# Patient Record
Sex: Female | Born: 1945 | Race: White | Hispanic: No | Marital: Married | Smoking: Never smoker
Health system: Southern US, Community
[De-identification: ages and names within clinical notes are randomized; demographics above are authoritative.]

## PROBLEM LIST (undated history)

## (undated) DIAGNOSIS — K259 Gastric ulcer, unspecified as acute or chronic, without hemorrhage or perforation: Secondary | ICD-10-CM

## (undated) DIAGNOSIS — G43909 Migraine, unspecified, not intractable, without status migrainosus: Secondary | ICD-10-CM

## (undated) DIAGNOSIS — F329 Major depressive disorder, single episode, unspecified: Secondary | ICD-10-CM

## (undated) DIAGNOSIS — F32A Depression, unspecified: Secondary | ICD-10-CM

## (undated) DIAGNOSIS — M1990 Unspecified osteoarthritis, unspecified site: Secondary | ICD-10-CM

## (undated) DIAGNOSIS — E7800 Pure hypercholesterolemia, unspecified: Secondary | ICD-10-CM

## (undated) DIAGNOSIS — I10 Essential (primary) hypertension: Secondary | ICD-10-CM

## (undated) HISTORY — PX: ABDOMINAL HYSTERECTOMY: SHX81

## (undated) HISTORY — PX: ROTATOR CUFF REPAIR: SHX139

## (undated) HISTORY — PX: TRIGGER FINGER RELEASE: SHX641

## (undated) HISTORY — PX: CARPAL TUNNEL RELEASE: SHX101

---

## 2017-05-14 ENCOUNTER — Encounter (HOSPITAL_BASED_OUTPATIENT_CLINIC_OR_DEPARTMENT_OTHER): Payer: Self-pay | Admitting: Emergency Medicine

## 2017-05-14 ENCOUNTER — Emergency Department (HOSPITAL_BASED_OUTPATIENT_CLINIC_OR_DEPARTMENT_OTHER): Payer: Medicare Other

## 2017-05-14 ENCOUNTER — Other Ambulatory Visit: Payer: Self-pay

## 2017-05-14 ENCOUNTER — Emergency Department (HOSPITAL_BASED_OUTPATIENT_CLINIC_OR_DEPARTMENT_OTHER)
Admission: EM | Admit: 2017-05-14 | Discharge: 2017-05-14 | Disposition: A | Discharge status: Home / Self Care | Payer: Medicare Other | Attending: Emergency Medicine | Admitting: Emergency Medicine

## 2017-05-14 DIAGNOSIS — M546 Pain in thoracic spine: Secondary | ICD-10-CM | POA: Diagnosis not present

## 2017-05-14 DIAGNOSIS — I10 Essential (primary) hypertension: Secondary | ICD-10-CM | POA: Diagnosis not present

## 2017-05-14 DIAGNOSIS — Z79899 Other long term (current) drug therapy: Secondary | ICD-10-CM | POA: Diagnosis not present

## 2017-05-14 DIAGNOSIS — M549 Dorsalgia, unspecified: Secondary | ICD-10-CM | POA: Diagnosis present

## 2017-05-14 HISTORY — DX: Migraine, unspecified, not intractable, without status migrainosus: G43.909

## 2017-05-14 HISTORY — DX: Gastric ulcer, unspecified as acute or chronic, without hemorrhage or perforation: K25.9

## 2017-05-14 HISTORY — DX: Essential (primary) hypertension: I10

## 2017-05-14 HISTORY — DX: Depression, unspecified: F32.A

## 2017-05-14 HISTORY — DX: Major depressive disorder, single episode, unspecified: F32.9

## 2017-05-14 HISTORY — DX: Pure hypercholesterolemia, unspecified: E78.00

## 2017-05-14 LAB — BASIC METABOLIC PANEL
Anion gap: 9 (ref 5–15)
BUN: 16 mg/dL (ref 6–20)
CALCIUM: 9.3 mg/dL (ref 8.9–10.3)
CO2: 28 mmol/L (ref 22–32)
CREATININE: 0.69 mg/dL (ref 0.44–1.00)
Chloride: 104 mmol/L (ref 101–111)
GFR calc Af Amer: >60 mL/min (ref >60)
GFR calc non Af Amer: >60 mL/min (ref >60)
GLUCOSE: 112 mg/dL — AB (ref 65–99)
Potassium: 4.4 mmol/L (ref 3.5–5.1)
Sodium: 141 mmol/L (ref 135–145)

## 2017-05-14 LAB — CBC WITH DIFFERENTIAL/PLATELET
BASOS PCT: 1 %
Basophils Absolute: 0 10*3/uL (ref 0.0–0.1)
Eosinophils Absolute: 0.2 10*3/uL (ref 0.0–0.7)
Eosinophils Relative: 4 %
HEMATOCRIT: 41.7 % (ref 36.0–46.0)
Hemoglobin: 14.2 g/dL (ref 12.0–15.0)
Lymphocytes Relative: 22 %
Lymphs Abs: 1.4 10*3/uL (ref 0.7–4.0)
MCH: 31 pg (ref 26.0–34.0)
MCHC: 34.1 g/dL (ref 30.0–36.0)
MCV: 91 fL (ref 78.0–100.0)
MONOS PCT: 7 %
Monocytes Absolute: 0.5 10*3/uL (ref 0.1–1.0)
NEUTROS ABS: 4.2 10*3/uL (ref 1.7–7.7)
Neutrophils Relative %: 67 %
Platelets: 235 10*3/uL (ref 150–400)
RBC: 4.58 MIL/uL (ref 3.87–5.11)
RDW: 13.1 % (ref 11.5–15.5)
WBC: 6.3 10*3/uL (ref 4.0–10.5)

## 2017-05-14 LAB — TROPONIN I: Troponin I: <0.03 ng/mL (ref <0.03)

## 2017-05-14 MED ORDER — ONDANSETRON HCL 4 MG/2ML IJ SOLN
4.0000 mg | Freq: Once | INTRAMUSCULAR | Status: AC
  Administered 2017-05-14: 4 mg via INTRAVENOUS
  Filled 2017-05-14: qty 2

## 2017-05-14 MED ORDER — DIAZEPAM 2 MG PO TABS
2.0000 mg | ORAL_TABLET | Freq: Once | ORAL | Status: AC
  Administered 2017-05-14: 2 mg via ORAL
  Filled 2017-05-14: qty 1

## 2017-05-14 MED ORDER — DICLOFENAC SODIUM 1 % TD GEL: 4.0000 g | Freq: Four times a day (QID) | TRANSDERMAL | 0 refills | Status: DC

## 2017-05-14 MED ORDER — ACETAMINOPHEN 500 MG PO TABS
1000.0000 mg | ORAL_TABLET | Freq: Once | ORAL | Status: AC
  Administered 2017-05-14: 1000 mg via ORAL
  Filled 2017-05-14: qty 2

## 2017-05-14 MED ORDER — IOPAMIDOL (ISOVUE-300) INJECTION 61%: 100.0000 mL | Freq: Once | INTRAVENOUS | Status: DC | PRN

## 2017-05-14 MED ORDER — IOPAMIDOL (ISOVUE-370) INJECTION 76%
100.0000 mL | Freq: Once | INTRAVENOUS | Status: AC | PRN
  Administered 2017-05-14: 100 mL via INTRAVENOUS

## 2017-05-14 MED ORDER — SODIUM CHLORIDE 0.9 % IV BOLUS
1000.0000 mL | Freq: Once | INTRAVENOUS | Status: AC
  Administered 2017-05-14: 1000 mL via INTRAVENOUS

## 2017-05-14 MED ORDER — MORPHINE SULFATE (PF) 4 MG/ML IV SOLN
4.0000 mg | Freq: Once | INTRAVENOUS | Status: AC
  Administered 2017-05-14: 4 mg via INTRAVENOUS
  Filled 2017-05-14: qty 1

## 2017-05-14 MED ORDER — KETOROLAC TROMETHAMINE 15 MG/ML IJ SOLN
15.0000 mg | Freq: Once | INTRAMUSCULAR | Status: AC
  Administered 2017-05-14: 15 mg via INTRAVENOUS
  Filled 2017-05-14: qty 1

## 2017-05-14 MED ORDER — MORPHINE SULFATE (PF) 2 MG/ML IV SOLN
2.0000 mg | Freq: Once | INTRAVENOUS | Status: AC
  Administered 2017-05-14: 2 mg via INTRAVENOUS
  Filled 2017-05-14: qty 1

## 2017-05-14 NOTE — ED Provider Notes (Signed)
[facility] EMERGENCY DEPARTMENT Provider Note   CSN: [id number] Arrival date & time: 05/14/17  4098     History   Chief Complaint Chief Complaint  Patient presents with  . Back Pain    HPI Rachael Callahan is a 72 y.o. female.  72 yo F with a chief complaint of sudden onset right upper back pain.  Nothing seems to make this better or worse.  Describes the pain is sharp and severe.  Feels that it radiates down her right arm to the elbow.  Denies trauma denies fevers denies cough congestion.  Denies shortness of breath diaphoresis nausea or vomiting.  Denies lower extremity edema.  Denies history of the same.  The history is provided by the patient.  Back Pain   This is a new problem. The problem occurs constantly. The problem has not changed since onset.The pain is associated with no known injury. The pain is present in the thoracic spine. The quality of the pain is described as shooting. The pain does not radiate. The pain is at a severity of 10/10. The pain is severe. Pertinent negatives include no chest pain, no fever, no headaches and no dysuria. She has tried heat for the symptoms.    Past Medical History:  Diagnosis Date  . Depression   . High cholesterol   . Hypertension   . Migraine   . Stomach ulcer     There are no active problems to display for this patient.   Past Surgical History:  Procedure Laterality Date  . ABDOMINAL HYSTERECTOMY    . CARPAL TUNNEL RELEASE    . ROTATOR CUFF REPAIR       OB History   None      Home Medications    Prior to Admission medications   Medication Sig Start Date End Date Taking? Authorizing Provider  ARIPiprazole (ABILIFY) 5 MG tablet Take 5 mg by mouth daily.   Yes [provider]  metoprolol tartrate (LOPRESSOR) 25 MG tablet Take 25 mg by mouth 2 (two) times daily.   Yes [provider]  UNABLE TO FIND [employer]   Yes [provider]    Family History No family history on  file.  Social History Social History   Tobacco Use  . Smoking status: Never Smoker  . Smokeless tobacco: Never Used  Substance Use Topics  . Alcohol use: Yes    Comment: rarely  . Drug use: Never     Allergies   Patient has no known allergies.   Review of Systems Review of Systems  Constitutional: Negative for chills and fever.  HENT: Negative for congestion and rhinorrhea.   Eyes: Negative for redness and visual disturbance.  Respiratory: Negative for shortness of breath and wheezing.   Cardiovascular: Negative for chest pain and palpitations.  Gastrointestinal: Negative for nausea and vomiting.  Genitourinary: Negative for dysuria and urgency.  Musculoskeletal: Positive for back pain. Negative for arthralgias and myalgias.  Skin: Negative for pallor and wound.  Neurological: Negative for dizziness and headaches.     Physical Exam Updated Vital Signs BP (!) 158/83 (BP Location: Right Arm)   Pulse 63   Temp 98 F (36.7 C) (Oral)   Resp 18   Ht 5\' 6"  (1.676 m)   Wt 86.2 kg (190 lb)   SpO2 98%   BMI 30.67 kg/m   Physical Exam  Constitutional: She is oriented to person, place, and time. She appears well-developed and well-nourished. No distress.  HENT:  Head: Normocephalic  and atraumatic.  Eyes: Pupils are equal, round, and reactive to light. EOM are normal.  Neck: Normal range of motion. Neck supple.  Cardiovascular: Normal rate and regular rhythm. Exam reveals no gallop and no friction rub.  No murmur heard. Pulmonary/Chest: Effort normal. She has no wheezes. She has no rales.  Abdominal: Soft. She exhibits no distension. There is no tenderness.  Musculoskeletal: She exhibits no edema or tenderness.  The patient points to the area about T7.  There is no focal tenderness on palpation.  No pain with range of motion of the right shoulder.  Negative Spurling's, no midline cervical tenderness.  Pulse motor and sensation is intact to the right upper extremity   Neurological: She is alert and oriented to person, place, and time.  Skin: Skin is warm and dry. She is not diaphoretic.  Psychiatric: She has a normal mood and affect. Her behavior is normal.  Nursing note and vitals reviewed.    ED Treatments / Results  Labs (all labs ordered are listed, but only abnormal results are displayed) Labs Reviewed  BASIC METABOLIC PANEL - Abnormal; Notable for the following components:      Result Value   Glucose, Bld 112 (*)    All other components within normal limits  CBC WITH DIFFERENTIAL/PLATELET  TROPONIN I    EKG EKG Interpretation  Date/Time:  Thursday May 14 2017 08:53:33 EDT Ventricular Rate:  62 PR Interval:    QRS Duration: 91 QT Interval:  417 QTC Calculation: 424 R Axis:   6 Text Interpretation:  Sinus rhythm No old tracing to compare Confirmed by Melene PlanFloyd, Taila Basinski [phone]) on 05/14/2017 8:58:50 AM   Radiology No results found.  Procedures Procedures (including critical care time)  Medications Ordered in ED Medications  sodium chloride 0.9 % bolus 1,000 mL (1,000 mLs Intravenous New Bag/Given 05/14/17 0916)  morphine 2 MG/ML injection 2 mg (2 mg Intravenous Given 05/14/17 0923)  ondansetron (ZOFRAN) injection 4 mg (4 mg Intravenous Given 05/14/17 0923)  diazepam (VALIUM) tablet 2 mg (2 mg Oral Given 05/14/17 [id number])  acetaminophen (TYLENOL) tablet 1,000 mg (1,000 mg Oral Given 05/14/17 0921)  iopamidol (ISOVUE-370) 76 % injection 100 mL (100 mLs Intravenous Contrast Given 05/14/17 0949)     Initial Impression / Assessment and Plan / ED Course  I have reviewed the triage vital signs and the nursing notes.  Pertinent labs & imaging results that were available during my care of the patient were reviewed by me and considered in my medical decision making (see chart for details).     72 yo F with sudden onset severe back pain.  This is in the mid to upper T-spine.  The patient has no neurologic deficit.  I suspect that this is most  likely muscular skeletal based on its location however the patient is adamant that is not reproduced with palpation or twisting and based on her advanced age I will obtain a CT scan to evaluate for dissection or intrathoracic pathology.  CT scan is negative.  Patient feeling better after symptomatic therapy.  Will start on Voltaren gel Tylenol follow-up with PCP.  11:32 AM:  I have discussed the diagnosis/risks/treatment options with the patient and family and believe the pt to be eligible for discharge home to follow-up with PCP. We also discussed returning to the ED immediately if new or worsening sx occur. We discussed the sx which are most concerning (e.g., sudden worsening pain, fever, inability to tolerate by mouth) that necessitate immediate return. Medications administered  to the patient during their visit and any new prescriptions provided to the patient are listed below.  Medications given during this visit Medications  sodium chloride 0.9 % bolus 1,000 mL (0 mLs Intravenous Stopped 05/14/17 1111)  morphine 2 MG/ML injection 2 mg (2 mg Intravenous Given 05/14/17 0923)  ondansetron (ZOFRAN) injection 4 mg (4 mg Intravenous Given 05/14/17 0923)  diazepam (VALIUM) tablet 2 mg (2 mg Oral Given 05/14/17 1610)  acetaminophen (TYLENOL) tablet 1,000 mg (1,000 mg Oral Given 05/14/17 0921)  iopamidol (ISOVUE-370) 76 % injection 100 mL (100 mLs Intravenous Contrast Given 05/14/17 0949)  morphine 4 MG/ML injection 4 mg (4 mg Intravenous Given 05/14/17 1047)  ketorolac (TORADOL) 15 MG/ML injection 15 mg (15 mg Intravenous Given 05/14/17 1111)    Labs reviewed cbc, bmp, trop reassuring Images reviewed CT without acute finding as viewed by me DDX dissection, PE, epidural asbcess, disc herniation Old records reviewed hx of htn, hypothyroid  The patient appears reasonably screen and/or stabilized for discharge and I doubt any other medical condition or other [facility] requiring further screening, evaluation, or  treatment in the ED at this time prior to discharge.    Final Clinical Impressions(s) / ED Diagnoses   Final diagnoses:  Thoracic back pain    ED Discharge Orders    None       Melene Plan, DO 05/14/17 1134

## 2017-05-14 NOTE — ED Triage Notes (Signed)
Sudden onset 2 hours ago of back pain medial to right scapula radiating down right arm to elbow.  Some nausea.  Pt tearful.  No known injury.

## 2017-05-14 NOTE — Discharge Instructions (Signed)
Take tylenol 1000mg (2 extra strength) four times a day.   Ask your doctor and your hand surgeon if you can take NSAIDs

## 2017-07-15 ENCOUNTER — Encounter (HOSPITAL_COMMUNITY): Payer: Self-pay | Admitting: Psychiatry

## 2017-07-15 ENCOUNTER — Ambulatory Visit (INDEPENDENT_AMBULATORY_CARE_PROVIDER_SITE_OTHER): Payer: Medicare Other | Admitting: Psychiatry

## 2017-07-15 VITALS — BP 132/80 | HR 68 | Ht 66.0 in | Wt 182.0 lb

## 2017-07-15 DIAGNOSIS — G4700 Insomnia, unspecified: Secondary | ICD-10-CM | POA: Diagnosis not present

## 2017-07-15 DIAGNOSIS — F332 Major depressive disorder, recurrent severe without psychotic features: Secondary | ICD-10-CM | POA: Diagnosis not present

## 2017-07-15 DIAGNOSIS — R413 Other amnesia: Secondary | ICD-10-CM

## 2017-07-15 NOTE — Progress Notes (Signed)
Manuel Garcia MD/PA/NP OP [place] consult Note  07/15/2017 11:16 AM Rachael Callahan  MRN:  [id number]  Chief Complaint: [place] consult HPI: Rachael Callahan presents from referral from [facility] for [place] consult.  I spent time with her and her husband reviewing her psychiatric history and symptoms which are consistent with severe recurrent major depressive disorder.  She has not had any suicidality or psychiatric hospitalizations but has had passive thoughts about death and dying.  She feels like a burden to her husband at times even though she knows intellectually this is not the case.  She retired about 1 year ago and has had some struggles with the transition.  She has a good connection and support system with her friends and family but feels like she is not able to enjoy activities she typically likes.  Her depressive symptoms have persisted for the past 6-12 months without relief.  She has been tried on a myriad of medications for depression and is currently on Cymbalta and Latuda.  I spent time with her reviewing the risks and benefits of [place], and reviewed medical contraindications.  She has no metallic implants or other objects in her brain, neck, mouth that would preclude TMS to be safely administered.  I reviewed the risk of 1 out of 1000 patients may experience a seizure.  I reviewed the common complaints including pain at the treatment site, and neurological symptoms.  She wishes to proceed with [place] mapping and treatment, and we agreed to schedule for July 5 at 11 AM.  She will continue on the current regimen as prescribed and be [place] the next [place] the beach with [place].  She typically enjoys these types of trips but finds herself dreading the socialization, but knows that it is important for her family that she makes a trip.  Visit Diagnosis:    ICD-10-CM   1. Severe episode of recurrent major depressive disorder, without psychotic features ([place]) F33.2     Past Psychiatric History: Multiple  medical trials of medications for depression, no history of ECT or TMS, no psychiatric hospitalizations  Past Medical History:  Past Medical History:  Diagnosis Date  . Depression   . High cholesterol   . Hypertension   . Migraine   . Stomach ulcer     Past Surgical History:  Procedure Laterality Date  . ABDOMINAL HYSTERECTOMY    . CARPAL TUNNEL RELEASE    . ROTATOR CUFF REPAIR    . TRIGGER FINGER RELEASE      Family Psychiatric History: Her father has a history of severe depression and was treated with ECT  Family History: History reviewed. No pertinent family history.  Social History:  Social History   Socioeconomic History  . Marital status: Married    Spouse name: Not on file  . Number of children: Not on file  . Years of education: Not on file  . Highest education level: Not on file  Occupational History  . Not on file  Social Needs  . Financial resource strain: Not on file  . Food insecurity:    Worry: Not on file    Inability: Not on file  . Transportation needs:    Medical: Not on file    Non-medical: Not on file  Tobacco Use  . Smoking status: Never Smoker  . Smokeless tobacco: Never Used  Substance and Sexual Activity  . Alcohol use: Yes    Comment: rarely  . Drug use: Never  . Sexual activity: Not on file  Lifestyle  . Physical activity:    Days per week: Not on file    Minutes per session: Not on file  . Stress: Not on file  Relationships  . Social connections:    Talks on phone: Not on file    Gets together: Not on file    Attends religious service: Not on file    Active member of club or organization: Not on file    Attends meetings of clubs or organizations: Not on file    Relationship status: Not on file  Other Topics Concern  . Not on file  Social History Narrative  . Not on file    Allergies: No Known Allergies  Metabolic Disorder Labs: No results found for: HGBA1C, MPG No results found for: PROLACTIN No results found for:  CHOL, TRIG, HDL, CHOLHDL, VLDL, LDLCALC No results found for: TSH  Therapeutic Level Labs: No results found for: LITHIUM No results found for: VALPROATE No components found for:  CBMZ  Current Medications: Current Outpatient Medications  Medication Sig Dispense Refill  . ALPRAZolam (XANAX) 0.25 MG tablet Take 0.25 mg by mouth as needed.    . Cholecalciferol (D 1000) 1000 units capsule Take by mouth.    . DULoxetine (CYMBALTA) 20 MG capsule Take 60 mg by mouth daily.  0  . LATUDA 40 MG TABS tablet TAKE 1 TABLET EVERY EVENING WITH FOOD  0  . metoprolol tartrate (LOPRESSOR) 25 MG tablet Take 25 mg by mouth 2 (two) times daily.    . Multiple Vitamins-Minerals (MULTIVITAMIN ADULT PO) Take by mouth.    . Probiotic Product (PROBIOTIC-10 PO) Take by mouth.    . ARIPiprazole (ABILIFY) 5 MG tablet Take 5 mg by mouth daily.    . diclofenac sodium (VOLTAREN) 1 % GEL Apply 4 g topically 4 (four) times daily. (Patient not taking: Reported on 07/15/2017) 100 g 0   No current facility-administered medications for this visit.      Musculoskeletal: Strength & Muscle Tone: within normal limits Gait & Station: normal Patient leans: N/A  Psychiatric Specialty Exam: Review of Systems  Constitutional: Negative.   HENT: Negative.   Respiratory: Negative.   Cardiovascular: Negative.   Gastrointestinal: Negative.   Musculoskeletal: Negative.   Neurological: Negative.   Psychiatric/Behavioral: Positive for depression and memory loss. Negative for hallucinations, substance abuse and suicidal ideas. The patient has insomnia. The patient is not nervous/anxious.     Blood pressure 132/80, pulse 68, height '5\' 6"'  (1.676 m), weight 182 lb (82.6 kg).Body mass index is 29.38 kg/m.  General Appearance: Casual and Well Groomed  Eye Contact:  Good  Speech:  Clear and Coherent and Normal Rate  Volume:  Normal  Mood:  Depressed and Dysphoric  Affect:  Constricted, Depressed and Tearful  Thought Process:  Goal  Directed and Descriptions of Associations: Intact  Orientation:  Full (Time, Place, and Person)  Thought Content: Logical   Suicidal Thoughts:  No  Homicidal Thoughts:  No  Memory:  Immediate;   Good  Judgement:  Good  Insight:  Good  Psychomotor Activity:  Normal  Concentration:  Concentration: Good  Recall:  Good  Fund of Knowledge: Good  Language: Good  Akathisia:  Negative  Handed:  Right  AIMS (if indicated): not done  Assets:  Communication Skills Desire for Improvement Financial Resources/Insurance Housing  ADL's:  Intact  Cognition: WNL  Sleep:  Poor   Screenings:   Assessment and Plan: Rachael Callahan presents with symptoms consistent with severe recurrent major depressive  disorder, presenting with predominantly neurovegetative symptoms.  She presents with features of melancholic depression.  She has been tried and failed multiple antidepressants over the years.  She is managed by [facility] for her medication needs.  She has excellent support from her family and friends, and presents as a fairly resilient individual, but has been felt discouraged and feels like a burden to her husband given how depressed she has been over the past year.  We agreed to proceed with [place] and reviewed the common complaints and risk of seizure.  Spent time with her and her husband educating them on the modality of action of [place], and the 36 treatments recommended.  She has no acute suicidality or safety concerns.  She does not engage in any substance abuse.  We agreed to proceed as below and we will follow-up in August for a [place] posttreatment follow-up.  1. Severe episode of recurrent major depressive disorder, without psychotic features ([place])     Status of current problems: new to [employer] Ordered: No orders of the defined types were placed in this encounter.   Labs Reviewed: n/a  Collateral Obtained/Records Reviewed: husband is able to corroborate that the patient has had  severe depressive symptoms over the past 6-12 months, and failed multiple trials, no concerns about acute safety issues or suicidality.  Plan:  Proceed with [place] mapping and treatment Schedule for July 5 at [place], MD 07/15/2017, 11:16 AM

## 2017-07-31 ENCOUNTER — Other Ambulatory Visit (HOSPITAL_COMMUNITY): Payer: Medicare Other | Attending: Psychiatry | Admitting: Psychiatry

## 2017-07-31 DIAGNOSIS — F332 Major depressive disorder, recurrent severe without psychotic features: Secondary | ICD-10-CM | POA: Diagnosis present

## 2017-07-31 NOTE — Progress Notes (Signed)
[facility] for cortical mapping and motor threshold determination for Repetitive Transcranial Magnetic Stimulation treatment for Major Depressive Disorder. Pt completed a PHQ-9 with a score of 24(severe depression). Pt also completed a Beck's Depression Inventory with a score of 33 (severe depression). Prior to procedure, pt signed an informed consent agreement for TMS treatment. Pt's treatment area was found by applying single pulses to her left motor cortex, hunting along the anterior/posterior plane and along the superior oblique angle until the best motor response was elicited from the pt's right thumb. The best response was observed at 5.5cm A/P and 30 degrees SOA, with a coil angle of 10 degrees. Pt's motor threshold was calculated using the [employer]'s proprietary MT Assist algorithm, which produced a calculated motor threshold of 1.30 SMT. Per these findings, pt's treatment parameters are as follows: A/P -- 11 cm, SOA -- 30 degrees, Coil Angle -- 10 degrees, Motor Threshold -- 1.18 SMT. With these parameters, the pt will receive 36 sessions of TMS according to the following protocol: 3000 pulses per session, with stimulation in bursts of pulses lasting 4 seconds at a frequency of 10 Hz, separated by 26 seconds of rest. After determining pt's tx parameters, coil was moved to the treatment location, and the first burst of pulses was applied at a reduced power of 80%MT. Pt reported no complaints, and stated that the stimulation was tolerable. Upon completion of mapping, pt completed a few treatment intervals for observation of side effects. Pt tolerated tx well. Pt departed from clinic without issue.

## 2017-08-03 ENCOUNTER — Other Ambulatory Visit (INDEPENDENT_AMBULATORY_CARE_PROVIDER_SITE_OTHER): Payer: Medicare Other | Admitting: Emergency Medicine

## 2017-08-03 DIAGNOSIS — F332 Major depressive disorder, recurrent severe without psychotic features: Secondary | ICD-10-CM

## 2017-08-03 NOTE — Progress Notes (Signed)
[facility] for Repetitive Transcranial Magnetic Stimulation treatment for severe episode of recurrent major depressive disorder, without psychotic features. Patient presented with appropriate affect, level mood and denied any suicidal or homicidal ideations. Patient denies any other current symptoms and remains optimistic with continued TMS treatment. Patient reported no change in alcohol/substane use, caffeine consumption, sleep pattern or metal implant status since previous treatment. Ptpresented withpleasantaffect to txsession with her best friend. She shared that she had a relaxful weekend and has been busy planning a baby shower. She spoke with best friend and Clinical research associatewriter during tx session.Power levelremained at 120% for the duration of tx sesion.Patient reported nocomplaints anddiscomfort during tx.Patientand best friend departed post-treatment with no majorreported concerns.

## 2017-08-04 ENCOUNTER — Other Ambulatory Visit (HOSPITAL_COMMUNITY): Payer: Medicare Other | Admitting: Emergency Medicine

## 2017-08-04 DIAGNOSIS — F332 Major depressive disorder, recurrent severe without psychotic features: Secondary | ICD-10-CM

## 2017-08-04 NOTE — Progress Notes (Signed)
[facility] for Repetitive Transcranial Magnetic Stimulation treatment for severe episode of recurrent major depressive disorder, without psychotic features. Patient presented with appropriate affect, level mood and denied any suicidal or homicidal ideations. Patient denies any other current symptoms and remains optimistic with continued TMS treatment. Patient reported no change in alcohol/substane use, caffeine consumption, sleep pattern or metal implant status since previous treatment. Ptpresented withpleasantaffect to txsession with her husband. She took the time to mediate during tx session.Power levelremained at 120% for the duration of tx sesion.Patient reported nocomplaints anddiscomfort during tx.Patientand husband departed post-treatment with no majorreported concerns.

## 2017-08-05 ENCOUNTER — Other Ambulatory Visit (HOSPITAL_COMMUNITY): Payer: Medicare Other | Admitting: Emergency Medicine

## 2017-08-05 DIAGNOSIS — F332 Major depressive disorder, recurrent severe without psychotic features: Secondary | ICD-10-CM

## 2017-08-05 NOTE — Progress Notes (Signed)
[facility] for Repetitive Transcranial Magnetic Stimulation treatment for severe episode of recurrent major depressive disorder, without psychotic features. Patient presented with appropriate affect, level mood and denied any suicidal or homicidal ideations. Patient denies any other current symptoms and remains optimistic with continued TMS treatment. Patient reported no change in alcohol/substane use, caffeine consumption, sleep pattern or metal implant status since previous treatment. Ptpresented withpleasantaffect to txsession. She watched TV and spoke with Clinical research associatewriter periodically. Power levelremained at 120% for the duration of tx sesion.Patient reported nocomplaints anddiscomfort during tx.Patientdeparted post-treatment with no majorreported concerns.

## 2017-08-06 ENCOUNTER — Other Ambulatory Visit (HOSPITAL_COMMUNITY): Payer: Medicare Other | Admitting: Emergency Medicine

## 2017-08-06 DIAGNOSIS — F332 Major depressive disorder, recurrent severe without psychotic features: Secondary | ICD-10-CM

## 2017-08-06 NOTE — Progress Notes (Signed)
[facility] for Repetitive Transcranial Magnetic Stimulation treatment for severe episode of recurrent major depressive disorder, without psychotic features. Patient presented with appropriate affect, level mood and denied any suicidal or homicidal ideations. Patient denies any other current symptoms and remains optimistic with continued TMS treatment. Patient reported no change in alcohol/substane use, caffeine consumption, sleep pattern or metal implant status since previous treatment. Ptpresented withpleasantaffect to txsession. She shared information about her grand daughter. Her son is in the process of adopting a baby girl - everything is slowly getting finalized. She has been busy planning the baby shower the past couple of days. Power levelremained at 120% for the duration of tx sesion.Patient reported nocomplaints anddiscomfort during tx.Patientdeparted post-treatment with no majorreported concerns.

## 2017-08-07 ENCOUNTER — Other Ambulatory Visit (HOSPITAL_COMMUNITY): Payer: Medicare Other | Admitting: Emergency Medicine

## 2017-08-07 DIAGNOSIS — F332 Major depressive disorder, recurrent severe without psychotic features: Secondary | ICD-10-CM

## 2017-08-07 NOTE — Progress Notes (Signed)
Patient reported to [facility] for Repetitive Transcranial Magnetic Stimulation treatment for severe episode of recurrent major depressive disorder, without psychotic features. Patient presented with appropriate affect, level mood and denied any suicidal or homicidal ideations. Patient denies any other current symptoms and remains optimistic with continued [place] treatment. Patient reported no change in alcohol/substane use, caffeine consumption, sleep pattern or metal implant status since previous treatment. Ptpresented withpleasantaffect to txsession. She shared that she met her grand daughter and spent time with the family. She has been anxious about the adopting process her son has been dealing with the last few days. Power levelremained at 120% for the duration of tx sesion.Patient reported nocomplaints anddiscomfort during tx.Patientand husbanddeparted post-treatment with no majorreported concerns.

## 2017-08-10 ENCOUNTER — Other Ambulatory Visit (HOSPITAL_COMMUNITY): Payer: Medicare Other | Admitting: Emergency Medicine

## 2017-08-10 DIAGNOSIS — F332 Major depressive disorder, recurrent severe without psychotic features: Secondary | ICD-10-CM

## 2017-08-10 NOTE — Progress Notes (Signed)
[facility] for Repetitive Transcranial Magnetic Stimulation treatment for severe episode of recurrent major depressive disorder, without psychotic features. Patient presented with appropriate affect, level mood and denied any suicidal or homicidal ideations. Patient denies any other current symptoms and remains optimistic with continued TMS treatment. Patient reported no change in alcohol/substane use, caffeine consumption, sleep pattern or metal implant status since previous treatment. Ptpresented withpleasantaffect to txsession. She hosted the [employer] and attended the baptism for her grandaughter this weekend. She had a great time spending time with family. She spoke with Clinical research associatewriter and periodically watched TV during tx session. Power levelremained at 120% for the duration of tx sesion.Patient reported nocomplaints anddiscomfort during tx.Patientdeparted post-treatment with no majorreported concerns.

## 2017-08-11 ENCOUNTER — Other Ambulatory Visit (INDEPENDENT_AMBULATORY_CARE_PROVIDER_SITE_OTHER): Payer: Medicare Other

## 2017-08-11 DIAGNOSIS — F332 Major depressive disorder, recurrent severe without psychotic features: Secondary | ICD-10-CM | POA: Diagnosis not present

## 2017-08-11 NOTE — Progress Notes (Signed)
[facility] for Repetitive Transcranial Magnetic Stimulation treatment for severe episode of recurrent major depressive disorder, without psychotic features. Patient presented with appropriate affect, level mood and denied any suicidal or homicidal ideations. Patient denies any other current symptoms and remains optimistic with continued TMS treatment. Patient reported no change in alcohol/substane use, caffeine consumption, sleep pattern or metal implant status since previous treatment. Ptpresented withpleasantaffect to txsession. Pt watched television and engaged in conversation during tx.Power levelremained at 120% for the duration of tx sesion.Patient reported nocomplaints anddiscomfort during tx.Patientdeparted post-treatment with no majorreported concerns.

## 2017-08-12 ENCOUNTER — Other Ambulatory Visit (INDEPENDENT_AMBULATORY_CARE_PROVIDER_SITE_OTHER): Payer: Medicare Other | Admitting: Emergency Medicine

## 2017-08-12 DIAGNOSIS — F332 Major depressive disorder, recurrent severe without psychotic features: Secondary | ICD-10-CM

## 2017-08-12 NOTE — Progress Notes (Signed)
[facility] for Repetitive Transcranial Magnetic Stimulation treatment for severe episode of recurrent major depressive disorder, without psychotic features. Patient presented with appropriate affect, level mood and denied any suicidal or homicidal ideations. Patient denies any other current symptoms and remains optimistic with continued TMS treatment. Patient reported no change in alcohol/substane use, caffeine consumption, sleep pattern or metal implant status since previous treatment. Ptpresented withpleasantaffect to txsession. She spoke with Clinical research associatewriter and periodically watched TV during tx session.Power levelremained at 120% for the duration of tx sesion.Patient reported nocomplaints anddiscomfort during tx.Patientdeparted post-treatment with no majorreported concerns.

## 2017-08-13 ENCOUNTER — Other Ambulatory Visit (INDEPENDENT_AMBULATORY_CARE_PROVIDER_SITE_OTHER): Payer: Medicare Other | Admitting: Emergency Medicine

## 2017-08-13 DIAGNOSIS — F332 Major depressive disorder, recurrent severe without psychotic features: Secondary | ICD-10-CM | POA: Diagnosis not present

## 2017-08-13 NOTE — Progress Notes (Signed)
Patient reported to [facility] for Repetitive Transcranial Magnetic Stimulation treatment for severe episode of recurrent major depressive disorder, without psychotic features. Patient presented with appropriate affect, level mood and denied any suicidal or homicidal ideations. Patient denies any other current symptoms and remains optimistic with continued TMS treatment. Patient reported no change in alcohol/substane use, caffeine consumption, sleep pattern or metal implant status since previous treatment. Ptpresented withpleasantaffect to txsession. She spoke with writer and periodically watched TV during tx session.Power levelremained at 120% for the duration of tx sesion.Patient reported nocomplaints anddiscomfort during tx.Patientdeparted post-treatment with no majorreported concerns.  

## 2017-08-14 ENCOUNTER — Other Ambulatory Visit (INDEPENDENT_AMBULATORY_CARE_PROVIDER_SITE_OTHER): Payer: Medicare Other | Admitting: Emergency Medicine

## 2017-08-14 ENCOUNTER — Ambulatory Visit (HOSPITAL_COMMUNITY): Payer: Self-pay

## 2017-08-14 DIAGNOSIS — F332 Major depressive disorder, recurrent severe without psychotic features: Secondary | ICD-10-CM

## 2017-08-14 NOTE — Progress Notes (Signed)
[facility] for Repetitive Transcranial Magnetic Stimulation treatment for severe episode of recurrent major depressive disorder, without psychotic features. Patient presented with appropriate affect, level mood and denied any suicidal or homicidal ideations. Patient denies any other current symptoms and remains optimistic with continued TMS treatment. Patient reported no change in alcohol/substane use, caffeine consumption, sleep pattern or metal implant status since previous treatment. Ptpresented withpleasantaffect to txsession. She shared that she will be spending time with her grandchildren this weekend. She spoke with Clinical research associatewriter and periodically watched TV during tx session.Power levelremained at 120% for the duration of tx sesion.Patient reported nocomplaints anddiscomfort during tx.Patientdeparted post-treatment with no majorreported concerns.

## 2017-08-17 ENCOUNTER — Other Ambulatory Visit (INDEPENDENT_AMBULATORY_CARE_PROVIDER_SITE_OTHER): Payer: Medicare Other | Admitting: Emergency Medicine

## 2017-08-17 DIAGNOSIS — F332 Major depressive disorder, recurrent severe without psychotic features: Secondary | ICD-10-CM | POA: Diagnosis not present

## 2017-08-17 NOTE — Progress Notes (Signed)
[facility] Repetitive Transcranial Magnetic Stimulation treatment for severe episode of recurrent major depressive disorder, without psychotic features. Patient presented with appropriate affect, level mood and denied any suicidal or homicidal ideations. Patient denies any other current symptoms and remains optimistic with continued TMS treatment. Patient reported no change in alcohol/substane use, caffeine consumption, sleep pattern or metal implant status since previous treatment. Ptpresented withpleasantaffect to txsession. She spoke with Clinical research associatewriter and periodically watched TV during tx session. She had a great weekend with her grand kids, which is her coping mechanism for her depression.Power levelremained at 120% for the duration of tx sesion.Patient reported nocomplaints anddiscomfort during tx.Patientdeparted post-treatment with no majorreported concerns.

## 2017-08-18 ENCOUNTER — Other Ambulatory Visit (INDEPENDENT_AMBULATORY_CARE_PROVIDER_SITE_OTHER): Payer: Medicare Other | Admitting: Emergency Medicine

## 2017-08-18 DIAGNOSIS — F332 Major depressive disorder, recurrent severe without psychotic features: Secondary | ICD-10-CM

## 2017-08-18 NOTE — Progress Notes (Signed)
[facility] Repetitive Transcranial Magnetic Stimulation treatment for severe episode of recurrent major depressive disorder, without psychotic features. Patient presented with appropriate affect, level mood and denied any suicidal or homicidal ideations. Patient denies any other current symptoms and remains optimistic with continued TMS treatment. Patient reported no change in alcohol/substane use, caffeine consumption, sleep pattern or metal implant status since previous treatment. Ptpresented withpleasantaffect to txsession. She shared stories about her grand kids. She plans on taking them to the bookstore this afternoon. She spoke to Clinical research associatewriter during tx session.Power levelremained at 120% for the duration of tx sesion.Patient reported nocomplaints anddiscomfort during tx.Patientdeparted post-treatment with no majorreported concerns.

## 2017-08-19 ENCOUNTER — Other Ambulatory Visit (INDEPENDENT_AMBULATORY_CARE_PROVIDER_SITE_OTHER): Payer: Medicare Other | Admitting: Emergency Medicine

## 2017-08-19 DIAGNOSIS — F332 Major depressive disorder, recurrent severe without psychotic features: Secondary | ICD-10-CM

## 2017-08-19 NOTE — Progress Notes (Signed)
Patient reported to [facility] for Repetitive Transcranial Magnetic Stimulation treatment for severe episode of recurrent major depressive disorder, without psychotic features. Patient presented with appropriate affect, level mood and denied any suicidal or homicidal ideations. Patient denies any other current symptoms and remains optimistic with continued TMS treatment. Patient reported no change in alcohol/substane use, caffeine consumption, sleep pattern or metal implant status since previous treatment. Ptpresented withpleasantaffect to txsession. She spoke with Clinical research associatewriter and periodically watched TV during tx session. She has been having a great time with her grand kids the past couple of days.Power levelremained at 120% for the duration of tx sesion.Patient reported nocomplaints anddiscomfort during tx.Patientdeparted post-treatment with no majorreported concerns.

## 2017-08-20 ENCOUNTER — Other Ambulatory Visit (INDEPENDENT_AMBULATORY_CARE_PROVIDER_SITE_OTHER): Payer: Medicare Other | Admitting: Emergency Medicine

## 2017-08-20 DIAGNOSIS — F332 Major depressive disorder, recurrent severe without psychotic features: Secondary | ICD-10-CM

## 2017-08-20 NOTE — Progress Notes (Signed)
[facility] for Repetitive Transcranial Magnetic Stimulation treatment for severe episode of recurrent major depressive disorder, without psychotic features. Patient presented with appropriate affect, level mood and denied any suicidal or homicidal ideations. Patient denies any other current symptoms and remains optimistic with continued TMS treatment. Patient reported no change in alcohol/substane use, caffeine consumption, sleep pattern or metal implant status since previous treatment. Ptpresented withpleasantaffect to txsession. She made a list of all the places she will like to take her grand kids.She has been having a great time with her grand kids the past couple of days.Power levelremained at 120% for the duration of tx sesion.Patient reported nocomplaints anddiscomfort during tx.Patientdeparted post-treatment with no majorreported concerns.

## 2017-08-21 ENCOUNTER — Other Ambulatory Visit (INDEPENDENT_AMBULATORY_CARE_PROVIDER_SITE_OTHER): Payer: Medicare Other | Admitting: Emergency Medicine

## 2017-08-21 ENCOUNTER — Encounter (HOSPITAL_COMMUNITY): Payer: Self-pay

## 2017-08-21 DIAGNOSIS — F332 Major depressive disorder, recurrent severe without psychotic features: Secondary | ICD-10-CM

## 2017-08-21 NOTE — Progress Notes (Signed)
[facility] for Repetitive Transcranial Magnetic Stimulation treatment for severe episode of recurrent major depressive disorder, without psychotic features. Patient presented with appropriate affect, level mood and denied any suicidal or homicidal ideations. Patient denies any other current symptoms and remains optimistic with continued TMS treatment. Patient reported no change in alcohol/substane use, caffeine consumption, sleep pattern or metal implant status since previous treatment. Ptpresented withpleasantaffect to txsession. She shared that she had to take her grand daughter to the urgent care. She injured herself at the pool. She spent the last 12 hours getting the grand kids. She watched TV and periodically talked to Clinical research associatewriter.Power levelremained at 120% for the duration of tx sesion.Patient reported nocomplaints anddiscomfort during tx.Patientdeparted post-treatment with no majorreported concerns.

## 2017-08-24 ENCOUNTER — Other Ambulatory Visit (INDEPENDENT_AMBULATORY_CARE_PROVIDER_SITE_OTHER): Payer: Medicare Other | Admitting: Emergency Medicine

## 2017-08-24 DIAGNOSIS — F332 Major depressive disorder, recurrent severe without psychotic features: Secondary | ICD-10-CM

## 2017-08-24 NOTE — Progress Notes (Signed)
[facility] for Repetitive Transcranial Magnetic Stimulation treatment for severe episode of recurrent major depressive disorder, without psychotic features. Patient presented with appropriate affect, level mood and denied any suicidal or homicidal ideations. Patient denies any other current symptoms and remains optimistic with continued TMS treatment. Patient reported no change in alcohol/substane use, caffeine consumption, sleep pattern or metal implant status since previous treatment. Ptpresented withpleasantaffect to txsession. She shared she spent the entire weekend with her grand kids. She watched TV and periodically talked to Clinical research associatewriter.Power levelremained at 120% for the duration of tx sesion.Patient reported nocomplaints anddiscomfort during tx.Patientdeparted post-treatment with no majorreported concerns.

## 2017-08-25 ENCOUNTER — Other Ambulatory Visit (INDEPENDENT_AMBULATORY_CARE_PROVIDER_SITE_OTHER): Payer: Medicare Other | Admitting: Emergency Medicine

## 2017-08-25 DIAGNOSIS — F332 Major depressive disorder, recurrent severe without psychotic features: Secondary | ICD-10-CM

## 2017-08-25 NOTE — Progress Notes (Signed)
[facility] Repetitive Transcranial Magnetic Stimulation treatment for severe episode of recurrent major depressive disorder, without psychotic features. Patient presented with appropriate affect, level mood and denied any suicidal or homicidal ideations. Patient denies any other current symptoms and remains optimistic with continued TMS treatment. Patient reported no change in alcohol/substane use, caffeine consumption, sleep pattern or metal implant status since previous treatment. Ptpresented withpleasantaffect to txsession. She had to send off her grand kids this morning. She expressed that she feels down from their absence. She watched TV and periodically talked to Clinical research associatewriter.Power levelremained at 120% for the duration of tx sesion.Patient reported nocomplaints anddiscomfort during tx.Patientdeparted post-treatment with no majorreported concerns.

## 2017-08-26 ENCOUNTER — Other Ambulatory Visit (INDEPENDENT_AMBULATORY_CARE_PROVIDER_SITE_OTHER): Payer: Medicare Other | Admitting: Emergency Medicine

## 2017-08-26 DIAGNOSIS — F332 Major depressive disorder, recurrent severe without psychotic features: Secondary | ICD-10-CM | POA: Diagnosis not present

## 2017-08-26 NOTE — Progress Notes (Signed)
[facility] Repetitive Transcranial Magnetic Stimulation treatment for severe episode of recurrent major depressive disorder, without psychotic features. Patient presented with appropriate affect, level mood and denied any suicidal or homicidal ideations. Patient denies any other current symptoms and remains optimistic with continued TMS treatment. Patient reported no change in alcohol/substane use, caffeine consumption, sleep pattern or metal implant status since previous treatment. Ptpresented withpleasantaffect to txsession.She has been running errands all morning and is ready to go home to relax.She watched TV and periodically talked to Clinical research associatewriter.Power levelremained at 120% for the duration of tx sesion.Patient reported nocomplaints anddiscomfort during tx.Patientdeparted post-treatment with no majorreported concerns.

## 2017-08-27 ENCOUNTER — Other Ambulatory Visit (HOSPITAL_COMMUNITY): Payer: Medicare Other | Attending: Psychiatry | Admitting: Emergency Medicine

## 2017-08-27 DIAGNOSIS — F332 Major depressive disorder, recurrent severe without psychotic features: Secondary | ICD-10-CM | POA: Diagnosis present

## 2017-08-27 NOTE — Progress Notes (Signed)
[facility] for Repetitive Transcranial Magnetic Stimulation treatment for severe episode of recurrent major depressive disorder, without psychotic features. Patient presented with appropriate affect, level mood and denied any suicidal or homicidal ideations. Patient denies any other current symptoms and remains optimistic with continued TMS treatment. Patient reported no change in alcohol/substane use, caffeine consumption, sleep pattern or metal implant status since previous treatment. Ptpresented withpleasantaffect to txsession.She shared pictures of her family - they all went out to dinner yesterday evening. She watched TV and periodically talked to Clinical research associatewriter.Power levelremained at 120% for the duration of tx sesion.Patient reported nocomplaints anddiscomfort during tx.Patientdeparted post-treatment with no majorreported concerns.

## 2017-08-28 ENCOUNTER — Encounter (HOSPITAL_COMMUNITY): Payer: Self-pay

## 2017-08-31 ENCOUNTER — Other Ambulatory Visit (INDEPENDENT_AMBULATORY_CARE_PROVIDER_SITE_OTHER): Payer: Medicare Other | Admitting: Emergency Medicine

## 2017-08-31 DIAGNOSIS — F332 Major depressive disorder, recurrent severe without psychotic features: Secondary | ICD-10-CM

## 2017-08-31 NOTE — Progress Notes (Signed)
Patient reported to [facility] for Repetitive Transcranial Magnetic Stimulation treatment for severe episode of recurrent major depressive disorder, without psychotic features. Patient presented with appropriate affect, level mood and denied any suicidal or homicidal ideations. Patient denies any other current symptoms and remains optimistic with continued TMS treatment. Patient reported no change in alcohol/substane use, caffeine consumption, sleep pattern or metal implant status since previous treatment. Ptpresented withpleasantaffect to txsession.She watched TV and periodically talked to writer.Power levelremained at 120% for the duration of tx sesion.Patient reported nocomplaints anddiscomfort during tx.Patientdeparted post-treatment with no majorreported concerns. 

## 2017-09-01 ENCOUNTER — Other Ambulatory Visit (INDEPENDENT_AMBULATORY_CARE_PROVIDER_SITE_OTHER): Payer: Medicare Other | Admitting: Emergency Medicine

## 2017-09-01 DIAGNOSIS — F332 Major depressive disorder, recurrent severe without psychotic features: Secondary | ICD-10-CM

## 2017-09-01 NOTE — Progress Notes (Signed)
Patient reported to [facility] for Repetitive Transcranial Magnetic Stimulation treatment for severe episode of recurrent major depressive disorder, without psychotic features. Patient presented with appropriate affect, level mood and denied any suicidal or homicidal ideations. Patient denies any other current symptoms and remains optimistic with continued TMS treatment. Patient reported no change in alcohol/substane use, caffeine consumption, sleep pattern or metal implant status since previous treatment. Ptpresented withpleasantaffect to txsession.She watched TV and periodically talked to writer.Power levelremained at 120% for the duration of tx sesion.Patient reported nocomplaints anddiscomfort during tx.Patientdeparted post-treatment with no majorreported concerns. 

## 2017-09-02 ENCOUNTER — Other Ambulatory Visit (HOSPITAL_COMMUNITY): Payer: Medicare Other | Admitting: Emergency Medicine

## 2017-09-02 DIAGNOSIS — F332 Major depressive disorder, recurrent severe without psychotic features: Secondary | ICD-10-CM

## 2017-09-02 NOTE — Progress Notes (Signed)
[facility] Repetitive Transcranial Magnetic Stimulation treatment for severe episode of recurrent major depressive disorder, without psychotic features. Patient presented with appropriate affect, level mood and denied any suicidal or homicidal ideations. Patient denies any other current symptoms and remains optimistic with continued TMS treatment. Patient reported no change in alcohol/substane use, caffeine consumption, sleep pattern or metal implant status since previous treatment. Ptpresented withpleasantaffect to txsession.She watched TV and periodically talked to Clinical research associatewriter. She reported that she does not see any changes in her mood thus far - she started to get tearful. Writer provided comfort and tissues.Power levelremained at 120% for the duration of tx sesion.Patient reported nocomplaints anddiscomfort during tx.Patientdeparted post-treatment with no majorreported concerns.

## 2017-09-03 ENCOUNTER — Other Ambulatory Visit (INDEPENDENT_AMBULATORY_CARE_PROVIDER_SITE_OTHER): Payer: Medicare Other | Admitting: Emergency Medicine

## 2017-09-03 DIAGNOSIS — F332 Major depressive disorder, recurrent severe without psychotic features: Secondary | ICD-10-CM

## 2017-09-03 NOTE — Progress Notes (Signed)
Patient reported to [facility] for Repetitive Transcranial Magnetic Stimulation treatment for severe episode of recurrent major depressive disorder, without psychotic features. Patient presented with appropriate affect, level mood and denied any suicidal or homicidal ideations. Patient denies any other current symptoms and remains optimistic with continued TMS treatment. Patient reported no change in alcohol/substane use, caffeine consumption, sleep pattern or metal implant status since previous treatment. Ptpresented withpleasantaffect to txsession.She watched TV and periodically talked to writer.Power levelremained at 120% for the duration of tx sesion.Patient reported nocomplaints anddiscomfort during tx.Patientdeparted post-treatment with no majorreported concerns. 

## 2017-09-04 ENCOUNTER — Other Ambulatory Visit (INDEPENDENT_AMBULATORY_CARE_PROVIDER_SITE_OTHER): Payer: Medicare Other | Admitting: Emergency Medicine

## 2017-09-04 ENCOUNTER — Encounter (HOSPITAL_COMMUNITY): Payer: Self-pay

## 2017-09-04 DIAGNOSIS — F332 Major depressive disorder, recurrent severe without psychotic features: Secondary | ICD-10-CM

## 2017-09-04 NOTE — Progress Notes (Signed)
Patient reported to [facility] for Repetitive Transcranial Magnetic Stimulation treatment for severe episode of recurrent major depressive disorder, without psychotic features. Patient presented with appropriate affect, level mood and denied any suicidal or homicidal ideations. Patient denies any other current symptoms and remains optimistic with continued TMS treatment. Patient reported no change in alcohol/substane use, caffeine consumption, sleep pattern or metal implant status since previous treatment. Ptpresented withpleasantaffect to txsession.She watched TV and periodically talked to writer.Power levelremained at 120% for the duration of tx sesion.Patient reported nocomplaints anddiscomfort during tx.Patientdeparted post-treatment with no majorreported concerns. 

## 2017-09-07 ENCOUNTER — Other Ambulatory Visit (INDEPENDENT_AMBULATORY_CARE_PROVIDER_SITE_OTHER): Payer: Medicare Other | Admitting: Emergency Medicine

## 2017-09-07 DIAGNOSIS — F332 Major depressive disorder, recurrent severe without psychotic features: Secondary | ICD-10-CM

## 2017-09-07 NOTE — Progress Notes (Signed)
[facility] for Repetitive Transcranial Magnetic Stimulation treatment for severe episode of recurrent major depressive disorder, without psychotic features. Patient presented with appropriate affect, level mood and denied any suicidal or homicidal ideations. Patient denies any other current symptoms and remains optimistic with continued TMS treatment. Patient reported no change in alcohol/substane use, caffeine consumption, sleep pattern or metal implant status since previous treatment. Ptpresented withpleasantaffect to txsession.She watched TV and periodically talked to Clinical research associatewriter.She shared that she has been having abnormal dreams the past couple of days. Power levelremained at 120% for the duration of tx sesion.Patient reported nocomplaints anddiscomfort during tx.Patientdeparted post-treatment with no majorreported concerns.

## 2017-09-08 ENCOUNTER — Encounter (HOSPITAL_COMMUNITY): Payer: Self-pay

## 2017-09-09 ENCOUNTER — Other Ambulatory Visit (INDEPENDENT_AMBULATORY_CARE_PROVIDER_SITE_OTHER): Payer: Medicare Other | Admitting: Emergency Medicine

## 2017-09-09 DIAGNOSIS — F332 Major depressive disorder, recurrent severe without psychotic features: Secondary | ICD-10-CM | POA: Diagnosis not present

## 2017-09-09 NOTE — Progress Notes (Signed)
[facility] for Repetitive Transcranial Magnetic Stimulation treatment for severe episode of recurrent major depressive disorder, without psychotic features. Patient presented with appropriate affect, level mood and denied any suicidal or homicidal ideations. Patient denies any other current symptoms and remains optimistic with continued TMS treatment. Patient reported no change in alcohol/substane use, caffeine consumption, sleep pattern or metal implant status since previous treatment. Ptpresented withpleasantaffect to txsession. She shared that she went tubing with her best friend yesterday evening. She has a bruise on her back from falling.She watched TV and periodically talked to Clinical research associatewriter.Power levelremained at 120% for the duration of tx sesion.Patient reported nocomplaints anddiscomfort during tx.Patientdeparted post-treatment with no majorreported concerns.

## 2017-09-10 ENCOUNTER — Other Ambulatory Visit (INDEPENDENT_AMBULATORY_CARE_PROVIDER_SITE_OTHER): Payer: Medicare Other | Admitting: Emergency Medicine

## 2017-09-10 DIAGNOSIS — F332 Major depressive disorder, recurrent severe without psychotic features: Secondary | ICD-10-CM

## 2017-09-10 NOTE — Progress Notes (Signed)
Patient reported to [facility] for Repetitive Transcranial Magnetic Stimulation treatment for severe episode of recurrent major depressive disorder, without psychotic features. Patient presented with appropriate affect, level mood and denied any suicidal or homicidal ideations. Patient denies any other current symptoms and remains optimistic with continued TMS treatment. Patient reported no change in alcohol/substane use, caffeine consumption, sleep pattern or metal implant status since previous treatment. Ptpresented withpleasantaffect to txsession.She watched TV and periodically talked to writer.Power levelremained at 120% for the duration of tx sesion.Patient reported nocomplaints anddiscomfort during tx.Patientdeparted post-treatment with no majorreported concerns. 

## 2017-09-11 ENCOUNTER — Other Ambulatory Visit (INDEPENDENT_AMBULATORY_CARE_PROVIDER_SITE_OTHER): Payer: Medicare Other | Admitting: Emergency Medicine

## 2017-09-11 DIAGNOSIS — F332 Major depressive disorder, recurrent severe without psychotic features: Secondary | ICD-10-CM

## 2017-09-11 NOTE — Progress Notes (Signed)
Patient reported to [facility] for Repetitive Transcranial Magnetic Stimulation treatment for severe episode of recurrent major depressive disorder, without psychotic features. Patient presented with appropriate affect, level mood and denied any suicidal or homicidal ideations. Patient denies any other current symptoms and remains optimistic with continued TMS treatment. Patient reported no change in alcohol/substane use, caffeine consumption, sleep pattern or metal implant status since previous treatment. Ptpresented withpleasantaffect to txsession.She read a magazine and periodically talked to writer.Power levelremained at 120% for the duration of tx sesion.Patient reported nocomplaints anddiscomfort during tx.Patientdeparted post-treatment with no majorreported concerns. 

## 2017-09-14 ENCOUNTER — Other Ambulatory Visit (INDEPENDENT_AMBULATORY_CARE_PROVIDER_SITE_OTHER): Payer: Medicare Other | Admitting: Emergency Medicine

## 2017-09-14 DIAGNOSIS — F332 Major depressive disorder, recurrent severe without psychotic features: Secondary | ICD-10-CM | POA: Diagnosis not present

## 2017-09-14 NOTE — Progress Notes (Signed)
[facility] for Repetitive Transcranial Magnetic Stimulation treatment for severe episode of recurrent major depressive disorder, without psychotic features. Patient presented with appropriate affect, level mood and denied any suicidal or homicidal ideations. Patient denies any other current symptoms and remains optimistic with continued TMS treatment. Patient reported no change in alcohol/substane use, caffeine consumption, sleep pattern or metal implant status since previous treatment. Ptpresented withpleasantaffect to txsession.She watched TV and periodically talked to Clinical research associatewriter. She shared that she wants the motivation to read her favorite books/magazines. Power levelremained at 120% for the duration of tx sesion.Patient reported nocomplaints anddiscomfort during tx.Patientdeparted post-treatment with no majorreported concerns.

## 2017-09-15 ENCOUNTER — Other Ambulatory Visit (INDEPENDENT_AMBULATORY_CARE_PROVIDER_SITE_OTHER): Payer: Medicare Other | Admitting: Emergency Medicine

## 2017-09-15 DIAGNOSIS — F332 Major depressive disorder, recurrent severe without psychotic features: Secondary | ICD-10-CM

## 2017-09-15 NOTE — Progress Notes (Signed)
Patient [facility] to [facility] for Repetitive Transcranial Magnetic Stimulation treatment for severe episode of recurrent major depressive disorder, without psychotic features. Patient presented with appropriate affect, level mood and denied any suicidal or homicidal ideations. Patient denies any other current symptoms and remains optimistic with continued TMS treatment. Patient reported no change in alcohol/substane use, caffeine consumption, sleep pattern or metal implant status since previous treatment. Ptpresented withpleasantaffect to txsession.She read a magazine and periodically talked to Clinical research associatewriter.Power levelremained at 120% for the duration of tx sesion.Patient reported nocomplaints anddiscomfort during tx.Patientdeparted post-treatment with no majorreported concerns.

## 2017-09-16 ENCOUNTER — Other Ambulatory Visit (INDEPENDENT_AMBULATORY_CARE_PROVIDER_SITE_OTHER): Payer: Medicare Other | Admitting: Emergency Medicine

## 2017-09-16 DIAGNOSIS — F332 Major depressive disorder, recurrent severe without psychotic features: Secondary | ICD-10-CM

## 2017-09-16 NOTE — Progress Notes (Signed)
Patient reported to [facility] for Repetitive Transcranial Magnetic Stimulation treatment for severe episode of recurrent major depressive disorder, without psychotic features. Patient presented with appropriate affect, level mood and denied any suicidal or homicidal ideations. Patient denies any other current symptoms and remains optimistic with continued TMS treatment. Patient reported no change in alcohol/substane use, caffeine consumption, sleep pattern or metal implant status since previous treatment. Ptpresented withpleasantaffect to txsession.She watched TV and periodically talked to Clinical research associatewriter. She reported that she plans on taking walks with her husband and attending water aerobics. Power levelremained at 120% for the duration of tx sesion.Patient reported nocomplaints anddiscomfort during tx.Patientdeparted post-treatment with no majorreported concerns.

## 2017-09-17 ENCOUNTER — Ambulatory Visit (HOSPITAL_COMMUNITY): Payer: Self-pay | Admitting: Psychiatry

## 2017-09-17 ENCOUNTER — Other Ambulatory Visit (INDEPENDENT_AMBULATORY_CARE_PROVIDER_SITE_OTHER): Payer: Medicare Other | Admitting: Emergency Medicine

## 2017-09-17 DIAGNOSIS — F332 Major depressive disorder, recurrent severe without psychotic features: Secondary | ICD-10-CM

## 2017-09-17 NOTE — Progress Notes (Signed)
Patient reported to  [facility] for Repetitive Transcranial Magnetic Stimulation treatment for severe episode of recurrent major depressive disorder, without psychotic features. Patient presented with appropriate affect, level mood and denied any suicidal or homicidal ideations. Patient denies any other current symptoms and remains optimistic with continued TMS treatment. Patient reported no change in alcohol/substane use, caffeine consumption, sleep pattern or metal implant status since previous treatment. Ptpresented withpleasantaffect to txsession.She watched TV and periodically talked to writer.Power levelremained at 120% for the duration of tx sesion.Patient reported nocomplaints anddiscomfort during tx.Patientdeparted post-treatment with no majorreported concerns. 

## 2017-09-21 ENCOUNTER — Other Ambulatory Visit (INDEPENDENT_AMBULATORY_CARE_PROVIDER_SITE_OTHER): Payer: Medicare Other | Admitting: Emergency Medicine

## 2017-09-21 DIAGNOSIS — F332 Major depressive disorder, recurrent severe without psychotic features: Secondary | ICD-10-CM

## 2017-09-21 NOTE — Progress Notes (Signed)
Patient [facility] to [facility] for Repetitive Transcranial Magnetic Stimulation treatment for severe episode of recurrent major depressive disorder, without psychotic features. Patient presented with appropriate affect, level mood and denied any suicidal or homicidal ideations. Patient denies any other current symptoms and remains optimistic with continued TMS treatment. Patient reported no change in alcohol/substane use, caffeine consumption, sleep pattern or metal implant status since previous treatment. Ptpresented withpleasantaffect to txsession.She watched TV and periodically talked to Clinical research associatewriter.She shared that she enjoyed her weekend with her grandson.Power levelremained at 120% for the duration of tx sesion.Patient reported nocomplaints anddiscomfort during tx.Patientdeparted post-treatment with no majorreported concerns.

## 2017-09-22 ENCOUNTER — Other Ambulatory Visit (INDEPENDENT_AMBULATORY_CARE_PROVIDER_SITE_OTHER): Payer: Medicare Other | Admitting: Emergency Medicine

## 2017-09-22 DIAGNOSIS — F332 Major depressive disorder, recurrent severe without psychotic features: Secondary | ICD-10-CM

## 2017-09-22 NOTE — Progress Notes (Signed)
[facility] for Repetitive Transcranial Magnetic Stimulation treatment for severe episode of recurrent major depressive disorder, without psychotic features. Patient presented with appropriate affect, level mood and denied any suicidal or homicidal ideations. Patient denies any other current symptoms and remains optimistic with continued TMS treatment. Patient reported no change in alcohol/substane use, caffeine consumption, sleep pattern or metal implant status since previous treatment. Ptpresented withpleasantaffect to txsession.She watched TV and periodically talked to Clinical research associatewriter. She shared that she and her husband have been noticing slight positive changes in her mood. She has been expressing more interest in completing daily tasks. She is happy with the progress and feels motivated.Power levelremained at 120% for the duration of tx sesion.Patient reported nocomplaints anddiscomfort during tx.Patientdeparted post-treatment with no majorreported concerns.

## 2017-09-28 ENCOUNTER — Encounter (HOSPITAL_COMMUNITY): Payer: Self-pay

## 2017-09-29 ENCOUNTER — Other Ambulatory Visit (HOSPITAL_COMMUNITY): Payer: Medicare Other | Attending: Psychiatry

## 2017-09-29 DIAGNOSIS — F332 Major depressive disorder, recurrent severe without psychotic features: Secondary | ICD-10-CM | POA: Insufficient documentation

## 2017-09-29 NOTE — Progress Notes (Signed)
Patient reported to [facility] for Repetitive Transcranial Magnetic Stimulation treatment for severe episode of recurrent major depressive disorder, without psychotic features. Patient presented with appropriate affect, level mood and denied any suicidal or homicidal ideations. Patient denies any other current symptoms and remains optimistic with continued TMS tx. Patient reported no changed in alcohol/substance use, caffeine consumption, sleep pattern or metal implant status since previous tx. Patient arrived for tx by herself. Patient reported no complaints or discomfort during the duration of tx. Patient watched television. Patient departed post-treatment with no reported concerns. 

## 2017-11-05 ENCOUNTER — Encounter: Payer: Self-pay | Admitting: Emergency Medicine

## 2017-11-17 ENCOUNTER — Ambulatory Visit (INDEPENDENT_AMBULATORY_CARE_PROVIDER_SITE_OTHER): Payer: Medicare Other | Admitting: Psychiatry

## 2017-11-17 DIAGNOSIS — F332 Major depressive disorder, recurrent severe without psychotic features: Secondary | ICD-10-CM

## 2017-11-17 MED ORDER — DULOXETINE HCL 60 MG PO CPEP: 120.0000 mg | ORAL_CAPSULE | Freq: Every day | ORAL | 0 refills | Status: DC

## 2017-11-17 NOTE — Progress Notes (Signed)
[facility]  Patient ID: Rachael Callahan,  MRN: [id number]  PCP: [facility] (Inactive)  Date of Evaluation: 11/17/2017 Time spent:30 minutes   HISTORY/CURRENT STATUS: HPI  CC depression  Finished TMS 9/3.  No further improvement since then and may be regressing some. Pt reports that mood is Depressed and blah and describes anxiety as Minimal. Anxiety symptoms include: Excessive Worry,. Pt reports sleeps excessively total 12 hours never changed with TMS. Pt reports that appetite is good. Pt reports that energy is poor and anhedonia. Concentration is good. Suicidal thoughts:  denied by patient.  Little interest and enjoyment.  Reads very little. Called Dr. Rene Kocher re booster TMS and he said not for 6 months.  Psychiatric medications tried include fluoxetine bupropion, lithium, Trintellix, venlafaxine, pramipexole, Latuda 40 mg, Rexulti 2 mg with no benefit.  Individual Medical History/ Review of Systems: Changes? :No  Allergies: Patient has no known allergies.  Current Medications:  Current Outpatient Medications:  .  ALPRAZolam (XANAX) 0.25 MG tablet, Take 0.25 mg by mouth as needed for anxiety (take 1-2 tabs as needed for anxiety). , Disp: , Rfl:  .  Cholecalciferol (D 1000) 1000 units capsule, Take by mouth., Disp: , Rfl:  .  diclofenac sodium (VOLTAREN) 1 % GEL, Apply 4 g topically 4 (four) times daily., Disp: 100 g, Rfl: 0 .  DULoxetine (CYMBALTA) 30 MG capsule, Take 90 mg by mouth daily., Disp: , Rfl:  .  LATUDA 40 MG TABS tablet, TAKE 1 TABLET EVERY EVENING WITH FOOD, Disp: , Rfl: 0 .  metoprolol tartrate (LOPRESSOR) 25 MG tablet, Take 25 mg by mouth 2 (two) times daily., Disp: , Rfl:  .  Multiple Vitamins-Minerals (MULTIVITAMIN ADULT PO), Take by mouth., Disp: , Rfl:  .  pramipexole (MIRAPEX) 0.5 MG tablet, Take 0.5 mg by mouth every evening., Disp: , Rfl:  .  Probiotic Product (PROBIOTIC-10 PO), Take by mouth., Disp: , Rfl:  Medication  Side Effects: None  Family Medical/ Social History: Changes? No  MENTAL HEALTH EXAM:  There were no vitals taken for this visit.There is no height or weight on file to calculate BMI.  General Appearance: Casual  Eye Contact:  Good  Speech:  Normal Rate  Volume:  Normal  Mood:  Depressed  Affect:  Restricted  Thought Process:  Goal Directed  Orientation:  Full (Time, Place, and Person)  Thought Content: Logical   Suicidal Thoughts:  No  Homicidal Thoughts:  No  Memory:  Recent  Judgement:  Fair  Insight:  Fair  Psychomotor Activity:  Decreased  Concentration:  Concentration: Fair  Recall:  Good  Fund of Knowledge: Good  Language: Good  Akathisia:  No  AIMS (if indicated): not done  Assets:  Communication Skills Desire for Improvement Financial Resources/Insurance Leisure Time Transportation  ADL's:  Intact  Cognition: WNL  Prognosis:  Fair    DIAGNOSES:    ICD-10-CM   1. Severe episode of recurrent major depressive disorder, without psychotic features (HCC) F33.2    TRD S/P TMS  RECOMMENDATIONS:  Greater than 50% of face to face time with patient was spent on counseling and coordination of care. We discussed anhedonic TRD. Will use high dose cymbalta 120 mg daily.  Discussed side effect risks including serotonin syndrome. If that fails will augment with stimualant vs changing to a stimulating antidepressant. Will Rx Concerta to augment if needed.  Discussed discussed side effects of stimulants especially when added to antidepressants.  This is appropriate given her treatment  resistant status.  Call in 4 weeks if there is not significant improvement. And we'll start stimulant. NR to Latuda therefore Cut Latuda in half to 20 mg for 2 weeks then stop it..  Follow-up 6 weeks.  Call if side effects or symptoms worsen. Lauraine Rinne, MD

## 2017-11-17 NOTE — Patient Instructions (Addendum)
Cut Latuda in half to 20 mg for 2 weeks then stop it. Increase Duloxetine to 120mg  daily.  Call in 4 weeks if there is not significant improvement.

## 2017-12-02 ENCOUNTER — Other Ambulatory Visit: Payer: Self-pay

## 2017-12-02 MED ORDER — ALPRAZOLAM 0.25 MG PO TABS: ORAL_TABLET | ORAL | 1 refills | Status: DC

## 2017-12-03 ENCOUNTER — Other Ambulatory Visit: Payer: Self-pay | Admitting: Psychiatry

## 2017-12-03 DIAGNOSIS — F339 Major depressive disorder, recurrent, unspecified: Secondary | ICD-10-CM

## 2017-12-03 MED ORDER — METHYLPHENIDATE HCL ER (OSM) 36 MG PO TBCR: 36.0000 mg | EXTENDED_RELEASE_TABLET | Freq: Every day | ORAL | 0 refills | Status: DC

## 2017-12-03 NOTE — Progress Notes (Signed)
Pt called wanting to go ahead and add stimulant to help with mood.  Will add Concerta fior TRD at 36 mg daily.  We've discussed SE.

## 2017-12-29 ENCOUNTER — Other Ambulatory Visit: Payer: Self-pay | Admitting: Psychiatry

## 2017-12-29 ENCOUNTER — Telehealth: Payer: Self-pay | Admitting: Psychiatry

## 2017-12-29 DIAGNOSIS — F339 Major depressive disorder, recurrent, unspecified: Secondary | ICD-10-CM

## 2017-12-29 MED ORDER — METHYLPHENIDATE HCL ER (OSM) 36 MG PO TBCR: 36.0000 mg | EXTENDED_RELEASE_TABLET | Freq: Every day | ORAL | 0 refills | Status: DC

## 2017-12-29 NOTE — Telephone Encounter (Signed)
Concerta Refill needed- Asks for a 90 day refill.  [facility] [place] [place]

## 2017-12-29 NOTE — Progress Notes (Signed)
[facility] refill

## 2018-01-05 ENCOUNTER — Encounter: Payer: Self-pay | Admitting: Psychiatry

## 2018-01-05 ENCOUNTER — Other Ambulatory Visit: Payer: Self-pay

## 2018-01-05 ENCOUNTER — Ambulatory Visit (INDEPENDENT_AMBULATORY_CARE_PROVIDER_SITE_OTHER): Payer: Medicare Other | Admitting: Psychiatry

## 2018-01-05 VITALS — BP 165/97 | HR 74

## 2018-01-05 DIAGNOSIS — F339 Major depressive disorder, recurrent, unspecified: Secondary | ICD-10-CM | POA: Diagnosis not present

## 2018-01-05 MED ORDER — METHYLPHENIDATE HCL ER 54 MG PO TB24: 54.0000 mg | ORAL_TABLET | Freq: Every day | ORAL | 0 refills | Status: DC

## 2018-01-05 NOTE — Progress Notes (Signed)
Rachael Callahan [id number] Mar 23, 1945 72 y.o.  Subjective:   Patient ID:  Rachael Callahan is a 72 y.o. (DOB 1945-02-18) female.  Chief Complaint:  Chief Complaint  Patient presents with  . Follow-up    Medication Follow up    HPI Cj Beecher presents to the office today for follow-up of multiple med changes.  We increased the duloxetine to 120 6 weeks ago, weaned off Latuda without problems, and added Concerta for TRD.   Saw some benefit from Concerta.  Has seen some benefit in mood with these changes.  Thinks concerta made the most difference in motivation, energy.  Interest still lagging.  Is more active more willingly.  Went with friends out of town lately and enjoyed it.  Less anxiety .  Sleep good and less napping.  Wants to increase the Concerta.  Still forgetful.  Psychiatric medications tried include fluoxetine bupropion, lithium, Trintellix, venlafaxine, pramipexole, Latuda 40 mg, Rexulti 2 mg with no benefit, TMS, duloxetine.  Review of Systems:  Review of Systems  Neurological: Negative for tremors and weakness.  Psychiatric/Behavioral: Positive for decreased concentration and dysphoric mood. Negative for agitation, behavioral problems, confusion, hallucinations, self-injury, sleep disturbance and suicidal ideas. The patient is not nervous/anxious and is not hyperactive.     Medications: I have reviewed the patient's current medications.  Current Outpatient Medications  Medication Sig Dispense Refill  . ALPRAZolam (XANAX) 0.25 MG tablet Take 1-2 tablets everyday as needed anxiety 30 tablet 1  . Cholecalciferol (D 1000) 1000 units capsule Take by mouth.    . DULoxetine (CYMBALTA) 60 MG capsule Take 2 capsules (120 mg total) by mouth daily. 180 capsule 0  . losartan (COZAAR) 100 MG tablet Take 100 mg by mouth daily.  1  . metoprolol succinate (TOPROL-XL) 50 MG 24 hr tablet Take 50 mg by mouth daily.    . Multiple Vitamins-Minerals (MULTIVITAMIN ADULT PO) Take by mouth.    [name]  omeprazole (PRILOSEC) 40 MG capsule Take 40 mg by mouth daily.  1  . pramipexole (MIRAPEX) 0.5 MG tablet Take 0.5 mg by mouth every evening.    . pravastatin (PRAVACHOL) 10 MG tablet Take 10 mg by mouth daily.  3  . diclofenac sodium (VOLTAREN) 1 % GEL Apply 4 g topically 4 (four) times daily. (Patient not taking: Reported on 01/05/2018) 100 g 0  . LATUDA 40 MG TABS tablet TAKE 1 TABLET EVERY EVENING WITH FOOD  0  . methylphenidate 54 MG PO TB24 Take 54 mg by mouth daily. 30 tablet 0  . metoprolol tartrate (LOPRESSOR) 25 MG tablet Take 25 mg by mouth 2 (two) times daily.    . Probiotic Product (PROBIOTIC-10 PO) Take by mouth.     No current facility-administered medications for this visit.     Medication Side Effects: None  Allergies: No Known Allergies  Past Medical History:  Diagnosis Date  . Depression   . High cholesterol   . Hypertension   . Migraine   . Stomach ulcer     History reviewed. No pertinent family history.  Social History   Socioeconomic History  . Marital status: Married    Spouse name: Not on file  . Number of children: Not on file  . Years of education: Not on file  . Highest education level: Not on file  Occupational History  . Not on file  Social Needs  . Financial resource strain: Not on file  . Food insecurity:    Worry: Not on file    Inability:  Not on file  . Transportation needs:    Medical: Not on file    Non-medical: Not on file  Tobacco Use  . Smoking status: Never Smoker  . Smokeless tobacco: Never Used  Substance and Sexual Activity  . Alcohol use: Yes    Comment: rarely  . Drug use: Never  . Sexual activity: Not on file  Lifestyle  . Physical activity:    Days per week: Not on file    Minutes per session: Not on file  . Stress: Not on file  Relationships  . Social connections:    Talks on phone: Not on file    Gets together: Not on file    Attends religious service: Not on file    Active member of club or organization:  Not on file    Attends meetings of clubs or organizations: Not on file    Relationship status: Not on file  . Intimate partner violence:    Fear of current or ex partner: Not on file    Emotionally abused: Not on file    Physically abused: Not on file    Forced sexual activity: Not on file  Other Topics Concern  . Not on file  Social History Narrative  . Not on file    Past Medical History, Surgical history, Social history, and Family history were reviewed and updated as appropriate.   Please see review of systems for further details on the patient's review from today.   Objective:   Physical Exam:  BP (!) 165/97   Pulse 74   Physical Exam  Constitutional: She is oriented to person, place, and time. She appears well-developed. No distress.  Musculoskeletal: She exhibits no deformity.  Neurological: She is alert and oriented to person, place, and time. She displays no tremor. Coordination and gait normal.  Psychiatric: Her speech is normal and behavior is normal. Judgment and thought content normal. Her mood appears not anxious. Her affect is not angry, not blunt, not labile and not inappropriate. Cognition and memory are normal. She exhibits a depressed mood. She expresses no homicidal and no suicidal ideation. She expresses no suicidal plans and no homicidal plans.  Insight intact. No auditory or visual hallucinations. No delusions.  She is attentive.    Lab Review:     Component Value Date/Time   NA 141 05/14/2017 0917   K 4.4 05/14/2017 0917   CL 104 05/14/2017 0917   CO2 28 05/14/2017 0917   GLUCOSE 112 (H) 05/14/2017 0917   BUN 16 05/14/2017 0917   CREATININE 0.69 05/14/2017 0917   CALCIUM 9.3 05/14/2017 0917   GFRNONAA >60 05/14/2017 0917   GFRAA >60 05/14/2017 0917       Component Value Date/Time   WBC 6.3 05/14/2017 0917   RBC 4.58 05/14/2017 0917   HGB 14.2 05/14/2017 0917   HCT 41.7 05/14/2017 0917   PLT 235 05/14/2017 0917   MCV 91.0 05/14/2017 0917    MCH 31.0 05/14/2017 0917   MCHC 34.1 05/14/2017 0917   RDW 13.1 05/14/2017 0917   LYMPHSABS 1.4 05/14/2017 0917   MONOABS 0.5 05/14/2017 0917   EOSABS 0.2 05/14/2017 0917   BASOSABS 0.0 05/14/2017 0917    No results found for: POCLITH, LITHIUM   No results found for: PHENYTOIN, PHENOBARB, VALPROATE, CBMZ   .res Assessment: Plan:    Recurrent major depression resistant to treatment (HCC)   Agree with increase in Concerta to 54mg  daily for TRD and focus and attention.  Especially re  risks with the combination with duloxetine. Discussed potential benefits, risks, and side effects of stimulants with patient to include increased heart rate, palpitations, insomnia, increased anxiety, increased irritability, or decreased appetite.  Instructed patient to contact office if experiencing any significant tolerability issues. Watch BP.  Recommended that she check it several times a week and give us that information.  Check at different times of day in order to determine if the Concerta is having an effect on it or perhaps the duloxetine she agreed  Supportive therapy dealing with daughter with depression too.  This appt was 30 mins.  FU 8 weeks.  Meredith Staggersarey Cottle, MD, DFAPA  Please see After Visit Summary for patient specific instructions.  No future appointments.  No orders of the defined types were placed in this encounter.     -------------------------------

## 2018-01-28 ENCOUNTER — Other Ambulatory Visit: Payer: Self-pay | Admitting: Psychiatry

## 2018-01-28 ENCOUNTER — Telehealth: Payer: Self-pay | Admitting: Psychiatry

## 2018-01-28 DIAGNOSIS — F332 Major depressive disorder, recurrent severe without psychotic features: Secondary | ICD-10-CM

## 2018-01-28 MED ORDER — METHYLPHENIDATE HCL ER (OSM) 54 MG PO TBCR: 54.0000 mg | EXTENDED_RELEASE_TABLET | Freq: Every day | ORAL | 0 refills | Status: DC

## 2018-01-28 MED ORDER — METHYLPHENIDATE HCL ER 54 MG PO TB24: 54.0000 mg | ORAL_TABLET | Freq: Every day | ORAL | 0 refills | Status: DC

## 2018-01-28 NOTE — Telephone Encounter (Signed)
PATIENT NEEDS SCRIPT FOR CON[facility]ERTA [facility] MG [facility] [address]

## 2018-01-28 NOTE — Progress Notes (Signed)
Pt asks for concerta 54 refill 2 given.

## 2018-02-09 ENCOUNTER — Other Ambulatory Visit: Payer: Self-pay | Admitting: Psychiatry

## 2018-02-09 DIAGNOSIS — F332 Major depressive disorder, recurrent severe without psychotic features: Secondary | ICD-10-CM

## 2018-02-14 ENCOUNTER — Other Ambulatory Visit: Payer: Self-pay | Admitting: Psychiatry

## 2018-03-08 ENCOUNTER — Encounter: Payer: Self-pay | Admitting: Psychiatry

## 2018-03-08 ENCOUNTER — Ambulatory Visit (INDEPENDENT_AMBULATORY_CARE_PROVIDER_SITE_OTHER): Payer: Medicare Other | Admitting: Psychiatry

## 2018-03-08 VITALS — BP 170/101 | HR 73

## 2018-03-08 DIAGNOSIS — F332 Major depressive disorder, recurrent severe without psychotic features: Secondary | ICD-10-CM | POA: Diagnosis not present

## 2018-03-08 DIAGNOSIS — I10 Essential (primary) hypertension: Secondary | ICD-10-CM

## 2018-03-08 DIAGNOSIS — F339 Major depressive disorder, recurrent, unspecified: Secondary | ICD-10-CM

## 2018-03-08 MED ORDER — METHYLPHENIDATE HCL ER 54 MG PO TB24: 54.0000 mg | ORAL_TABLET | Freq: Every day | ORAL | 0 refills | Status: DC

## 2018-03-08 NOTE — Progress Notes (Signed)
Rachael Callahan [id number] 1945/09/24 73 y.o.  Subjective:   Patient ID:  Rachael Callahan is a 73 y.o. (DOB 1945-10-21) female.  Chief Complaint:  Chief Complaint  Patient presents with  . Follow-up    Medication Management   Last seen 01/05/18 HPI Rachael Callahan presents to the office today for follow-up of multiple med changes.   Increased Concerta to 54 mg daily.  Has been doing well.  Overall the combination of meds is effective.  More productive and more interested and no longer depressed.  No longer napping.   We increased the duloxetine to 120 6 weeks ago, weaned off Latuda without problems, and added Concerta for TRD.     Went with friends out of town lately and enjoyed it.  Less anxiety .  Sleep good and less napping.     BP is high and been that way at home also.  Appt with PCP next week.  Concerned about GD who's not doing well with psych treatment.  Had seen [name] before but transferred.  Bipolar disorder dx.  Seen at [facility].  Also had hosp.  Psychiatric medications tried include fluoxetine bupropion, lithium, Trintellix, venlafaxine, pramipexole, Latuda 40 mg, Rexulti 2 mg with no benefit, TMS, duloxetine.  Review of Systems:  Review of Systems  Neurological: Negative for tremors and weakness.  Psychiatric/Behavioral: Negative for agitation, behavioral problems, confusion, decreased concentration, dysphoric mood, hallucinations, self-injury, sleep disturbance and suicidal ideas. The patient is not nervous/anxious and is not hyperactive.     Medications: I have reviewed the patient's current medications.  Current Outpatient Medications  Medication Sig Dispense Refill  . ALPRAZolam (XANAX) 0.25 MG tablet Take 1-2 tablets everyday as needed anxiety 30 tablet 1  . Cholecalciferol (D 1000) 1000 units capsule Take by mouth.    . DULoxetine (CYMBALTA) 60 MG capsule TAKE 2 CAPSULES BY MOUTH DAILY 180 capsule 0  . losartan (COZAAR) 100 MG tablet Take 100 mg by  mouth daily.  1  . methylphenidate 54 MG PO CR tablet Take 1 tablet (54 mg total) by mouth daily. 30 tablet 0  . Methylphenidate HCl ER 54 MG TB24 Take 54 mg by mouth daily. 30 tablet 0  . metoprolol succinate (TOPROL-XL) 50 MG 24 hr tablet Take 50 mg by mouth daily.    . Multiple Vitamins-Minerals (MULTIVITAMIN ADULT PO) Take by mouth.    . pramipexole (MIRAPEX) 1 MG tablet TAKE 1 TABLET BY MOUTH NIGHTLY 90 tablet 1  . pravastatin (PRAVACHOL) 10 MG tablet Take 10 mg by mouth daily.  3  . Probiotic Product (PROBIOTIC-10 PO) Take by mouth.    . diclofenac sodium (VOLTAREN) 1 % GEL Apply 4 g topically 4 (four) times daily. (Patient not taking: Reported on 01/05/2018) 100 g 0  . metoprolol tartrate (LOPRESSOR) 25 MG tablet Take 25 mg by mouth 2 (two) times daily.    [name] omeprazole (PRILOSEC) 40 MG capsule Take 40 mg by mouth daily.  1   No current facility-administered medications for this visit.     Medication Side Effects: None  Allergies: No Known Allergies  Past Medical History:  Diagnosis Date  . Depression   . High cholesterol   . Hypertension   . Migraine   . Stomach ulcer     History reviewed. No pertinent family history.  Social History   Socioeconomic History  . Marital status: Married    Spouse name: Not on file  . Number of children: Not on file  . Years of education:  Not on file  . Highest education level: Not on file  Occupational History  . Not on file  Social Needs  . Financial resource strain: Not on file  . Food insecurity:    Worry: Not on file    Inability: Not on file  . Transportation needs:    Medical: Not on file    Non-medical: Not on file  Tobacco Use  . Smoking status: Never Smoker  . Smokeless tobacco: Never Used  Substance and Sexual Activity  . Alcohol use: Yes    Comment: rarely  . Drug use: Never  . Sexual activity: Not on file  Lifestyle  . Physical activity:    Days per week: Not on file    Minutes per session: Not on file  .  Stress: Not on file  Relationships  . Social connections:    Talks on phone: Not on file    Gets together: Not on file    Attends religious service: Not on file    Active member of club or organization: Not on file    Attends meetings of clubs or organizations: Not on file    Relationship status: Not on file  . Intimate partner violence:    Fear of current or ex partner: Not on file    Emotionally abused: Not on file    Physically abused: Not on file    Forced sexual activity: Not on file  Other Topics Concern  . Not on file  Social History Narrative  . Not on file    Past Medical History, Surgical history, Social history, and Family history were reviewed and updated as appropriate.   Please see review of systems for further details on the patient's review from today.   Objective:   Physical Exam:  BP (!) 170/101   Pulse 73   Physical Exam Constitutional:      General: She is not in acute distress.    Appearance: She is well-developed.  Musculoskeletal:        General: No deformity.  Neurological:     Mental Status: She is alert and oriented to person, place, and time.     Motor: No tremor.     Coordination: Coordination normal.     Gait: Gait normal.  Psychiatric:        Attention and Perception: Perception normal. She is attentive.        Mood and Affect: Mood is not anxious or depressed. Affect is not labile, blunt, angry or inappropriate.        Speech: Speech normal.        Behavior: Behavior normal.        Thought Content: Thought content normal. Thought content does not include homicidal or suicidal ideation. Thought content does not include homicidal or suicidal plan.        Cognition and Memory: Cognition normal.        Judgment: Judgment normal.     Comments: Insight intact. No auditory or visual hallucinations. No delusions.      Lab Review:     Component Value Date/Time   NA 141 05/14/2017 0917   K 4.4 05/14/2017 0917   CL 104 05/14/2017 0917    CO2 28 05/14/2017 0917   GLUCOSE 112 (H) 05/14/2017 0917   BUN 16 05/14/2017 0917   CREATININE 0.69 05/14/2017 0917   CALCIUM 9.3 05/14/2017 0917   GFRNONAA >60 05/14/2017 0917   GFRAA >60 05/14/2017 0917       Component Value Date/Time  WBC 6.3 05/14/2017 0917   RBC 4.58 05/14/2017 0917   HGB 14.2 05/14/2017 0917   HCT 41.7 05/14/2017 0917   PLT 235 05/14/2017 0917   MCV 91.0 05/14/2017 0917   MCH 31.0 05/14/2017 0917   MCHC 34.1 05/14/2017 0917   RDW 13.1 05/14/2017 0917   LYMPHSABS 1.4 05/14/2017 0917   MONOABS 0.5 05/14/2017 0917   EOSABS 0.2 05/14/2017 0917   BASOSABS 0.0 05/14/2017 0917    No results found for: POCLITH, LITHIUM   No results found for: PHENYTOIN, PHENOBARB, VALPROATE, CBMZ   .res Assessment: Plan:    Recurrent major depression resistant to treatment ([facility])  Accelerated hypertension   Agree with increase in Concerta to 54mg  daily for TRD and focus and attention. It helped. Especially re risks with the combination with duloxetine.  Talk with PCP re BP next week.  Discussed potential benefits, risks, and side effects of stimulants with patient to include increased heart rate, palpitations, insomnia, increased anxiety, increased irritability, or decreased appetite.  Instructed patient to contact office if experiencing any significant tolerability issues. Watch BP.  Recommended that she check it several times a week and give [place] that information.  Check at different times of day in order to determine if the Concerta is having an effect on it or perhaps the duloxetine she agreed  No med changes and she agrees.  Supportive therapy dealing with daughter with depression too.  This appt was 30 mins.  FU 12 weeks.  Meredith Staggers, MD, DFAPA  Please see After Visit Summary for patient specific instructions.  No future appointments.  No orders of the defined types were placed in this encounter.     -------------------------------

## 2018-04-05 ENCOUNTER — Other Ambulatory Visit: Payer: Self-pay | Admitting: Psychiatry

## 2018-04-05 ENCOUNTER — Telehealth: Payer: Self-pay | Admitting: Psychiatry

## 2018-04-05 DIAGNOSIS — F332 Major depressive disorder, recurrent severe without psychotic features: Secondary | ICD-10-CM

## 2018-04-05 MED ORDER — METHYLPHENIDATE HCL ER 54 MG PO TB24: 54.0000 mg | ORAL_TABLET | Freq: Every day | ORAL | 0 refills | Status: DC

## 2018-04-05 NOTE — Progress Notes (Signed)
There were 2 prescriptions for Concerta in the chart one for 30 days one for 90 days.  I sent in the 90-day prescription though I am not certain her insurance will cover it that way.

## 2018-04-05 NOTE — Telephone Encounter (Signed)
[facility] RF [facility]  [facility] [place].

## 2018-05-03 ENCOUNTER — Telehealth: Payer: Self-pay | Admitting: Psychiatry

## 2018-05-03 ENCOUNTER — Other Ambulatory Visit: Payer: Self-pay | Admitting: Psychiatry

## 2018-05-03 DIAGNOSIS — F332 Major depressive disorder, recurrent severe without psychotic features: Secondary | ICD-10-CM

## 2018-05-03 NOTE — Telephone Encounter (Signed)
Patient need refill on duloxatine [facility] [address] [facility] [place]

## 2018-05-03 NOTE — Telephone Encounter (Signed)
Refill submitted to pharmacy this morning

## 2018-05-31 ENCOUNTER — Other Ambulatory Visit: Payer: Self-pay

## 2018-05-31 ENCOUNTER — Telehealth: Payer: Self-pay | Admitting: Psychiatry

## 2018-05-31 MED ORDER — ALPRAZOLAM 0.25 MG PO TABS: ORAL_TABLET | ORAL | 0 refills | Status: DC

## 2018-05-31 NOTE — Telephone Encounter (Signed)
Pt requested refill on Xanax .25mg  @ [facility] [phone].  appt 06/07/18

## 2018-05-31 NOTE — Telephone Encounter (Signed)
Refill called into pharmacy per request.

## 2018-06-07 ENCOUNTER — Telehealth: Payer: Self-pay | Admitting: Psychiatry

## 2018-06-07 ENCOUNTER — Other Ambulatory Visit: Payer: Self-pay | Admitting: Psychiatry

## 2018-06-07 ENCOUNTER — Ambulatory Visit: Payer: Medicare Other | Admitting: Psychiatry

## 2018-06-07 DIAGNOSIS — F332 Major depressive disorder, recurrent severe without psychotic features: Secondary | ICD-10-CM

## 2018-06-07 MED ORDER — METHYLPHENIDATE HCL ER (OSM) 54 MG PO TBCR: 54.0000 mg | EXTENDED_RELEASE_TABLET | Freq: Every day | ORAL | 0 refills | Status: DC

## 2018-06-07 NOTE — Telephone Encounter (Signed)
No she cannot split Concerta because it is a extended release tablet and the release will be disrupted if she tries to split the tablets.  I will send in a new prescription for the 54 mg tablet.  Please let her know

## 2018-06-07 NOTE — Telephone Encounter (Signed)
Patient need refill on Concerta ER 54 mg., [facility] [place], but patient has a bottle of the generic of the Concerta ER 36 mg., tab wants to know if she can take 1 1/2 of those instead of getting a refill of the 54 mg., now.  Please advise

## 2018-06-07 NOTE — Telephone Encounter (Signed)
Left vm

## 2018-06-08 NOTE — Telephone Encounter (Signed)
Spoke with pt. And she verbalized understanding. She will pick up the new Rx today.

## 2018-06-08 NOTE — Telephone Encounter (Signed)
Left another VM today to return my call.

## 2018-07-13 ENCOUNTER — Telehealth: Payer: Self-pay | Admitting: Psychiatry

## 2018-07-13 ENCOUNTER — Other Ambulatory Visit: Payer: Self-pay

## 2018-07-13 DIAGNOSIS — F332 Major depressive disorder, recurrent severe without psychotic features: Secondary | ICD-10-CM

## 2018-07-13 MED ORDER — METHYLPHENIDATE HCL ER (OSM) 54 MG PO TBCR: 54.0000 mg | EXTENDED_RELEASE_TABLET | Freq: Every day | ORAL | 0 refills | Status: DC

## 2018-07-13 NOTE — Telephone Encounter (Signed)
Pended for 1 month approval

## 2018-07-13 NOTE — Telephone Encounter (Signed)
Patient called and said that she needs a refill on her concerta 54 mg er [facility] [address]

## 2018-07-13 NOTE — Telephone Encounter (Signed)
Patient made an appointment for 8/18

## 2018-07-26 ENCOUNTER — Other Ambulatory Visit: Payer: Self-pay | Admitting: Psychiatry

## 2018-07-26 DIAGNOSIS — F332 Major depressive disorder, recurrent severe without psychotic features: Secondary | ICD-10-CM

## 2018-08-04 ENCOUNTER — Other Ambulatory Visit: Payer: Self-pay

## 2018-08-04 ENCOUNTER — Telehealth: Payer: Self-pay | Admitting: Psychiatry

## 2018-08-04 MED ORDER — ALPRAZOLAM 0.25 MG PO TABS: ORAL_TABLET | ORAL | 0 refills | Status: DC

## 2018-08-04 NOTE — Telephone Encounter (Signed)
Patient need refill on Alprazolam 25 mg.,[facility] [address] in [place]

## 2018-08-04 NOTE — Telephone Encounter (Signed)
Refill sent.

## 2018-08-08 ENCOUNTER — Other Ambulatory Visit: Payer: Self-pay | Admitting: Psychiatry

## 2018-08-17 ENCOUNTER — Telehealth: Payer: Self-pay | Admitting: Psychiatry

## 2018-08-17 ENCOUNTER — Other Ambulatory Visit: Payer: Self-pay

## 2018-08-17 DIAGNOSIS — F332 Major depressive disorder, recurrent severe without psychotic features: Secondary | ICD-10-CM

## 2018-08-17 MED ORDER — METHYLPHENIDATE HCL ER (OSM) 54 MG PO TBCR: 54.0000 mg | EXTENDED_RELEASE_TABLET | Freq: Every day | ORAL | 0 refills | Status: DC

## 2018-08-17 NOTE — Telephone Encounter (Signed)
Pt requesting a refill on her Concerta. Stated she left a msg on Sunday stated she is on her last one.

## 2018-08-17 NOTE — Telephone Encounter (Signed)
Make sure checking on pharmacies, will send to pharmacy on file.

## 2018-09-14 ENCOUNTER — Ambulatory Visit (INDEPENDENT_AMBULATORY_CARE_PROVIDER_SITE_OTHER): Payer: Medicare Other | Admitting: Psychiatry

## 2018-09-14 ENCOUNTER — Encounter: Payer: Self-pay | Admitting: Psychiatry

## 2018-09-14 ENCOUNTER — Other Ambulatory Visit: Payer: Self-pay

## 2018-09-14 ENCOUNTER — Encounter (INDEPENDENT_AMBULATORY_CARE_PROVIDER_SITE_OTHER): Payer: Self-pay

## 2018-09-14 VITALS — BP 151/94 | HR 84

## 2018-09-14 DIAGNOSIS — F339 Major depressive disorder, recurrent, unspecified: Secondary | ICD-10-CM | POA: Diagnosis not present

## 2018-09-14 DIAGNOSIS — I10 Essential (primary) hypertension: Secondary | ICD-10-CM

## 2018-09-14 DIAGNOSIS — F332 Major depressive disorder, recurrent severe without psychotic features: Secondary | ICD-10-CM | POA: Diagnosis not present

## 2018-09-14 MED ORDER — METHYLPHENIDATE HCL ER (OSM) 54 MG PO TBCR: 54.0000 mg | EXTENDED_RELEASE_TABLET | Freq: Every day | ORAL | 0 refills | Status: DC

## 2018-09-14 MED ORDER — DULOXETINE HCL 60 MG PO CPEP: 120.0000 mg | ORAL_CAPSULE | Freq: Every day | ORAL | 1 refills | Status: DC

## 2018-09-14 NOTE — Progress Notes (Signed)
Rachael CorningBeverly Callahan [id number] Jul 29, 1945 73 y.o.  Subjective:   Patient ID:  Rachael CorningBeverly Callahan is a 73 y.o. (DOB Jul 29, 1945) female.  Chief Complaint:  Chief Complaint  Patient presents with  . Follow-up    Medication Management  . Depression    Medication Management  . Medication Refill    Concerta   Last seen 01/05/18 HPI Rachael CorningBeverly Renzulli presents to the office today for follow-up of TRD.  Prior addition of Concerta had helped with depression and productivity and interest and alertness. Last visit February 2020.  Still good On Concerta to 54 mg daily and duloxetine 120 mg since Jan.  Has been doing well.  Overall the combination of meds is effective.  More productive and more interested and no longer depressed.  No longer napping.   Busy summer.  D in law brought 2 gkids from [place] and that was great.  GS 8 years older than adopted GD and they do well together.  Thinks TMS helped some also.  Less anxiety .  Sleep good and less napping.  Patient reports stable mood and denies depressed or irritable moods.  Patient denies any recent difficulty with anxiety.  Patient denies difficulty with sleep initiation or maintenance. No napping.   Denies appetite disturbance.  Patient reports that energy and motivation have been good.  Patient denies any difficulty with concentration.  Patient denies any suicidal ideation.  BP is high but better and was OK at PCP last month.  Concerned about GD who's not doing well with psych treatment.  Had seen Rosey Batheresa before but transferred.  Bipolar disorder dx.  [facility].  Also had hosp.  Psychiatric medications tried include fluoxetine bupropion, lithium, Trintellix, venlafaxine, pramipexole, Latuda 40 mg, Rexulti 2 mg with no benefit, TMS, duloxetine 120 + Concerta 54..  Review of Systems:  Review of Systems  Neurological: Negative for tremors and weakness.  Psychiatric/Behavioral: Negative for agitation, behavioral problems, confusion,  decreased concentration, dysphoric mood, hallucinations, self-injury, sleep disturbance and suicidal ideas. The patient is not nervous/anxious and is not hyperactive.     Medications: I have reviewed the patient's current medications.  Current Outpatient Medications  Medication Sig Dispense Refill  . ALPRAZolam (XANAX) 0.25 MG tablet Take 1-2 tablets everyday as needed anxiety 30 tablet 0  . Cholecalciferol (D 1000) 1000 units capsule Take by mouth.    . DULoxetine (CYMBALTA) 60 MG capsule TAKE 2 CAPSULES BY MOUTH EVERY DAY 180 capsule 0  . losartan (COZAAR) 100 MG tablet Take 100 mg by mouth daily.  1  . methylphenidate 54 MG PO CR tablet Take 1 tablet (54 mg total) by mouth daily. 30 tablet 0  . metoprolol succinate (TOPROL-XL) 50 MG 24 hr tablet Take 50 mg by mouth daily.    . Multiple Vitamins-Minerals (MULTIVITAMIN ADULT PO) Take by mouth.    [name]. omeprazole (PRILOSEC) 40 MG capsule Take 40 mg by mouth daily.  1  . pramipexole (MIRAPEX) 1 MG tablet TAKE 1 TABLET BY MOUTH EVERY DAY AT NIGHT 90 tablet 1  . pravastatin (PRAVACHOL) 10 MG tablet Take 10 mg by mouth daily.  3  . Probiotic Product (PROBIOTIC-10 PO) Take by mouth.     No current facility-administered medications for this visit.     Medication Side Effects: None  Allergies: No Known Allergies  Past Medical History:  Diagnosis Date  . Depression   . High cholesterol   . Hypertension   . Migraine   . Stomach ulcer  History reviewed. No pertinent family history.  Social History   Socioeconomic History  . Marital status: Married    Spouse name: Not on file  . Number of children: Not on file  . Years of education: Not on file  . Highest education level: Not on file  Occupational History  . Not on file  Social Needs  . Financial resource strain: Not on file  . Food insecurity    Worry: Not on file    Inability: Not on file  . Transportation needs    Medical: Not on file    Non-medical: Not on file   Tobacco Use  . Smoking status: Never Smoker  . Smokeless tobacco: Never Used  Substance and Sexual Activity  . Alcohol use: Yes    Comment: rarely  . Drug use: Never  . Sexual activity: Not on file  Lifestyle  . Physical activity    Days per week: Not on file    Minutes per session: Not on file  . Stress: Not on file  Relationships  . Social Musicianconnections    Talks on phone: Not on file    Gets together: Not on file    Attends religious service: Not on file    Active member of club or organization: Not on file    Attends meetings of clubs or organizations: Not on file    Relationship status: Not on file  . Intimate partner violence    Fear of current or ex partner: Not on file    Emotionally abused: Not on file    Physically abused: Not on file    Forced sexual activity: Not on file  Other Topics Concern  . Not on file  Social History Narrative  . Not on file    Past Medical History, Surgical history, Social history, and Family history were reviewed and updated as appropriate.   Please see review of systems for further details on the patient's review from today.   Objective:   Physical Exam:  BP (!) 151/94   Pulse 84   Physical Exam Constitutional:      General: She is not in acute distress.    Appearance: She is well-developed.  Musculoskeletal:        General: No deformity.  Neurological:     Mental Status: She is alert and oriented to person, place, and time.     Motor: No tremor.     Coordination: Coordination normal.     Gait: Gait normal.  Psychiatric:        Attention and Perception: Perception normal. She is attentive.        Mood and Affect: Mood is not anxious or depressed. Affect is not labile, blunt, angry or inappropriate.        Speech: Speech normal.        Behavior: Behavior normal.        Thought Content: Thought content normal. Thought content does not include homicidal or suicidal ideation. Thought content does not include homicidal or  suicidal plan.        Cognition and Memory: Cognition normal.        Judgment: Judgment normal.     Comments: Insight intact. No auditory or visual hallucinations. No delusions.      Lab Review:     Component Value Date/Time   NA 141 05/14/2017 0917   K 4.4 05/14/2017 0917   CL 104 05/14/2017 0917   CO2 28 05/14/2017 0917   GLUCOSE 112 (H) 05/14/2017 [id number]  BUN 16 05/14/2017 0917   CREATININE 0.69 05/14/2017 0917   CALCIUM 9.3 05/14/2017 0917   GFRNONAA >60 05/14/2017 0917   GFRAA >60 05/14/2017 0917       Component Value Date/Time   WBC 6.3 05/14/2017 0917   RBC 4.58 05/14/2017 0917   HGB 14.2 05/14/2017 0917   HCT 41.7 05/14/2017 0917   PLT 235 05/14/2017 0917   MCV 91.0 05/14/2017 0917   MCH 31.0 05/14/2017 0917   MCHC 34.1 05/14/2017 0917   RDW 13.1 05/14/2017 0917   LYMPHSABS 1.4 05/14/2017 0917   MONOABS 0.5 05/14/2017 0917   EOSABS 0.2 05/14/2017 0917   BASOSABS 0.0 05/14/2017 0917    No results found for: POCLITH, LITHIUM   No results found for: PHENYTOIN, PHENOBARB, VALPROATE, CBMZ   .res Assessment: Plan:    Jennalynn was seen today for follow-up, depression and medication refill.  Diagnoses and all orders for this visit:  Recurrent major depression resistant to treatment ([place])  Accelerated hypertension   Agree with increase in Concerta to 54mg  daily for TRD and focus and attention. It helped. Especially re risks with the combination with duloxetine.  Talk with PCP re BP next week.  Discussed potential benefits, risks, and side effects of stimulants with patient to include increased heart rate, palpitations, insomnia, increased anxiety, increased irritability, or decreased appetite.  Instructed patient to contact office if experiencing any significant tolerability issues. Watch BP. It is mildly elevated.  Still Recommended that she check it several times a week and give [place] that information.  Check at different times of day in order to determine if  the Concerta is having an effect on it or perhaps the duloxetine she agreed  No med changes and she agrees.  Supportive therapy dealing with daughter with depression too.  This appt was 30 mins.  FU 6 mos bc stable last 6 mos.  No evidence for SUD  Lynder Parents, MD, DFAPA  Please see After Visit Summary for patient specific instructions.  No future appointments.  No orders of the defined types were placed in this encounter.     -------------------------------

## 2018-09-16 ENCOUNTER — Telehealth: Payer: Self-pay

## 2018-09-16 NOTE — Telephone Encounter (Signed)
Prior authorization submitted for methylphenidate 54 mg #90 through [facility]. All clinical questions answered and faxed for review. May take 1-5 days for a response.

## 2018-09-20 ENCOUNTER — Other Ambulatory Visit: Payer: Self-pay

## 2018-09-20 DIAGNOSIS — F332 Major depressive disorder, recurrent severe without psychotic features: Secondary | ICD-10-CM

## 2018-10-05 ENCOUNTER — Telehealth: Payer: Self-pay | Admitting: Psychiatry

## 2018-10-05 ENCOUNTER — Other Ambulatory Visit: Payer: Self-pay

## 2018-10-05 MED ORDER — ALPRAZOLAM 0.25 MG PO TABS: ORAL_TABLET | ORAL | 2 refills | Status: DC

## 2018-10-05 NOTE — Telephone Encounter (Signed)
Patient called and said that she needs a refill on her generic xanax to be [facility] to the [facility] [address].

## 2018-10-05 NOTE — Telephone Encounter (Signed)
Refill sent per request.

## 2018-10-14 ENCOUNTER — Ambulatory Visit (HOSPITAL_COMMUNITY): Payer: Medicare Other | Admitting: Psychiatry

## 2018-10-15 ENCOUNTER — Telehealth: Payer: Self-pay | Admitting: Psychiatry

## 2018-10-15 NOTE — Telephone Encounter (Signed)
Pt called again to ask Dr.Cottle what he knows and how he feels about the [place] treatments

## 2018-10-15 NOTE — Telephone Encounter (Signed)
Patient left a message that she needs a refill on her concerta 54 mg [facility] the [facility] [address]

## 2018-10-15 NOTE — Telephone Encounter (Signed)
Last refill 09/18/2018 #30  Will pend for approval

## 2018-10-17 MED ORDER — METHYLPHENIDATE HCL ER (OSM) 54 MG PO TBCR: 54.0000 mg | EXTENDED_RELEASE_TABLET | Freq: Every day | ORAL | 0 refills | Status: DC

## 2018-10-17 MED ORDER — METHYLPHENIDATE HCL ER (OSM) 54 MG PO TBCR: 54.0000 mg | EXTENDED_RELEASE_TABLET | ORAL | 0 refills | Status: DC

## 2018-10-18 ENCOUNTER — Other Ambulatory Visit: Payer: Self-pay | Admitting: Psychiatry

## 2018-10-18 DIAGNOSIS — F332 Major depressive disorder, recurrent severe without psychotic features: Secondary | ICD-10-CM

## 2018-10-18 MED ORDER — METHYLPHENIDATE HCL ER (OSM) 54 MG PO TBCR: 54.0000 mg | EXTENDED_RELEASE_TABLET | ORAL | 0 refills | Status: DC

## 2018-10-18 MED ORDER — METHYLPHENIDATE HCL ER (OSM) 54 MG PO TBCR: 54.0000 mg | EXTENDED_RELEASE_TABLET | Freq: Every day | ORAL | 0 refills | Status: DC

## 2018-10-18 NOTE — Telephone Encounter (Signed)
TMS is a 6-week course of magnetic treatment for treatment resistant depression.  There are no medications involved.  It is about 70% effective at markedly improving or resolving depression that has not responded adequately to medications.  The treatments are Monday through 5 Friday lasting about 30 minutes.  The individual is able to drive himself to the appointment and back home.  The 2 local places that do it are [facility] in [place].  I recommended to patients regularly.  There are no significant side effects other than sometimes headache.  If she is interested in pursuing this make an earlier appointment with me and we will get the documentation together to help with the approval process.  She can contact Zacarias Pontes or Adrienne Mocha on her own if she wishes for a consultation.  I sent in the 2 refills for Concerta that looks like they were pended.

## 2018-11-02 ENCOUNTER — Telehealth: Payer: Self-pay | Admitting: Psychiatry

## 2018-11-02 NOTE — Telephone Encounter (Signed)
Patient would like to know if dr. Clovis Pu is going to see her firend ann curriant. Please let her know either way [phone]

## 2018-11-02 NOTE — Telephone Encounter (Signed)
ok 

## 2018-11-03 NOTE — Telephone Encounter (Signed)
Gisselle has been called and told that CC will see her friend.  We have all the info so will call to get her scheduled

## 2018-11-03 NOTE — Telephone Encounter (Signed)
The [employer] [place] device is not harmful.  There is very limited good status to show that it is substantially helpful.  It is clearly not the same as [place].  But if she wants to try it it will not hurt anything.

## 2018-11-10 NOTE — Telephone Encounter (Signed)
Pt's VM is full. I will try reaching out to her tomorrow.

## 2018-11-12 ENCOUNTER — Other Ambulatory Visit: Payer: Self-pay | Admitting: Psychiatry

## 2018-11-24 ENCOUNTER — Telehealth: Payer: Self-pay | Admitting: Psychiatry

## 2018-11-24 NOTE — Telephone Encounter (Signed)
Rachael Callahan called to request refill of her Alprazolam.  Said the pharmacy would not connect [place] to a refill.  Next appt 03/17/19.  [facility] in [facility]ill [facility]

## 2018-11-24 NOTE — Telephone Encounter (Signed)
Spoke with pharmacist and they have 2 refills on file, instructed to go ahead and fill for patient today

## 2018-12-02 NOTE — Telephone Encounter (Signed)
Pt. Made aware.

## 2019-01-15 ENCOUNTER — Other Ambulatory Visit: Payer: Self-pay | Admitting: Psychiatry

## 2019-01-15 DIAGNOSIS — F332 Major depressive disorder, recurrent severe without psychotic features: Secondary | ICD-10-CM

## 2019-02-01 ENCOUNTER — Telehealth: Payer: Self-pay | Admitting: Psychiatry

## 2019-02-01 NOTE — Telephone Encounter (Signed)
Patient called and left a message  stating that she needs a refill on her xanax. [facility]

## 2019-02-02 ENCOUNTER — Other Ambulatory Visit: Payer: Self-pay

## 2019-02-02 MED ORDER — ALPRAZOLAM 0.25 MG PO TABS: ORAL_TABLET | ORAL | 1 refills | Status: DC

## 2019-02-02 NOTE — Telephone Encounter (Signed)
Xanax refill called into her pharmacy, has f/u in Feb.

## 2019-02-16 ENCOUNTER — Other Ambulatory Visit: Payer: Self-pay

## 2019-02-16 ENCOUNTER — Telehealth: Payer: Self-pay | Admitting: Psychiatry

## 2019-02-16 DIAGNOSIS — F332 Major depressive disorder, recurrent severe without psychotic features: Secondary | ICD-10-CM

## 2019-02-16 MED ORDER — METHYLPHENIDATE HCL ER (OSM) 54 MG PO TBCR: 54.0000 mg | EXTENDED_RELEASE_TABLET | Freq: Every day | ORAL | 0 refills | Status: DC

## 2019-02-16 NOTE — Telephone Encounter (Signed)
Pt called requesting refill for Concerta 54 mg @ [facility]

## 2019-02-16 NOTE — Telephone Encounter (Signed)
Pended for approval by Dr. Jennelle Human

## 2019-03-14 ENCOUNTER — Telehealth: Payer: Self-pay | Admitting: Psychiatry

## 2019-03-14 ENCOUNTER — Other Ambulatory Visit: Payer: Self-pay

## 2019-03-14 DIAGNOSIS — F332 Major depressive disorder, recurrent severe without psychotic features: Secondary | ICD-10-CM

## 2019-03-14 MED ORDER — METHYLPHENIDATE HCL ER (OSM) 54 MG PO TBCR: 54.0000 mg | EXTENDED_RELEASE_TABLET | Freq: Every day | ORAL | 0 refills | Status: DC

## 2019-03-14 NOTE — Telephone Encounter (Signed)
Last refill 02/16/2019 pended for Dr. Jennelle Human to submit

## 2019-03-14 NOTE — Telephone Encounter (Signed)
Patient called and said that she needs a refill on her concerta 54 mg. She needs it sent to [facility] on [address]. She has an appointment on 2/18

## 2019-03-17 ENCOUNTER — Ambulatory Visit: Payer: Medicare Other | Admitting: Psychiatry

## 2019-04-08 ENCOUNTER — Other Ambulatory Visit: Payer: Self-pay

## 2019-04-08 ENCOUNTER — Encounter: Payer: Self-pay | Admitting: Psychiatry

## 2019-04-08 ENCOUNTER — Ambulatory Visit (INDEPENDENT_AMBULATORY_CARE_PROVIDER_SITE_OTHER): Payer: Medicare Other | Admitting: Psychiatry

## 2019-04-08 VITALS — BP 166/97 | HR 87

## 2019-04-08 DIAGNOSIS — F332 Major depressive disorder, recurrent severe without psychotic features: Secondary | ICD-10-CM

## 2019-04-08 DIAGNOSIS — F339 Major depressive disorder, recurrent, unspecified: Secondary | ICD-10-CM | POA: Diagnosis not present

## 2019-04-08 DIAGNOSIS — G2581 Restless legs syndrome: Secondary | ICD-10-CM

## 2019-04-08 DIAGNOSIS — F418 Other specified anxiety disorders: Secondary | ICD-10-CM | POA: Diagnosis not present

## 2019-04-08 MED ORDER — DULOXETINE HCL 60 MG PO CPEP: 120.0000 mg | ORAL_CAPSULE | Freq: Every day | ORAL | 1 refills | Status: DC

## 2019-04-08 MED ORDER — METHYLPHENIDATE HCL ER (OSM) 54 MG PO TBCR: 54.0000 mg | EXTENDED_RELEASE_TABLET | ORAL | 0 refills | Status: DC

## 2019-04-08 MED ORDER — METHYLPHENIDATE HCL ER (OSM) 54 MG PO TBCR: 54.0000 mg | EXTENDED_RELEASE_TABLET | Freq: Every day | ORAL | 0 refills | Status: DC

## 2019-04-08 NOTE — Progress Notes (Signed)
Rayola Everhart [id number] [birth year] 74 y.o.  Subjective:   Patient ID:  Nayellie Sanseverino is a 74 y.o. (DOB [birth year]) female.  Chief Complaint:  Chief Complaint  Patient presents with  . Follow-up    Medication Management  . Depression    Medication Management    HPI Euleta Belson presents to the office today for follow-up of TRD.  Prior addition of Concerta had helped with depression and productivity and interest and alertness.  Last visit August 2020.  She was doing generally well and no meds were changed.  BP up here but reports it has been normal at home.    No new problems.  Still good On Concerta to 54 mg daily and duloxetine 120 mg since Jan 2020.  Has been doing well.  Overall the combination of meds is effective.  More productive and more interested and no longer depressed.  No longer napping.   Busy summer.  D in law brought 2 gkids from [place] and that was great.  GS 8 years older than adopted GD and they do well together.  Thinks TMS helped some also.  RLS managed usually with daily pramipexole.  Xanax use a couple times per month. Often over concerns with grand-daughter's mother who causes problems.  Minimal anxiety .  Sleep good and less napping.  Patient reports stable mood and denies depressed or irritable moods.  Patient denies any recent difficulty with anxiety.  Patient denies difficulty with sleep initiation or maintenance. Night owl and gets 7-8 hours.  No napping.   Denies appetite disturbance.  Patient reports that energy and motivation have been good.  Patient denies any difficulty with concentration.  Patient denies any suicidal ideation.  BP is high but better and was OK at PCP last month.  Concerned about GD who's not doing well with psych treatment.  Had seen [name] before but transferred.  Bipolar disorder dx.  Se[facility] at [facility].  Also had hosp.  Psychiatric medications tried include fluoxetine bupropion, lithium, Trintellix, venlafaxine,  pramipexole, Latuda 40 mg, Rexulti 2 mg with no benefit, TMS, duloxetine 120 + Concerta 54..  Review of Systems:  Review of Systems  Neurological: Negative for dizziness, tremors and weakness.  Psychiatric/Behavioral: Negative for agitation, behavioral problems, confusion, decreased concentration, dysphoric mood, hallucinations, self-injury, sleep disturbance and suicidal ideas. The patient is not nervous/anxious and is not hyperactive.     Medications: I have reviewed the patient's current medications.  Current Outpatient Medications  Medication Sig Dispense Refill  . ALPRAZolam (XANAX) 0.25 MG tablet Take 1-2 tablets everyday as needed anxiety 30 tablet 1  . Cholecalciferol (D 1000) 1000 units capsule Take by mouth.    . DULoxetine (CYMBALTA) 60 MG capsule Take 2 capsules (120 mg total) by mouth daily. 180 capsule 1  . losartan (COZAAR) 100 MG tablet Take 100 mg by mouth daily.  1  . methylphenidate 54 MG PO CR tablet Take 1 tablet (54 mg total) by mouth every morning. 30 tablet 0  . [START ON 05/06/2019] methylphenidate 54 MG PO CR tablet Take 1 tablet (54 mg total) by mouth daily. 30 tablet 0  . metoprolol succinate (TOPROL-XL) 50 MG 24 hr tablet Take 50 mg by mouth daily.    . Multiple Vitamins-Minerals (MULTIVITAMIN ADULT PO) Take by mouth.    [name] omeprazole (PRILOSEC) 40 MG capsule Take 40 mg by mouth daily.  1  . pramipexole (MIRAPEX) 1 MG tablet TAKE 1 TABLET BY MOUTH EVERY DAY AT NIGHT 90 tablet 1  .  pravastatin (PRAVACHOL) 10 MG tablet Take 10 mg by mouth daily.  3  . Probiotic Product (PROBIOTIC-10 PO) Take by mouth.    Melene Muller ON 06/03/2019] methylphenidate 54 MG PO CR tablet Take 1 tablet (54 mg total) by mouth every morning. 30 tablet 0   No current facility-administered medications for this visit.    Medication Side Effects: None  Allergies: No Known Allergies  Past Medical History:  Diagnosis Date  . Depression   . High cholesterol   . Hypertension   . Migraine   .  Stomach ulcer     History reviewed. No pertinent family history.  Social History   Socioeconomic History  . Marital status: Married    Spouse name: Not on file  . Number of children: Not on file  . Years of education: Not on file  . Highest education level: Not on file  Occupational History  . Not on file  Tobacco Use  . Smoking status: Never Smoker  . Smokeless tobacco: Never Used  Substance and Sexual Activity  . Alcohol use: Yes    Comment: rarely  . Drug use: Never  . Sexual activity: Not on file  Other Topics Concern  . Not on file  Social History Narrative  . Not on file   Social Determinants of Health   Financial Resource Strain:   . Difficulty of Paying Living Expenses:   Food Insecurity:   . Worried About Programme researcher, broadcasting/film/video in the Last Year:   . Barista in the Last Year:   Transportation Needs:   . Freight forwarder (Medical):   [name] Lack of Transportation (Non-Medical):   Physical Activity:   . Days of Exercise per Week:   . Minutes of Exercise per Session:   Stress:   . Feeling of Stress :   Social Connections:   . Frequency of Communication with Friends and Family:   . Frequency of Social Gatherings with Friends and Family:   . Attends Religious Services:   . Active Member of Clubs or Organizations:   . Attends Banker Meetings:   [name] Marital Status:   Intimate Partner Violence:   . Fear of Current or Ex-Partner:   . Emotionally Abused:   [name] Physically Abused:   . Sexually Abused:     Past Medical History, Surgical history, Social history, and Family history were reviewed and updated as appropriate.   Please see review of systems for further details on the patient's review from today.   Objective:   Physical Exam:  BP (!) 169/102   Pulse 85   Physical Exam Constitutional:      General: She is not in acute distress.    Appearance: She is well-developed.  Musculoskeletal:        General: No deformity.   Neurological:     Mental Status: She is alert and oriented to person, place, and time.     Motor: No tremor.     Coordination: Coordination normal.     Gait: Gait normal.  Psychiatric:        Attention and Perception: Perception normal. She is attentive.        Mood and Affect: Mood is not anxious or depressed. Affect is not labile, blunt, angry, tearful or inappropriate.        Speech: Speech normal.        Behavior: Behavior normal.        Thought Content: Thought content normal. Thought content does  not include homicidal or suicidal ideation. Thought content does not include homicidal or suicidal plan.        Cognition and Memory: Cognition normal.        Judgment: Judgment normal.     Comments: Insight intact. No auditory or visual hallucinations. No delusions.      Lab Review:     Component Value Date/Time   NA 141 05/14/2017 0917   K 4.4 05/14/2017 0917   CL 104 05/14/2017 0917   CO2 28 05/14/2017 0917   GLUCOSE 112 (H) 05/14/2017 0917   BUN 16 05/14/2017 0917   CREATININE 0.69 05/14/2017 0917   CALCIUM 9.3 05/14/2017 0917   GFRNONAA >60 05/14/2017 0917   GFRAA >60 05/14/2017 0917       Component Value Date/Time   WBC 6.3 05/14/2017 0917   RBC 4.58 05/14/2017 0917   HGB 14.2 05/14/2017 0917   HCT 41.7 05/14/2017 0917   PLT 235 05/14/2017 0917   MCV 91.0 05/14/2017 0917   MCH 31.0 05/14/2017 0917   MCHC 34.1 05/14/2017 0917   RDW 13.1 05/14/2017 0917   LYMPHSABS 1.4 05/14/2017 0917   MONOABS 0.5 05/14/2017 0917   EOSABS 0.2 05/14/2017 0917   BASOSABS 0.0 05/14/2017 0917    No results found for: POCLITH, LITHIUM   No results found for: PHENYTOIN, PHENOBARB, VALPROATE, CBMZ   .res Assessment: Plan:    Kemaya was seen today for follow-up and depression.  Diagnoses and all orders for this visit:  Recurrent major depression resistant to treatment ([place]) -     methylphenidate 54 MG PO CR tablet; Take 1 tablet (54 mg total) by mouth every morning. -      methylphenidate 54 MG PO CR tablet; Take 1 tablet (54 mg total) by mouth daily. -     methylphenidate 54 MG PO CR tablet; Take 1 tablet (54 mg total) by mouth every morning.  Restless legs syndrome  Situational anxiety  Severe episode of recurrent major depressive disorder, without psychotic features ([facility]) -     DULoxetine (CYMBALTA) 60 MG capsule; Take 2 capsules (120 mg total) by mouth daily.   Agree with increase in Concerta to 54mg  daily for TRD and focus and attention. It helped. Especially re risks with the combination with duloxetine.  Talk with PCP re BP next week.  Discussed potential benefits, risks, and side effects of stimulants with patient to include increased heart rate, palpitations, insomnia, increased anxiety, increased irritability, or decreased appetite.  Instructed patient to contact office if experiencing any significant tolerability issues. Watch BP. It is mildly elevated.  Still Recommended that she check it several times a week and give [place] that information.  Check at different times of day in order to determine if the Concerta is having an effect on it or perhaps the duloxetine she agreed  No med changes and she agrees.  Supportive therapy dealing with daughter with depression too. And also friend Lelon Frohlich who's coming here now too.  This appt was 30 mins.  FU 6 mos bc stable last 6 mos.  No evidence for SUD  Lynder Parents, MD, DFAPA  Please see After Visit Summary for patient specific instructions.  No future appointments.  No orders of the defined types were placed in this encounter.     -------------------------------

## 2019-05-01 IMAGING — CT CT ANGIO CHEST-ABD-PELV FOR DISSECTION W/ AND WO/W CM
2 of 12 series · 14 of 46 positions shown, 16 images · IV contrast (APPLIED)
Comparison: None.

CLINICAL DATA: Back pain radiating into the right arm.

EXAM:
CT ANGIOGRAPHY CHEST, ABDOMEN AND PELVIS
TECHNIQUE: Multidetector CT imaging through the chest, abdomen and pelvis was
performed using the standard protocol during bolus administration of
intravenous contrast. Multiplanar reconstructed images and MIPs were
obtained and reviewed to evaluate the vascular anatomy.
CONTRAST:  100mL [id number] IOPAMIDOL ([id number]) INJECTION 76%

[Series 7: axial arterial · axial · arterial · 0.95mm/px · z∈[-608,-50]mm · 11 of 224 slices shown, 13 images]
[im 19/224  soft-tissue]
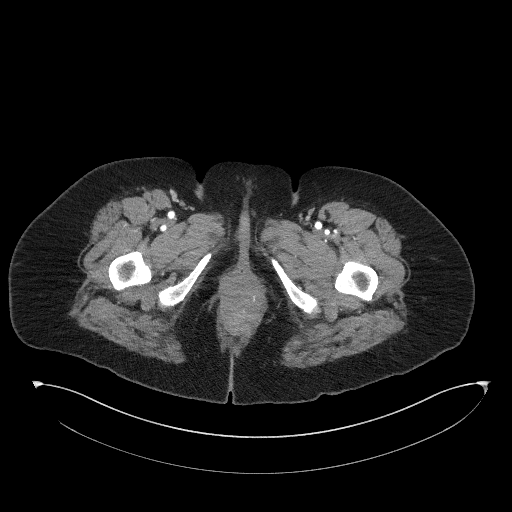
[im 19/224  bone]
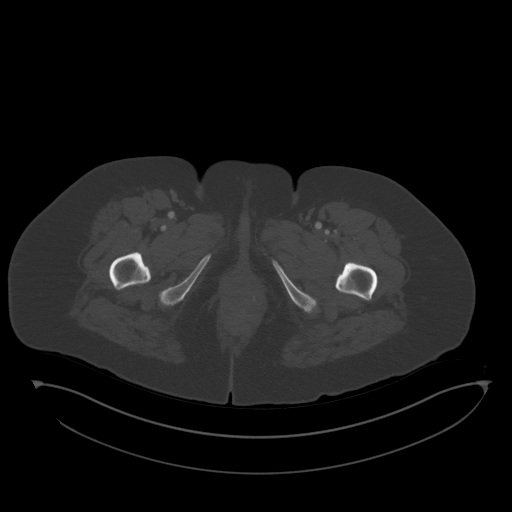
[im 38/224  soft-tissue]
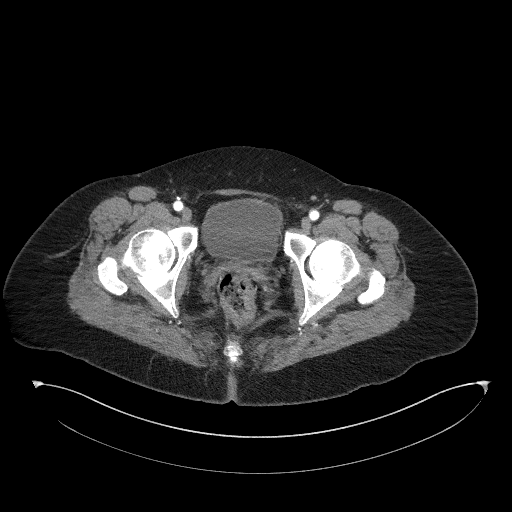
[im 56/224  soft-tissue]
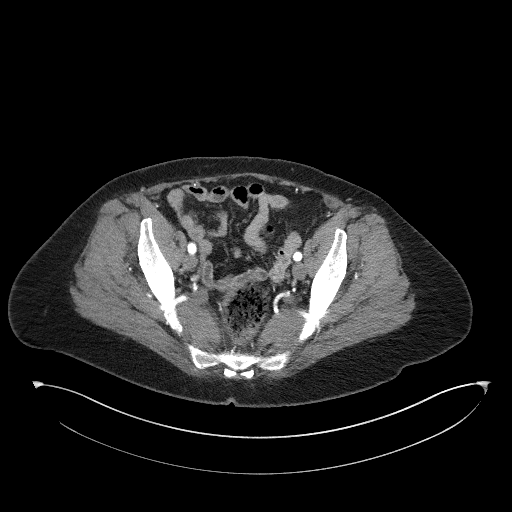
[im 75/224  soft-tissue]
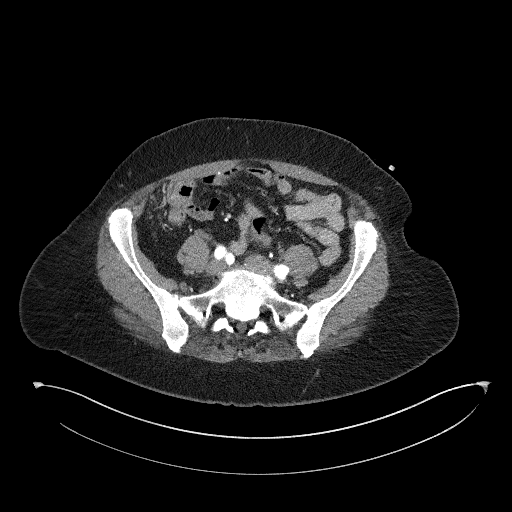
[im 93/224  soft-tissue]
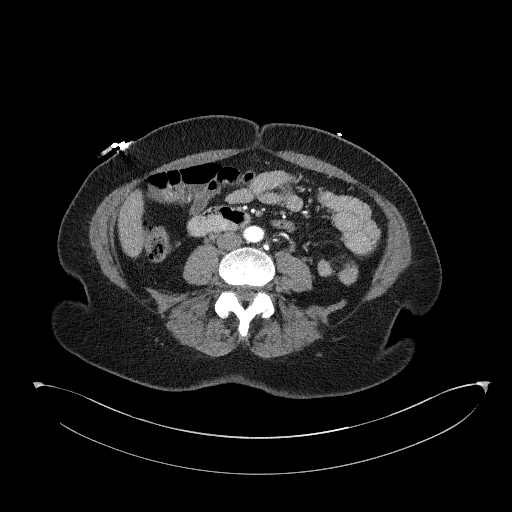
[im 112/224  soft-tissue]
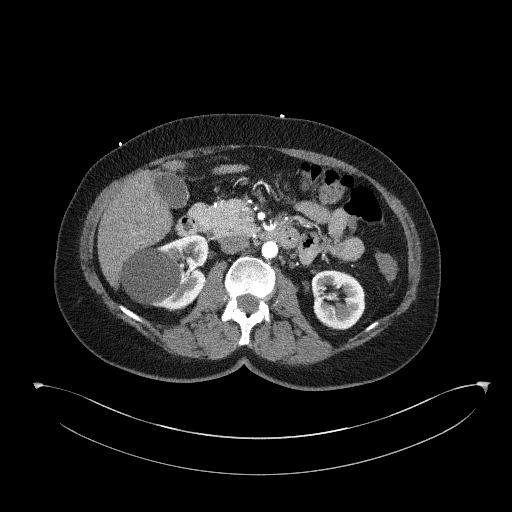
[im 131/224  soft-tissue]
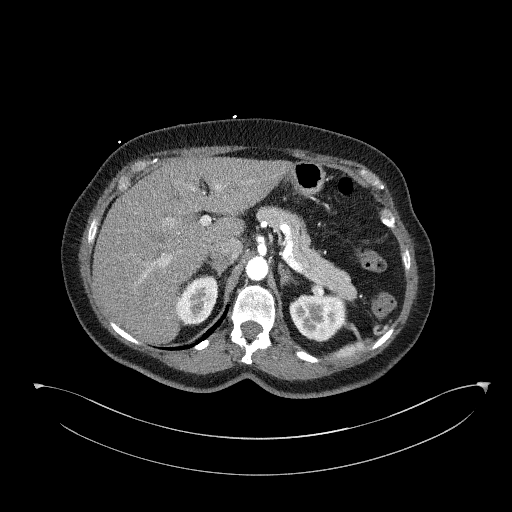
[im 149/224  soft-tissue]
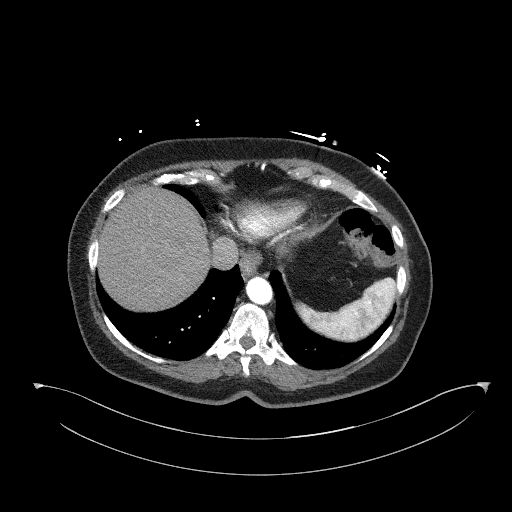
[im 168/224  soft-tissue]
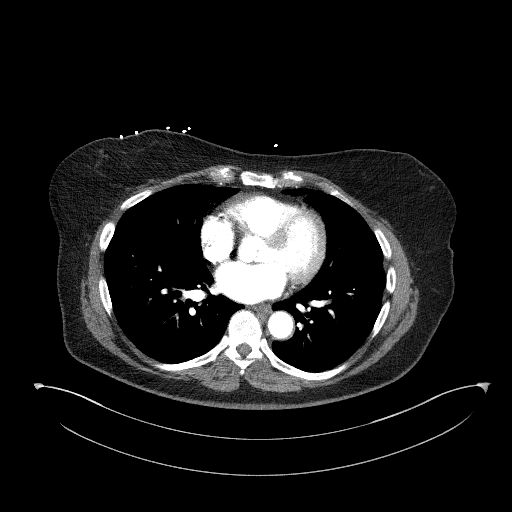
[im 168/224  bone]
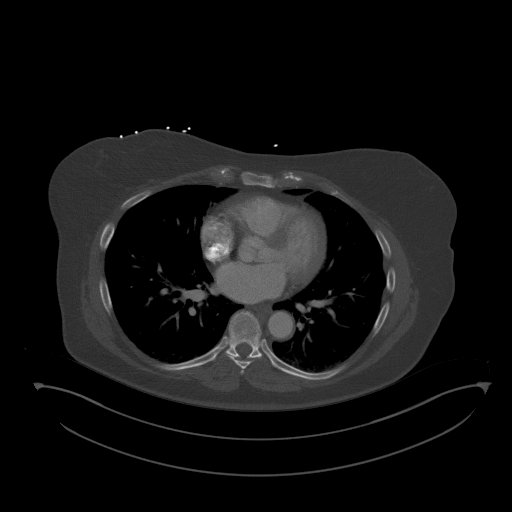
[im 186/224  soft-tissue]
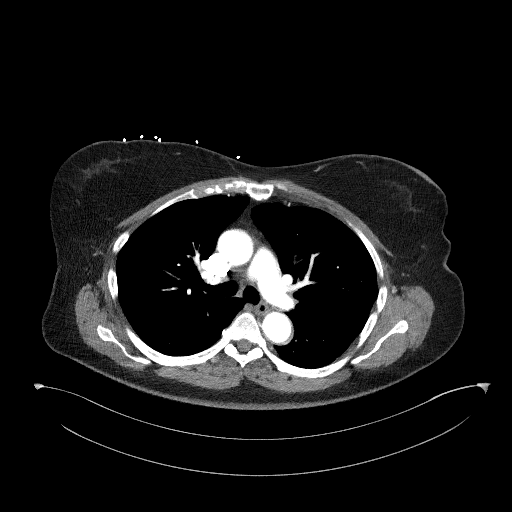
[im 205/224  soft-tissue]
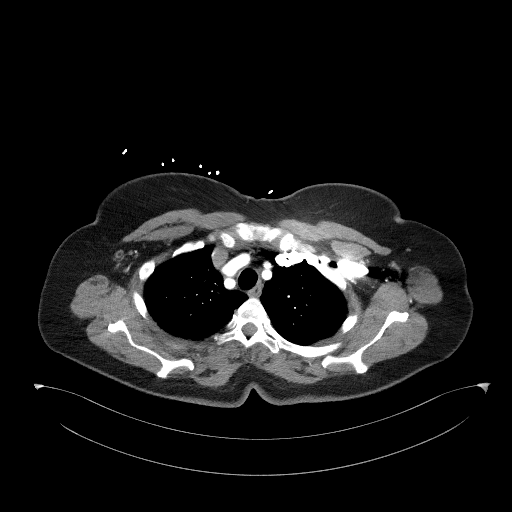

[Series 9: coronals · coronal · 0.92mm/px · 3 of 153 slices shown]
[im 39/153  soft-tissue]
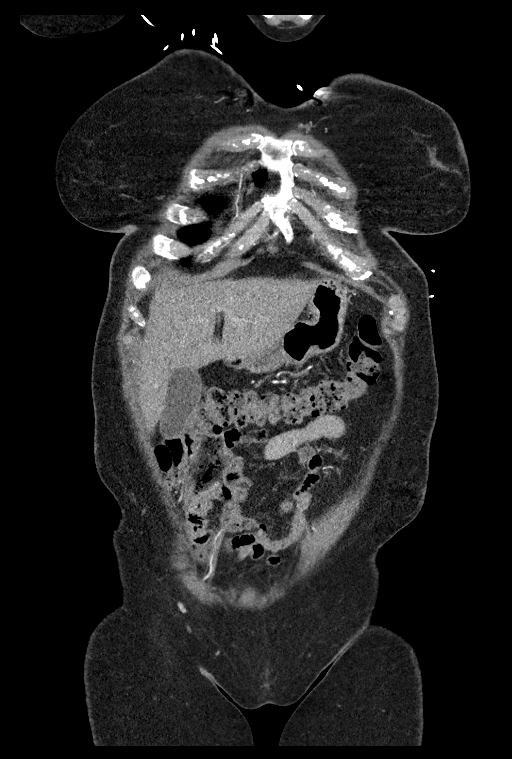
[im 77/153  soft-tissue]
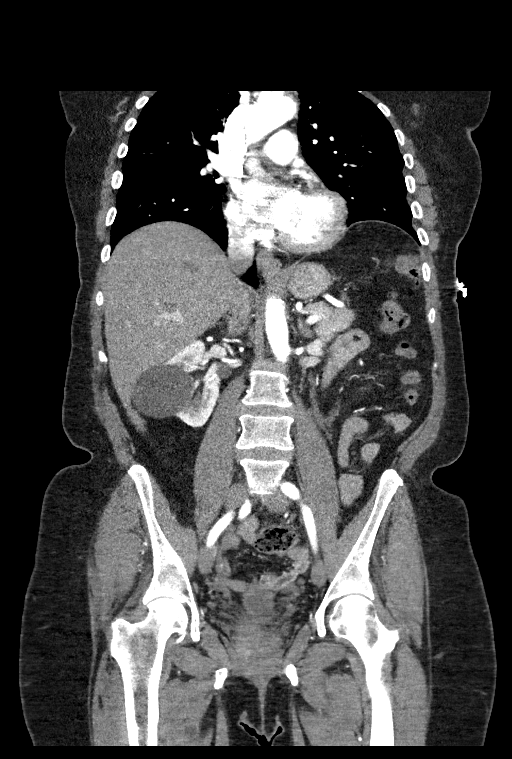
[im 115/153  soft-tissue]
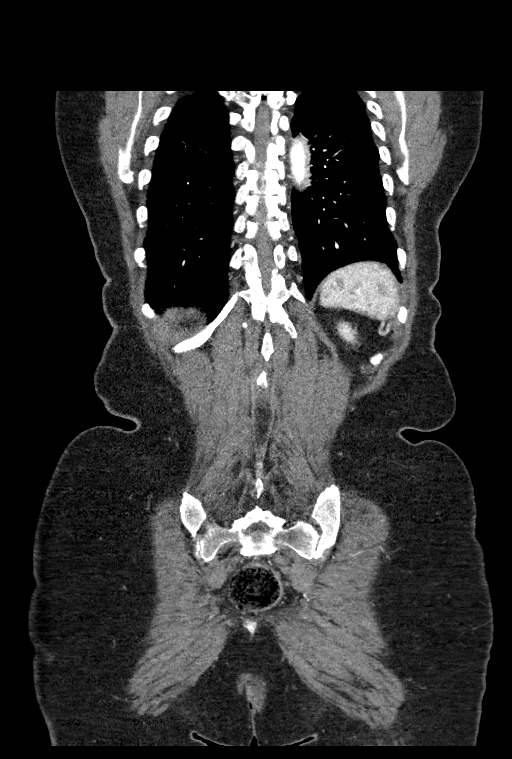

[14 of 46 positions shown; findings below may reference images not displayed]

FINDINGS: CTA CHEST FINDINGS

Cardiovascular: Preferential opacification of the thoracic aorta. No
evidence of thoracic aortic aneurysm or dissection. Normal heart
size. No pericardial effusion. No central pulmonary embolism.

Mediastinum/Nodes: No enlarged mediastinal, hilar, or axillary lymph
nodes. Thyroid gland, trachea, and esophagus demonstrate no
significant findings.

Lungs/Pleura: No focal consolidation, pleural effusion, or
pneumothorax. 6 x 4 mm pulmonary nodule in the right lower lobe
(series 8, image 28).

Musculoskeletal: No chest wall abnormality. No acute or significant
osseous findings. Suture anchor in the left greater tuberosity..

Review of the MIP images confirms the above findings.

CTA ABDOMEN AND PELVIS FINDINGS

VASCULAR

Aorta: Normal caliber aorta without aneurysm, dissection, vasculitis
or significant stenosis.

Celiac: Patent without evidence of aneurysm, dissection, vasculitis
or significant stenosis.

SMA: Patent without evidence of aneurysm, dissection, vasculitis or
significant stenosis.

Renals: Two right and single left renal arteries are patent without
evidence of aneurysm, dissection, vasculitis, fibromuscular
dysplasia or significant stenosis.

IMA: Patent without evidence of aneurysm, dissection, vasculitis or
significant stenosis.

Inflow: Patent without evidence of aneurysm, dissection, vasculitis
or significant stenosis.

Veins: No obvious venous abnormality within the limitations of this
arterial phase study.

Review of the MIP images confirms the above findings.

NON-VASCULAR

Hepatobiliary: No focal liver abnormality is seen. No gallstones,
gallbladder wall thickening, or biliary dilatation.

Pancreas: Unremarkable. No pancreatic ductal dilatation or
surrounding inflammatory changes.

Spleen: Normal in size without focal abnormality.

Adrenals/Urinary Tract: The adrenal glands are unremarkable. There
is a 6.1 cm simple cyst arising from the right kidney. The left
kidney is unremarkable. No renal or ureteral calculi. No
hydronephrosis. The bladder is normal.

Stomach/Bowel: Small hiatal hernia. The stomach is otherwise within
normal limits. No evidence of bowel wall thickening, distention, or
surrounding inflammatory changes.

Lymphatic: No abdominal or pelvic lymphadenopathy.

Reproductive: Status post hysterectomy. No adnexal masses.

Other: Tiny fat containing umbilical hernia. No free fluid or
pneumoperitoneum.

Musculoskeletal: No acute or significant osseous findings.

Review of the MIP images confirms the above findings.
IMPRESSION: Vascular:

1. No evidence of acute aortic pathology.  No aneurysm.

Chest:

1.  No acute intrathoracic process.
2. 5 mm pulmonary nodule in the right lower lobe. No follow-up
needed if patient is low-risk. Non-contrast chest CT can be
considered in 12 months if patient is high-risk. This recommendation
follows the consensus statement: Guidelines for Management of
Incidental Pulmonary Nodules Detected on CT Images: From the

Abdomen and pelvis:

1.  No acute intra-abdominal process.

## 2019-05-16 ENCOUNTER — Other Ambulatory Visit: Payer: Self-pay | Admitting: Psychiatry

## 2019-05-25 ENCOUNTER — Other Ambulatory Visit: Payer: Self-pay

## 2019-05-25 ENCOUNTER — Telehealth: Payer: Self-pay | Admitting: Psychiatry

## 2019-05-25 MED ORDER — ALPRAZOLAM 0.25 MG PO TABS: ORAL_TABLET | ORAL | 1 refills | Status: DC

## 2019-05-25 NOTE — Telephone Encounter (Signed)
Pt would like a refill on her Alprazolam. Please send to [facility] in [place].

## 2019-05-25 NOTE — Telephone Encounter (Signed)
Last refill 03/26/2019, Rx refill submitted

## 2019-06-28 ENCOUNTER — Telehealth: Payer: Self-pay | Admitting: Psychiatry

## 2019-06-28 ENCOUNTER — Other Ambulatory Visit: Payer: Self-pay

## 2019-06-28 DIAGNOSIS — F339 Major depressive disorder, recurrent, unspecified: Secondary | ICD-10-CM

## 2019-06-28 MED ORDER — METHYLPHENIDATE HCL ER (OSM) 54 MG PO TBCR: 54.0000 mg | EXTENDED_RELEASE_TABLET | ORAL | 0 refills | Status: DC

## 2019-06-28 MED ORDER — METHYLPHENIDATE HCL ER (OSM) 54 MG PO TBCR: 54.0000 mg | EXTENDED_RELEASE_TABLET | Freq: Every day | ORAL | 0 refills | Status: DC

## 2019-06-28 NOTE — Telephone Encounter (Signed)
Last refill 05/30/2019, pended 3 Rx's. Next apt 09/2019

## 2019-06-28 NOTE — Telephone Encounter (Signed)
Please RF COncerta RX to [facility], [place]. [address]

## 2019-07-12 ENCOUNTER — Other Ambulatory Visit: Payer: Self-pay

## 2019-07-12 ENCOUNTER — Telehealth: Payer: Self-pay | Admitting: Psychiatry

## 2019-07-12 MED ORDER — ALPRAZOLAM 0.25 MG PO TABS: ORAL_TABLET | ORAL | 3 refills | Status: DC

## 2019-07-12 NOTE — Telephone Encounter (Signed)
Patient called and left a message stating that she needs a refill on her xanax to be [facility] to [facility] on [address]

## 2019-07-12 NOTE — Telephone Encounter (Signed)
Rx called into her pharmacy.

## 2019-08-04 ENCOUNTER — Telehealth: Payer: Self-pay | Admitting: Psychiatry

## 2019-08-04 NOTE — Telephone Encounter (Signed)
Pt called requesting refill for Concerta @ [facility] [address]. Took last one

## 2019-08-04 NOTE — Telephone Encounter (Signed)
Pt was advised 2 Rx @ pharmacy. Check with pharmacy

## 2019-08-04 NOTE — Telephone Encounter (Signed)
Noted thank you

## 2019-10-06 ENCOUNTER — Other Ambulatory Visit: Payer: Self-pay

## 2019-10-06 ENCOUNTER — Telehealth: Payer: Self-pay | Admitting: Psychiatry

## 2019-10-06 DIAGNOSIS — F339 Major depressive disorder, recurrent, unspecified: Secondary | ICD-10-CM

## 2019-10-06 NOTE — Telephone Encounter (Signed)
Pt would like a refill on Concerta sent in at [facility] [place]. Appt. 9/14

## 2019-10-06 NOTE — Telephone Encounter (Signed)
Last refill 09/02/19 Pended for Shanda Bumps to send Patient has upcoming apt 10/11/19

## 2019-10-07 MED ORDER — METHYLPHENIDATE HCL ER (OSM) 54 MG PO TBCR: 54.0000 mg | EXTENDED_RELEASE_TABLET | ORAL | 0 refills | Status: DC

## 2019-10-10 ENCOUNTER — Ambulatory Visit: Payer: Medicare Other | Admitting: Psychiatry

## 2019-10-11 ENCOUNTER — Ambulatory Visit (INDEPENDENT_AMBULATORY_CARE_PROVIDER_SITE_OTHER): Payer: Medicare Other | Admitting: Psychiatry

## 2019-10-11 ENCOUNTER — Other Ambulatory Visit: Payer: Self-pay

## 2019-10-11 ENCOUNTER — Encounter: Payer: Self-pay | Admitting: Psychiatry

## 2019-10-11 DIAGNOSIS — F332 Major depressive disorder, recurrent severe without psychotic features: Secondary | ICD-10-CM | POA: Diagnosis not present

## 2019-10-11 DIAGNOSIS — G2581 Restless legs syndrome: Secondary | ICD-10-CM

## 2019-10-11 DIAGNOSIS — F339 Major depressive disorder, recurrent, unspecified: Secondary | ICD-10-CM | POA: Diagnosis not present

## 2019-10-11 MED ORDER — ALPRAZOLAM 0.25 MG PO TABS: ORAL_TABLET | ORAL | 3 refills | Status: DC

## 2019-10-11 MED ORDER — PRAMIPEXOLE DIHYDROCHLORIDE 1 MG PO TABS: 1.0000 mg | ORAL_TABLET | Freq: Every evening | ORAL | 1 refills | Status: DC

## 2019-10-11 MED ORDER — METHYLPHENIDATE HCL ER (OSM) 54 MG PO TBCR: 54.0000 mg | EXTENDED_RELEASE_TABLET | ORAL | 0 refills | Status: DC

## 2019-10-11 MED ORDER — METHYLPHENIDATE HCL ER (OSM) 54 MG PO TBCR: 54.0000 mg | EXTENDED_RELEASE_TABLET | Freq: Every day | ORAL | 0 refills | Status: DC

## 2019-10-11 MED ORDER — DULOXETINE HCL 60 MG PO CPEP: 120.0000 mg | ORAL_CAPSULE | Freq: Every day | ORAL | 1 refills | Status: DC

## 2019-10-11 NOTE — Progress Notes (Signed)
Rachael Callahan [id number] [birth year] 74 y.o.  Subjective:   Patient ID:  Rachael CorningBeverly Callahan is a 74 y.o. (DOB [birth year]) female.  Chief Complaint:  Chief Complaint  Patient presents with  . Follow-up    Medication Management  . Depression    Medication Management  . ADHD    Depression        Associated symptoms include no decreased concentration and no suicidal ideas.  Rachael Callahan presents to the office today for follow-up of TRD.  Prior addition of Concerta had helped with depression and productivity and interest and alertness.  visit August 2020.  She was doing generally well and no meds were changed.  03/2019 appt with the following noted: BP up here but reports it has been normal at home.   No new problems.  Still good On Concerta to 54 mg daily and duloxetine 120 mg since Jan 2020.  Has been doing well.  Overall the combination of meds is effective.  More productive and more interested and no longer depressed.  No longer napping.   Busy summer.  D in law [place] 2 gkids from [place] and that was great.  GS 8 years older than adopted GD and they do well together. Thinks TMS helped some also. RLS managed usually with daily pramipexole. Xanax use a couple times per month. Often over concerns with grand-daughter's mother who causes problems. Minimal anxiety .  Sleep good and less napping.  Patient reports stable mood and denies depressed or irritable moods.  Patient denies any recent difficulty with anxiety.  Patient denies difficulty with sleep initiation or maintenance. Night owl and gets 7-8 hours.  No napping.   Denies appetite disturbance.  Patient reports that energy and motivation have been good.  Patient denies any difficulty with concentration.  Patient denies any suicidal ideation. Concerned about GD who's not doing well with psych treatment.  Had seen Rosey Batheresa before but transferred.  Bipolar disorder dx.  [facility].  Also had hosp. Plan:  No meds  changed  10/11/19 appt with the following noted: Covid free.  Vaccinated. Pretty good mood. Satisfied with meds otherwise. Last week problems with RLS after being stable for awhile.  No more caffeine than usual. No SE. Patient reports stable mood and denies depressed or irritable moods.  Patient denies any recent difficulty with anxiety.  Patient denies difficulty with sleep initiation or maintenance. Denies appetite disturbance.  Patient reports that energy and motivation have been good.  Patient denies any difficulty with concentration.  Patient denies any suicidal ideation. Uses Xanax occ. [name] well. GD 26 and needs for insurance to see doc [facility].  Psychiatric medications tried include fluoxetine bupropion, lithium, Trintellix, venlafaxine, pramipexole 1, Latuda 40 mg, Rexulti 2 mg with no benefit, TMS, duloxetine 120 + Concerta 54..  Review of Systems:  Review of Systems  Cardiovascular: Negative for chest pain.  Neurological: Negative for dizziness, tremors and weakness.       RLS is temporarily worse.  Psychiatric/Behavioral: Positive for depression. Negative for agitation, behavioral problems, confusion, decreased concentration, dysphoric mood, hallucinations, self-injury, sleep disturbance and suicidal ideas. The patient is not nervous/anxious and is not hyperactive.     Medications: I have reviewed the patient's current medications.  Current Outpatient Medications  Medication Sig Dispense Refill  . Cholecalciferol (D 1000) 1000 units capsule Take by mouth.    . losartan (COZAAR) 100 MG tablet Take 100 mg by mouth daily.  1  . metoprolol succinate (TOPROL-XL)  50 MG 24 hr tablet Take 50 mg by mouth daily.    . Multiple Vitamins-Minerals (MULTIVITAMIN ADULT PO) Take by mouth.    [name] omeprazole (PRILOSEC) 40 MG capsule Take 40 mg by mouth daily.  1  . pravastatin (PRAVACHOL) 10 MG tablet Take 10 mg by mouth daily.  3  . Probiotic Product (PROBIOTIC-10 PO) Take by  mouth.    . ALPRAZolam (XANAX) 0.25 MG tablet Take 1-2 tablets everyday as needed anxiety 30 tablet 3  . DULoxetine (CYMBALTA) 60 MG capsule Take 2 capsules (120 mg total) by mouth daily. 180 capsule 1  . methylphenidate 54 MG PO CR tablet Take 1 tablet (54 mg total) by mouth every morning. 30 tablet 0  . [START ON 11/08/2019] methylphenidate 54 MG PO CR tablet Take 1 tablet (54 mg total) by mouth daily. 30 tablet 0  . [START ON 12/06/2019] methylphenidate 54 MG PO CR tablet Take 1 tablet (54 mg total) by mouth every morning. 30 tablet 0  . pramipexole (MIRAPEX) 1 MG tablet Take 1 tablet (1 mg total) by mouth at bedtime. 90 tablet 1   No current facility-administered medications for this visit.    Medication Side Effects: None  Allergies: No Known Allergies  Past Medical History:  Diagnosis Date  . Depression   . High cholesterol   . Hypertension   . Migraine   . Stomach ulcer     History reviewed. No pertinent family history.  Social History   Socioeconomic History  . Marital status: Married    Spouse name: Not on file  . Number of children: Not on file  . Years of education: Not on file  . Highest education level: Not on file  Occupational History  . Not on file  Tobacco Use  . Smoking status: Never Smoker  . Smokeless tobacco: Never Used  Vaping Use  . Vaping Use: Never used  Substance and Sexual Activity  . Alcohol use: Yes    Comment: rarely  . Drug use: Never  . Sexual activity: Not on file  Other Topics Concern  . Not on file  Social History Narrative  . Not on file   Social Determinants of Health   Financial Resource Strain:   . Difficulty of Paying Living Expenses: Not on file  Food Insecurity:   . Worried About Programme researcher, broadcasting/film/video in the Last Year: Not on file  . Ran Out of Food in the Last Year: Not on file  Transportation Needs:   . Lack of Transportation (Medical): Not on file  . Lack of Transportation (Non-Medical): Not on file  Physical  Activity:   . Days of Exercise per Week: Not on file  . Minutes of Exercise per Session: Not on file  Stress:   . Feeling of Stress : Not on file  Social Connections:   . Frequency of Communication with Friends and Family: Not on file  . Frequency of Social Gatherings with Friends and Family: Not on file  . Attends Religious Services: Not on file  . Active Member of Clubs or Organizations: Not on file  . Attends Banker Meetings: Not on file  . Marital Status: Not on file  Intimate Partner Violence:   . Fear of Current or Ex-Partner: Not on file  . Emotionally Abused: Not on file  . Physically Abused: Not on file  . Sexually Abused: Not on file    Past Medical History, Surgical history, Social history, and Family history were reviewed  and updated as appropriate.   Please see review of systems for further details on the patient's review from today.   Objective:   Physical Exam:  There were no vitals taken for this visit.  Physical Exam Constitutional:      General: She is not in acute distress.    Appearance: She is well-developed.  Musculoskeletal:        General: No deformity.  Neurological:     Mental Status: She is alert and oriented to person, place, and time.     Motor: No tremor.     Coordination: Coordination normal.     Gait: Gait normal.  Psychiatric:        Attention and Perception: Perception normal. She is attentive.        Mood and Affect: Mood is not anxious or depressed. Affect is not labile, blunt, angry, tearful or inappropriate.        Speech: Speech normal.        Behavior: Behavior normal.        Thought Content: Thought content normal. Thought content does not include homicidal or suicidal ideation. Thought content does not include homicidal or suicidal plan.        Cognition and Memory: Cognition normal.        Judgment: Judgment normal.     Comments: Insight intact. No auditory or visual hallucinations. No delusions.      Lab  Review:     Component Value Date/Time   NA 141 05/14/2017 0917   K 4.4 05/14/2017 0917   CL 104 05/14/2017 0917   CO2 28 05/14/2017 0917   GLUCOSE 112 (H) 05/14/2017 0917   BUN 16 05/14/2017 0917   CREATININE 0.69 05/14/2017 0917   CALCIUM 9.3 05/14/2017 0917   GFRNONAA >60 05/14/2017 0917   GFRAA >60 05/14/2017 0917       Component Value Date/Time   WBC 6.3 05/14/2017 0917   RBC 4.58 05/14/2017 0917   HGB 14.2 05/14/2017 0917   HCT 41.7 05/14/2017 0917   PLT 235 05/14/2017 0917   MCV 91.0 05/14/2017 0917   MCH 31.0 05/14/2017 0917   MCHC 34.1 05/14/2017 0917   RDW 13.1 05/14/2017 0917   LYMPHSABS 1.4 05/14/2017 0917   MONOABS 0.5 05/14/2017 0917   EOSABS 0.2 05/14/2017 0917   BASOSABS 0.0 05/14/2017 0917    No results found for: POCLITH, LITHIUM   No results found for: PHENYTOIN, PHENOBARB, VALPROATE, CBMZ   .res Assessment: Plan:    Rachael Callahan was seen today for follow-up, depression and adhd.  Diagnoses and all orders for this visit:  Recurrent major depression resistant to treatment (HCC) -     methylphenidate 54 MG PO CR tablet; Take 1 tablet (54 mg total) by mouth every morning. -     methylphenidate 54 MG PO CR tablet; Take 1 tablet (54 mg total) by mouth daily. -     methylphenidate 54 MG PO CR tablet; Take 1 tablet (54 mg total) by mouth every morning.  Restless legs syndrome -     ALPRAZolam (XANAX) 0.25 MG tablet; Take 1-2 tablets everyday as needed anxiety -     pramipexole (MIRAPEX) 1 MG tablet; Take 1 tablet (1 mg total) by mouth at bedtime.  Severe episode of recurrent major depressive disorder, without psychotic features (HCC) -     DULoxetine (CYMBALTA) 60 MG capsule; Take 2 capsules (120 mg total) by mouth daily.   Agree with increase in Concerta to 54mg  daily for TRD and  focus and attention. It helped. Especially re risks with the combination with duloxetine.  Talk with PCP re BP next week.  Discussed potential benefits, risks, and side  effects of stimulants with patient to include increased heart rate, palpitations, insomnia, increased anxiety, increased irritability, or decreased appetite.  Instructed patient to contact office if experiencing any significant tolerability issues. Watch BP. It is mildly elevated.  Still Recommended that she check it several times a week and give [place] that information.  Check at different times of day in order to determine if the Concerta is having an effect on it or perhaps the duloxetine she agreed  No med changes and she agrees.  Disc RLS less managed and already at sig dose of pramipexole so hesitate to increase.  Consider gabapentin.  She wants to defer.    Supportive therapy dealing with daughter with depression too. And also friend Dewayne Hatch who's coming here now too.  This appt was 30 mins.  FU 6 mos bc stable last 6 mos.  No evidence for SUD  Meredith Staggers, MD, DFAPA  Please see After Visit Summary for patient specific instructions.  No future appointments.  No orders of the defined types were placed in this encounter.     -------------------------------

## 2019-10-19 ENCOUNTER — Telehealth: Payer: Self-pay | Admitting: Psychiatry

## 2019-10-19 NOTE — Telephone Encounter (Signed)
Rx called in [facility] to [facility]

## 2019-10-19 NOTE — Telephone Encounter (Signed)
Pt said that the pharmacy never received the xanax prescription that was phoned in on 9/14. Please resend it since the pt has been out for a couple of days.

## 2019-11-05 ENCOUNTER — Other Ambulatory Visit: Payer: Self-pay | Admitting: Psychiatry

## 2019-11-05 DIAGNOSIS — F332 Major depressive disorder, recurrent severe without psychotic features: Secondary | ICD-10-CM

## 2019-11-07 ENCOUNTER — Telehealth: Payer: Self-pay | Admitting: Psychiatry

## 2019-11-07 NOTE — Telephone Encounter (Signed)
Patient has refills of Concerta on file at [facility], her Duloxetine was also sent.   Will have to clarify Abilify, not listed on her medication profile

## 2019-11-07 NOTE — Telephone Encounter (Signed)
Pt's next appt is 04/11/20. Requested refills on Concerta and Abilify and requesting 90 days if possible. [facility] [address], [place], [place].

## 2019-12-12 ENCOUNTER — Telehealth: Payer: Self-pay | Admitting: Psychiatry

## 2019-12-12 ENCOUNTER — Other Ambulatory Visit: Payer: Self-pay | Admitting: Psychiatry

## 2019-12-12 DIAGNOSIS — F339 Major depressive disorder, recurrent, unspecified: Secondary | ICD-10-CM

## 2019-12-12 DIAGNOSIS — G2581 Restless legs syndrome: Secondary | ICD-10-CM

## 2019-12-12 MED ORDER — ALPRAZOLAM 0.25 MG PO TABS: ORAL_TABLET | ORAL | 3 refills | Status: DC

## 2019-12-12 MED ORDER — METHYLPHENIDATE HCL ER (OSM) 54 MG PO TBCR: 54.0000 mg | EXTENDED_RELEASE_TABLET | ORAL | 0 refills | Status: DC

## 2019-12-12 MED ORDER — METHYLPHENIDATE HCL ER (OSM) 54 MG PO TBCR: 54.0000 mg | EXTENDED_RELEASE_TABLET | Freq: Every day | ORAL | 0 refills | Status: DC

## 2019-12-12 NOTE — Telephone Encounter (Signed)
Patient's next appt is 04/11/20 with Dr. Jennelle Human. Requesting a refill on Concerta and Alprazolam called to [facility] in [place], [place].

## 2019-12-12 NOTE — Telephone Encounter (Signed)
RX sent

## 2020-03-08 ENCOUNTER — Telehealth: Payer: Self-pay | Admitting: Psychiatry

## 2020-03-08 NOTE — Telephone Encounter (Signed)
Pt called and said that she is having a hard time getting out of bed. She has no energy or desire to want to do anything. She has an appt on 3/16. Please call her at [phone]

## 2020-03-08 NOTE — Telephone Encounter (Signed)
Rtc patient reports having no energy to get out of bed. I asked about depression and she reports she's not sure really but she's not having any motivation to get out of bed. No medication changes since last visit. She does have apt in about 4 weeks.  Informed her we would follow up with recommendation.

## 2020-03-08 NOTE — Telephone Encounter (Signed)
Cancellation list

## 2020-03-08 NOTE — Telephone Encounter (Signed)
Please make sure she puts the Concerta by her bed & sets and alarm and takes it before she gets out of the bed to help improve the odds that she is getting benefit from it and not skipping the Concerta.  Also make sure that she is not having any other medical problems that would suggest a medical cause for her fatigue and consider seeing her primary care doctor.  Especially because there has been some concern about her blood pressure.  Please ask if her blood pressure is known and if it is controlled.  If it is controlled we could consider increasing the Concerta until I can see her in the office.  Admin has been instructed to put her on cancellation list.

## 2020-03-09 ENCOUNTER — Other Ambulatory Visit: Payer: Self-pay | Admitting: Psychiatry

## 2020-03-09 DIAGNOSIS — F339 Major depressive disorder, recurrent, unspecified: Secondary | ICD-10-CM

## 2020-03-09 MED ORDER — METHYLPHENIDATE HCL ER (OSM) 36 MG PO TBCR
72.0000 mg | EXTENDED_RELEASE_TABLET | ORAL | 0 refills | Status: DC
Start: 2020-03-09 — End: 2020-04-11

## 2020-03-09 NOTE — Telephone Encounter (Signed)
She is aware and Rachael Callahan I believe she needs a prior authorization.She says she got a notification that it was $180 but she normally pays much less.

## 2020-03-09 NOTE — Telephone Encounter (Signed)
Will follow up on prior authorization.

## 2020-03-09 NOTE — Telephone Encounter (Signed)
Tell her I sent in RX Concerta for higher dose.  We'll try that first

## 2020-03-09 NOTE — Telephone Encounter (Signed)
FYI,  last Concerta refill was 02/09/20

## 2020-03-09 NOTE — Telephone Encounter (Signed)
She says she takes the Concerta every morning.She does not have any other medical problems going on currently.She went to PCP about 2 weeks ago and blood pressure was good,she is also taking medication for it.She said her son brought her blood pressure cuff so she will be able to monitor it more.She also said she will need a refill of Concerta soon.

## 2020-03-12 NOTE — Telephone Encounter (Signed)
Contacted her pharmacy and they report her co-pay is $20.00 so nothing is needed. Not sure where the higher amount came from.

## 2020-04-11 ENCOUNTER — Ambulatory Visit (INDEPENDENT_AMBULATORY_CARE_PROVIDER_SITE_OTHER): Payer: Medicare Other | Admitting: Psychiatry

## 2020-04-11 ENCOUNTER — Encounter: Payer: Self-pay | Admitting: Psychiatry

## 2020-04-11 ENCOUNTER — Other Ambulatory Visit: Payer: Self-pay

## 2020-04-11 VITALS — BP 129/77 | HR 101

## 2020-04-11 DIAGNOSIS — G2581 Restless legs syndrome: Secondary | ICD-10-CM

## 2020-04-11 DIAGNOSIS — F332 Major depressive disorder, recurrent severe without psychotic features: Secondary | ICD-10-CM | POA: Diagnosis not present

## 2020-04-11 DIAGNOSIS — F339 Major depressive disorder, recurrent, unspecified: Secondary | ICD-10-CM | POA: Diagnosis not present

## 2020-04-11 MED ORDER — METHYLPHENIDATE HCL ER (OSM) 36 MG PO TBCR: 72.0000 mg | EXTENDED_RELEASE_TABLET | Freq: Every day | ORAL | 0 refills | Status: DC

## 2020-04-11 MED ORDER — METHYLPHENIDATE HCL ER (OSM) 36 MG PO TBCR
72.0000 mg | EXTENDED_RELEASE_TABLET | ORAL | 0 refills | Status: DC
Start: 2020-04-11 — End: 2020-07-24

## 2020-04-11 MED ORDER — DULOXETINE HCL 60 MG PO CPEP: 120.0000 mg | ORAL_CAPSULE | Freq: Every day | ORAL | 1 refills | Status: DC

## 2020-04-11 MED ORDER — PRAMIPEXOLE DIHYDROCHLORIDE 1 MG PO TABS: 1.0000 mg | ORAL_TABLET | Freq: Every evening | ORAL | 1 refills | Status: DC

## 2020-04-11 NOTE — Progress Notes (Signed)
Rachael Callahan [id number] 14-Jul-1945 75 y.o.  Subjective:   Patient ID:  Rachael Callahan is a 75 y.o. (DOB 01/12/1946) female.  Chief Complaint:  Chief Complaint  Patient presents with  . Follow-up  . Recurrent major depression resistant to treatment [facility])    Depression        Associated symptoms include no decreased concentration and no suicidal ideas.  [name] presents to the office today for follow-up of TRD.  Prior addition of Concerta had helped with depression and productivity and interest and alertness.  visit August 2020.  She was doing generally well and no meds were changed.  03/2019 appt with the following noted: BP up here but reports it has been normal at home.   No new problems.  Still good On Concerta to 54 mg daily and duloxetine 120 mg since Jan 2020.  Has been doing well.  Overall the combination of meds is effective.  More productive and more interested and no longer depressed.  No longer napping.   Busy summer.  D in law brought 2 gkids from [place] and that was great.  GS 8 years older than adopted GD and they do well together. Thinks TMS helped some also. RLS managed usually with daily pramipexole. Xanax use a couple times per month. Often over concerns with grand-daughter's mother who causes problems. Minimal anxiety .  Sleep good and less napping.  Patient reports stable mood and denies depressed or irritable moods.  Patient denies any recent difficulty with anxiety.  Patient denies difficulty with sleep initiation or maintenance. Night owl and gets 7-8 hours.  No napping.   Denies appetite disturbance.  Patient reports that energy and motivation have been good.  Patient denies any difficulty with concentration.  Patient denies any suicidal ideation. Concerned about GD who's not doing well with psych treatment.  Had seen [name] before but transferred.  Bipolar disorder dx.  Seen at [facility].  Also had hosp. Plan:  No meds changed  10/11/19 appt  with the following noted: Covid free.  Vaccinated. Pretty good mood. Satisfied with meds otherwise. Last week problems with RLS after being stable for awhile.  No more caffeine than usual. No SE. Patient reports stable mood and denies depressed or irritable moods.  Patient denies any recent difficulty with anxiety.  Patient denies difficulty with sleep initiation or maintenance. Denies appetite disturbance.  Patient reports that energy and motivation have been good.  Patient denies any difficulty with concentration.  Patient denies any suicidal ideation. Uses Xanax occ. [name] well. GD 26 and needs for insurance to see doc in [facility]. Plan: no changes  04/11/2020 appointment with the following noted: Since here concerta increased to 72 mg AM.  I think it's what I needed.  More motivated and energetic and productive but not normal. Still on duloxetine 120 without SE.   Mood good.  Patient reports stable mood and denies depressed or irritable moods.  Patient denies any recent difficulty with anxiety.  Patient denies difficulty with sleep initiation or maintenance, but night owl so sleep is messed up with retirement. Denies appetite disturbance.  Patient reports that energy and motivation have been good.  Patient denies any difficulty with concentration.  Patient denies any suicidal ideation. RLS managed.    Psychiatric medications tried include fluoxetine bupropion, lithium, Trintellix, venlafaxine, pramipexole 1, Latuda 40 mg, Rexulti 2 mg with no benefit, TMS, duloxetine 120 + Concerta 54, pramipexole 1 mg HS.[name]  Review of Systems:  Review of  Systems  Cardiovascular: Negative for chest pain.  Musculoskeletal: Positive for arthralgias.  Neurological: Negative for dizziness, tremors and weakness.       RLS is temporarily worse.  Psychiatric/Behavioral: Positive for depression. Negative for agitation, behavioral problems, confusion, decreased concentration, dysphoric mood, hallucinations,  self-injury, sleep disturbance and suicidal ideas. The patient is not nervous/anxious and is not hyperactive.     Medications: I have reviewed the patient's current medications.  Current Outpatient Medications  Medication Sig Dispense Refill  . ALPRAZolam (XANAX) 0.25 MG tablet Take 1-2 tablets everyday as needed anxiety 30 tablet 3  . Cholecalciferol 25 MCG (1000 UT) capsule Take by mouth.    . DULoxetine (CYMBALTA) 60 MG capsule TAKE 2 CAPSULES BY MOUTH DAILY 180 capsule 1  . losartan (COZAAR) 100 MG tablet Take 100 mg by mouth daily.  1  . methylphenidate 36 MG PO CR tablet Take 2 tablets (72 mg total) by mouth every morning. 60 tablet 0  . metoprolol succinate (TOPROL-XL) 50 MG 24 hr tablet Take 50 mg by mouth daily.    . Multiple Vitamins-Minerals (MULTIVITAMIN ADULT PO) Take by mouth.    . pramipexole (MIRAPEX) 1 MG tablet Take 1 tablet (1 mg total) by mouth at bedtime. 90 tablet 1  . pravastatin (PRAVACHOL) 10 MG tablet Take 10 mg by mouth daily.  3  . Probiotic Product (PROBIOTIC-10 PO) Take by mouth.    [name] omeprazole (PRILOSEC) 40 MG capsule Take 40 mg by mouth daily. (Patient not taking: Reported on 04/11/2020)  1   No current facility-administered medications for this visit.    Medication Side Effects: None  Allergies: No Known Allergies  Past Medical History:  Diagnosis Date  . Depression   . High cholesterol   . Hypertension   . Migraine   . Stomach ulcer     History reviewed. No pertinent family history.  Social History   Socioeconomic History  . Marital status: Married    Spouse name: Not on file  . Number of children: Not on file  . Years of education: Not on file  . Highest education level: Not on file  Occupational History  . Not on file  Tobacco Use  . Smoking status: Never Smoker  . Smokeless tobacco: Never Used  Vaping Use  . Vaping Use: Never used  Substance and Sexual Activity  . Alcohol use: Yes    Comment: rarely  . Drug use: Never  .  Sexual activity: Not on file  Other Topics Concern  . Not on file  Social History Narrative  . Not on file   Social Determinants of Health   Financial Resource Strain: Not on file  Food Insecurity: Not on file  Transportation Needs: Not on file  Physical Activity: Not on file  Stress: Not on file  Social Connections: Not on file  Intimate Partner Violence: Not on file    Past Medical History, Surgical history, Social history, and Family history were reviewed and updated as appropriate.   Please see review of systems for further details on the patient's review from today.   Objective:   Physical Exam:  BP 129/77   Pulse (!) 101   Physical Exam Constitutional:      General: She is not in acute distress.    Appearance: She is well-developed.  Musculoskeletal:        General: No deformity.  Neurological:     Mental Status: She is alert and oriented to person, place, and time.  Motor: No tremor.     Coordination: Coordination normal.     Gait: Gait normal.  Psychiatric:        Attention and Perception: Perception normal. She is attentive.        Mood and Affect: Mood is not anxious or depressed. Affect is not labile, blunt, angry, tearful or inappropriate.        Speech: Speech normal.        Behavior: Behavior normal.        Thought Content: Thought content normal. Thought content does not include homicidal or suicidal ideation. Thought content does not include homicidal or suicidal plan.        Cognition and Memory: Cognition normal.        Judgment: Judgment normal.     Comments: Insight intact. No auditory or visual hallucinations. No delusions.      Lab Review:     Component Value Date/Time   NA 141 05/14/2017 0917   K 4.4 05/14/2017 0917   CL 104 05/14/2017 0917   CO2 28 05/14/2017 0917   GLUCOSE 112 (H) 05/14/2017 0917   BUN 16 05/14/2017 0917   CREATININE 0.69 05/14/2017 0917   CALCIUM 9.3 05/14/2017 0917   GFRNONAA >60 05/14/2017 0917   GFRAA >60  05/14/2017 0917       Component Value Date/Time   WBC 6.3 05/14/2017 0917   RBC 4.58 05/14/2017 0917   HGB 14.2 05/14/2017 0917   HCT 41.7 05/14/2017 0917   PLT 235 05/14/2017 0917   MCV 91.0 05/14/2017 0917   MCH 31.0 05/14/2017 0917   MCHC 34.1 05/14/2017 0917   RDW 13.1 05/14/2017 0917   LYMPHSABS 1.4 05/14/2017 0917   MONOABS 0.5 05/14/2017 0917   EOSABS 0.2 05/14/2017 0917   BASOSABS 0.0 05/14/2017 0917    No results found for: POCLITH, LITHIUM   No results found for: PHENYTOIN, PHENOBARB, VALPROATE, CBMZ   .res Assessment: Plan:    Haya was seen today for follow-up and recurrent major depression resistant to treatment (hcc).  Diagnoses and all orders for this visit:  Recurrent major depression resistant to treatment (HCC)  Restless legs syndrome   Agree with increase in Concerta to 72 mg daily for TRD and focus and attention. It helped. Especially re risks with the combination with duloxetine.   BP OK today.  Discussed potential benefits, risks, and side effects of stimulants with patient to include increased heart rate, palpitations, insomnia, increased anxiety, increased irritability, or decreased appetite.  Instructed patient to contact office if experiencing any significant tolerability issues. Watch BP. It is mildly elevated.  Still Recommended that she check it several times a week and give [place] that information.  Check at different times of day in order to determine if the Concerta is having an effect on it or perhaps the duloxetine she agreed  No med changes and she agrees.  Disc RLS less managed and already at sig dose of pramipexole so hesitate to increase.  Consider gabapentin.  She wants to defer.    Supportive therapy dealing with daughter with depression too. And also friend Dewayne Hatch who's coming here now too. Sleep hygiene discussed.  RLS managed  Asks for refill omeprazole.  Disc risk cog problems over time with it.  After this talk to PCP about  it.  This appt was 30 mins.  FU 6 mos bc stable last 6 mos.  No evidence for SUD  Meredith Staggers, MD, DFAPA  Please see After Visit Summary for patient  specific instructions.  No future appointments.  No orders of the defined types were placed in this encounter.     -------------------------------

## 2020-06-27 ENCOUNTER — Other Ambulatory Visit: Payer: Self-pay | Admitting: Psychiatry

## 2020-06-27 DIAGNOSIS — G2581 Restless legs syndrome: Secondary | ICD-10-CM

## 2020-07-24 ENCOUNTER — Other Ambulatory Visit: Payer: Self-pay

## 2020-07-24 ENCOUNTER — Telehealth: Payer: Self-pay | Admitting: Psychiatry

## 2020-07-24 DIAGNOSIS — F339 Major depressive disorder, recurrent, unspecified: Secondary | ICD-10-CM

## 2020-07-24 MED ORDER — METHYLPHENIDATE HCL ER (OSM) 36 MG PO TBCR: 72.0000 mg | EXTENDED_RELEASE_TABLET | Freq: Every day | ORAL | 0 refills | Status: DC

## 2020-07-24 MED ORDER — METHYLPHENIDATE HCL ER (OSM) 36 MG PO TBCR: 72.0000 mg | EXTENDED_RELEASE_TABLET | ORAL | 0 refills | Status: DC

## 2020-07-24 NOTE — Telephone Encounter (Signed)
Pended.

## 2020-07-24 NOTE — Telephone Encounter (Signed)
Pt would like a refill on Concerta 36mg . Pt is completely out. [facility] to [facility] [place].

## 2020-08-21 ENCOUNTER — Telehealth: Payer: Self-pay | Admitting: Psychiatry

## 2020-08-21 ENCOUNTER — Other Ambulatory Visit: Payer: Self-pay

## 2020-08-21 DIAGNOSIS — F339 Major depressive disorder, recurrent, unspecified: Secondary | ICD-10-CM

## 2020-08-21 NOTE — Telephone Encounter (Signed)
Next visit is 10/11/20. Requesting refill on Concerta called to:  [id number] [address] [address], [place] - [address]  Phone:  [phone]  Fax:  [phone]

## 2020-08-21 NOTE — Telephone Encounter (Signed)
Pended.

## 2020-08-22 MED ORDER — METHYLPHENIDATE HCL ER (OSM) 36 MG PO TBCR: 72.0000 mg | EXTENDED_RELEASE_TABLET | Freq: Every day | ORAL | 0 refills | Status: DC

## 2020-08-22 MED ORDER — METHYLPHENIDATE HCL ER (OSM) 36 MG PO TBCR: 72.0000 mg | EXTENDED_RELEASE_TABLET | ORAL | 0 refills | Status: DC

## 2020-09-21 ENCOUNTER — Other Ambulatory Visit: Payer: Self-pay | Admitting: Psychiatry

## 2020-09-21 DIAGNOSIS — G2581 Restless legs syndrome: Secondary | ICD-10-CM

## 2020-09-21 DIAGNOSIS — F332 Major depressive disorder, recurrent severe without psychotic features: Secondary | ICD-10-CM

## 2020-09-27 ENCOUNTER — Other Ambulatory Visit: Payer: Self-pay

## 2020-09-27 ENCOUNTER — Telehealth: Payer: Self-pay | Admitting: Psychiatry

## 2020-09-27 DIAGNOSIS — F339 Major depressive disorder, recurrent, unspecified: Secondary | ICD-10-CM

## 2020-09-27 MED ORDER — METHYLPHENIDATE HCL ER (OSM) 36 MG PO TBCR: 72.0000 mg | EXTENDED_RELEASE_TABLET | Freq: Every day | ORAL | 0 refills | Status: DC

## 2020-09-27 NOTE — Telephone Encounter (Signed)
Next visit is 10/11/20. Requesting refill on her Concerta. [facility]. Please call refill in to:  [id number] [address], [place] - [address]  Phone:  [phone]  Fax:  [phone]

## 2020-09-27 NOTE — Telephone Encounter (Signed)
Pended.

## 2020-10-11 ENCOUNTER — Other Ambulatory Visit: Payer: Self-pay

## 2020-10-11 ENCOUNTER — Ambulatory Visit (INDEPENDENT_AMBULATORY_CARE_PROVIDER_SITE_OTHER): Payer: Medicare Other | Admitting: Psychiatry

## 2020-10-11 ENCOUNTER — Encounter: Payer: Self-pay | Admitting: Psychiatry

## 2020-10-11 DIAGNOSIS — F339 Major depressive disorder, recurrent, unspecified: Secondary | ICD-10-CM

## 2020-10-11 DIAGNOSIS — G2581 Restless legs syndrome: Secondary | ICD-10-CM | POA: Diagnosis not present

## 2020-10-11 DIAGNOSIS — F332 Major depressive disorder, recurrent severe without psychotic features: Secondary | ICD-10-CM | POA: Diagnosis not present

## 2020-10-11 MED ORDER — PRAMIPEXOLE DIHYDROCHLORIDE 1 MG PO TABS: 1.0000 mg | ORAL_TABLET | Freq: Every evening | ORAL | 1 refills | Status: DC

## 2020-10-11 MED ORDER — METHYLPHENIDATE HCL ER (OSM) 36 MG PO TBCR: 72.0000 mg | EXTENDED_RELEASE_TABLET | ORAL | 0 refills | Status: DC

## 2020-10-11 MED ORDER — DULOXETINE HCL 60 MG PO CPEP: 120.0000 mg | ORAL_CAPSULE | Freq: Every day | ORAL | 1 refills | Status: DC

## 2020-10-11 MED ORDER — METHYLPHENIDATE HCL ER (OSM) 36 MG PO TBCR: 72.0000 mg | EXTENDED_RELEASE_TABLET | Freq: Every day | ORAL | 0 refills | Status: DC

## 2020-10-11 NOTE — Progress Notes (Signed)
Rachael Callahan [id number] 1945-06-24 75 y.o.  Subjective:   Patient ID:  Rachael Callahan is a 75 y.o. (DOB 1945/11/19) female.  Chief Complaint:  Chief Complaint  Patient presents with   Follow-up   Sleeping Problem   Depression    Depression        Associated symptoms include no decreased concentration and no suicidal ideas. Rachael Callahan presents to the office today for follow-up of TRD.  Prior addition of Concerta had helped with depression and productivity and interest and alertness.  visit August 2020.  She was doing generally well and no meds were changed.  03/2019 appt with the following noted: BP up here but reports it has been normal at home.   No new problems.  Still good On Concerta to 54 mg daily and duloxetine 120 mg since Jan 2020.  Has been doing well.  Overall the combination of meds is effective.  More productive and more interested and no longer depressed.  No longer napping.   Busy summer.  D in law brought 2 gkids from [place] and that was great.  GS 8 years older than adopted GD and they do well together. Thinks TMS helped some also. RLS managed usually with daily pramipexole. Xanax use a couple times per month. Often over concerns with grand-daughter's mother who causes problems. Minimal anxiety .  Sleep good and less napping.  Patient reports stable mood and denies depressed or irritable moods.  Patient denies any recent difficulty with anxiety.  Patient denies difficulty with sleep initiation or maintenance. Night owl and gets 7-8 hours.  No napping.   Denies appetite disturbance.  Patient reports that energy and motivation have been good.  Patient denies any difficulty with concentration.  Patient denies any suicidal ideation. Concerned about GD who's not doing well with psych treatment.  Had seen Rachael Callahan before but transferred.  Bipolar disorder dx.  Seen at [facility].  Also had hosp.  Plan:  No meds changed  10/11/19 appt with the following noted: Covid  free.  Vaccinated. Pretty good mood. Satisfied with meds otherwise. Last week problems with RLS after being stable for awhile.  No more caffeine than usual. No SE. Patient reports stable mood and denies depressed or irritable moods.  Patient denies any recent difficulty with anxiety.  Patient denies difficulty with sleep initiation or maintenance. Denies appetite disturbance.  Patient reports that energy and motivation have been good.  Patient denies any difficulty with concentration.  Patient denies any suicidal ideation. Uses Xanax occ. [name] well. GD 26 and needs for insurance to see doc in [facility]. Plan: no changes  04/11/2020 appointment with the following noted: Since here concerta increased to 72 mg AM.  I think it's what I needed.  More motivated and energetic and productive but not normal. Still on duloxetine 120 without SE.   Mood good.  Patient reports stable mood and denies depressed or irritable moods.  Patient denies any recent difficulty with anxiety.  Patient denies difficulty with sleep initiation or maintenance, but night owl so sleep is messed up with retirement. Denies appetite disturbance.  Patient reports that energy and motivation have been good.  Patient denies any difficulty with concentration.  Patient denies any suicidal ideation. RLS managed.   Plan :  No med changes and she agrees.  10/11/2020 appointment with the following noted: Xanax 3-4 times per month. RLS managed with pramipexole and muscle relaxing cream. No sig depression.  Satisfied with meds.  Occ situational mood  problems. Patient reports stable mood and denies depressed or irritable moods.  Patient denies any recent difficulty with anxiety.  Patient denies difficulty with sleep initiation or maintenance. Denies appetite disturbance.  Patient reports that energy and motivation have been good.  Patient denies any difficulty with concentration.  Patient denies any suicidal ideation. Stress GD married  last year to cocaine addict. No SE Initial insomnia at night but can sleep better in the day. 1 cup AM coffee.  Psychiatric medications tried include fluoxetine bupropion, lithium, Trintellix, venlafaxine, duloxetine 120 + Concerta 54, pramipexole 1,  Latuda 40 mg, Rexulti 2 mg with no benefit,  TMS,    Review of Systems:  Review of Systems  Cardiovascular:  Negative for chest pain.  Musculoskeletal:  Positive for arthralgias.  Neurological:  Negative for dizziness, tremors and weakness.       RLS is temporarily worse.  Psychiatric/Behavioral:  Positive for depression. Negative for agitation, behavioral problems, confusion, decreased concentration, dysphoric mood, hallucinations, self-injury, sleep disturbance and suicidal ideas. The patient is not nervous/anxious and is not hyperactive.    Medications: I have reviewed the patient's current medications.  Current Outpatient Medications  Medication Sig Dispense Refill   ALPRAZolam (XANAX) 0.25 MG tablet TAKE 1-2 TABLETS EVERYDAY AS NEEDED ANXIETY 30 tablet 3   Cholecalciferol 25 MCG (1000 UT) capsule Take by mouth.     losartan (COZAAR) 100 MG tablet Take 100 mg by mouth daily.  1   metoprolol succinate (TOPROL-XL) 50 MG 24 hr tablet Take 50 mg by mouth daily.     Multiple Vitamins-Minerals (MULTIVITAMIN ADULT PO) Take by mouth.     omeprazole (PRILOSEC) 40 MG capsule Take 40 mg by mouth daily.  1   pravastatin (PRAVACHOL) 10 MG tablet Take 10 mg by mouth daily.  3   Probiotic Product (PROBIOTIC-10 PO) Take by mouth.     DULoxetine (CYMBALTA) 60 MG capsule Take 2 capsules (120 mg total) by mouth daily. 180 capsule 1   [START ON 12/06/2020] methylphenidate 36 MG PO CR tablet Take 2 tablets (72 mg total) by mouth every morning. 60 tablet 0   [START ON 11/08/2020] methylphenidate 36 MG PO CR tablet Take 2 tablets (72 mg total) by mouth daily. 60 tablet 0   methylphenidate 36 MG PO CR tablet Take 2 tablets (72 mg total) by mouth daily. 60  tablet 0   pramipexole (MIRAPEX) 1 MG tablet Take 1 tablet (1 mg total) by mouth at bedtime. 90 tablet 1   No current facility-administered medications for this visit.    Medication Side Effects: None  Allergies: No Known Allergies  Past Medical History:  Diagnosis Date   Depression    High cholesterol    Hypertension    Migraine    Stomach ulcer     History reviewed. No pertinent family history.  Social History   Socioeconomic History   Marital status: Married    Spouse name: Not on file   Number of children: Not on file   Years of education: Not on file   Highest education level: Not on file  Occupational History   Not on file  Tobacco Use   Smoking status: Never   Smokeless tobacco: Never  Vaping Use   Vaping Use: Never used  Substance and Sexual Activity   Alcohol use: Yes    Comment: rarely   Drug use: Never   Sexual activity: Not on file  Other Topics Concern   Not on file  Social History Narrative  Not on file   Social Determinants of Health   Financial Resource Strain: Not on file  Food Insecurity: Not on file  Transportation Needs: Not on file  Physical Activity: Not on file  Stress: Not on file  Social Connections: Not on file  Intimate Partner Violence: Not on file    Past Medical History, Surgical history, Social history, and Family history were reviewed and updated as appropriate.   Please see review of systems for further details on the patient's review from today.   Objective:   Physical Exam:  There were no vitals taken for this visit.  Physical Exam Constitutional:      General: She is not in acute distress.    Appearance: She is well-developed.  Musculoskeletal:        General: No deformity.  Neurological:     Mental Status: She is alert and oriented to person, place, and time.     Motor: No tremor.     Coordination: Coordination normal.     Gait: Gait normal.  Psychiatric:        Attention and Perception: Perception  normal. She is attentive.        Mood and Affect: Mood is not anxious or depressed. Affect is not labile, blunt, angry or inappropriate.        Speech: Speech normal.        Behavior: Behavior normal.        Thought Content: Thought content normal. Thought content does not include homicidal or suicidal ideation. Thought content does not include homicidal or suicidal plan.        Cognition and Memory: Cognition normal.        Judgment: Judgment normal.     Comments: Insight intact. No auditory or visual hallucinations. No delusions.  Talkative without pressure    Lab Review:     Component Value Date/Time   NA 141 05/14/2017 0917   K 4.4 05/14/2017 0917   CL 104 05/14/2017 0917   CO2 28 05/14/2017 0917   GLUCOSE 112 (H) 05/14/2017 0917   BUN 16 05/14/2017 0917   CREATININE 0.69 05/14/2017 0917   CALCIUM 9.3 05/14/2017 0917   GFRNONAA >60 05/14/2017 0917   GFRAA >60 05/14/2017 0917       Component Value Date/Time   WBC 6.3 05/14/2017 0917   RBC 4.58 05/14/2017 0917   HGB 14.2 05/14/2017 0917   HCT 41.7 05/14/2017 0917   PLT 235 05/14/2017 0917   MCV 91.0 05/14/2017 0917   MCH 31.0 05/14/2017 0917   MCHC 34.1 05/14/2017 0917   RDW 13.1 05/14/2017 0917   LYMPHSABS 1.4 05/14/2017 0917   MONOABS 0.5 05/14/2017 0917   EOSABS 0.2 05/14/2017 0917   BASOSABS 0.0 05/14/2017 0917    No results found for: POCLITH, LITHIUM   No results found for: PHENYTOIN, PHENOBARB, VALPROATE, CBMZ   .res Assessment: Plan:    Tristin was seen today for follow-up, sleeping problem and depression.  Diagnoses and all orders for this visit:  Recurrent major depression resistant to treatment (HCC) -     methylphenidate 36 MG PO CR tablet; Take 2 tablets (72 mg total) by mouth every morning. -     methylphenidate 36 MG PO CR tablet; Take 2 tablets (72 mg total) by mouth daily. -     methylphenidate 36 MG PO CR tablet; Take 2 tablets (72 mg total) by mouth daily.  Restless legs syndrome -      pramipexole (MIRAPEX) 1 MG tablet; Take  1 tablet (1 mg total) by mouth at bedtime.  Severe episode of recurrent major depressive disorder, without psychotic features (HCC) -     DULoxetine (CYMBALTA) 60 MG capsule; Take 2 capsules (120 mg total) by mouth daily.  Greater than 50% of 30 min face to face time with patient was spent on counseling and coordination of care. We discussed Agree with increase in Concerta to 72 mg daily for TRD and focus and attention. It helped. Especially re risks with the combination with duloxetine.   BP OK today.  Discussed potential benefits, risks, and side effects of stimulants with patient to include increased heart rate, palpitations, insomnia, increased anxiety, increased irritability, or decreased appetite.  Instructed patient to contact office if experiencing any significant tolerability issues. Watch BP. It is mildly elevated.  Still Recommended that she check it several times a week and give [place] that information.  Check at different times of day in order to determine if the Concerta is having an effect on it or perhaps the duloxetine she agreed Continue duloxetine 120 mg daily  No med changes and she agrees.  Disc RLS less managed and already at sig dose of pramipexole 1 mg  so hesitate to increase.  Consider gabapentin.  She wants to defer.   Disc SE including rare risks of compulsive behaviors.  Supportive therapy dealing with daughter with depression too. And also friend Dewayne Hatch who's coming here now too.  And with GD. Sleep hygiene discussed. Option trazodone for sleep.  This appt was 30 mins.  FU 6 mos bc stable last 6 mos.  No evidence for SUD  Meredith Staggers, MD, DFAPA  Please see After Visit Summary for patient specific instructions.  Future Appointments  Date Time Provider Department Center  04/09/2021  1:30 PM Cottle, Steva Ready., MD [facility] None    No orders of the defined types were placed in this encounter.      -------------------------------

## 2020-12-26 ENCOUNTER — Telehealth: Payer: Self-pay | Admitting: Psychiatry

## 2020-12-26 NOTE — Telephone Encounter (Signed)
Put her on the cancellation list.  The detailed information is helpful.  I will not be able to determine what changes to make without seeing her.

## 2020-12-26 NOTE — Telephone Encounter (Signed)
Patient notified. She also mentions having a cough. I told her that was not something we could really do anything about and suggested she call her PCP. She said she was considering going to an urgent care. I told her if sx worsen to call back.

## 2020-12-26 NOTE — Telephone Encounter (Signed)
See phone message. Called patient to try to get an idea of what was going on. She states for the last week or so she has been feeling blue, achy, and has no energy. On questioning she states she doesn't usually have SAD. There have been no recent changes in medications and she denied any stressors. As I asked more questions she said she had been going to a chiropractor for about this same time period and having adjustments and doing some recommended exercises at home for neuropathy. She also said she was taking 2 powdered drink supplements that the chiropractor had recommended that she said were natural products - fruits/greens. She wants to move her appt with you up and info was given to scheduling for that. I also recommended she call her chiropractor and let them know of her complaints.

## 2020-12-26 NOTE — Telephone Encounter (Signed)
Pt LVM stating that she feels like she needs to see Dr Jennelle Human before her March appt or to see if there is some change that can be made to her medication.

## 2020-12-27 ENCOUNTER — Telehealth: Payer: Self-pay | Admitting: Psychiatry

## 2020-12-27 ENCOUNTER — Other Ambulatory Visit: Payer: Self-pay

## 2020-12-27 DIAGNOSIS — G2581 Restless legs syndrome: Secondary | ICD-10-CM

## 2020-12-27 MED ORDER — ALPRAZOLAM 0.25 MG PO TABS: ORAL_TABLET | ORAL | 3 refills | Status: DC

## 2020-12-27 NOTE — Telephone Encounter (Signed)
Pended.

## 2020-12-27 NOTE — Telephone Encounter (Signed)
Pt requesting Rx for Alprazolam 0.25 mg 1-2 tablets as needed for anxiety. [facility] [address]

## 2020-12-31 ENCOUNTER — Telehealth: Payer: Self-pay | Admitting: Psychiatry

## 2020-12-31 NOTE — Telephone Encounter (Signed)
Spoke with patient regarding her medication. States that insurance switched her medication for Concerta to the generic brand. She is unsure of what she should do and would like a rtc to discuss. [phone]

## 2021-01-01 ENCOUNTER — Other Ambulatory Visit: Payer: Self-pay

## 2021-01-01 DIAGNOSIS — F339 Major depressive disorder, recurrent, unspecified: Secondary | ICD-10-CM

## 2021-01-01 MED ORDER — METHYLPHENIDATE HCL ER (OSM) 36 MG PO TBCR: 72.0000 mg | EXTENDED_RELEASE_TABLET | ORAL | 0 refills | Status: DC

## 2021-01-01 NOTE — Telephone Encounter (Signed)
Pt received a letter from insurance stating affective January 1st they will be switching her to the generic and she will need a PA.She stated she would rather stay on the brand and is aware there is a cost difference.She needs a refill as well and I will pend.

## 2021-01-01 NOTE — Telephone Encounter (Signed)
Most people who take Concerta take a generic because the cost of the brand is so much higher.  Sometimes the pharmacy will switch from 1 generic manufacturer to another.  Some people will notice this difference either in effectiveness differences or side effect differences.  If it is a problem let us know and give [place] the name of the manufacturer that she prefers and document requested that.  If she wants only brand I can write for it but it might be cost prohibitive.  I would suggest trying the generic and seeing how it works she may be satisfied.

## 2021-01-01 NOTE — Telephone Encounter (Signed)
Is the generic ok?Or is there a difference between brand and generic

## 2021-02-26 ENCOUNTER — Telehealth: Payer: Self-pay | Admitting: Psychiatry

## 2021-02-26 ENCOUNTER — Other Ambulatory Visit: Payer: Self-pay | Admitting: Psychiatry

## 2021-02-26 DIAGNOSIS — F332 Major depressive disorder, recurrent severe without psychotic features: Secondary | ICD-10-CM

## 2021-02-26 DIAGNOSIS — G2581 Restless legs syndrome: Secondary | ICD-10-CM

## 2021-02-26 NOTE — Telephone Encounter (Signed)
Please call.

## 2021-02-26 NOTE — Telephone Encounter (Signed)
EM Patient's spouse lm stating Rachael Callahan is in a bad way.He feels she is in need of hospitalization. He is requesting a recommendation to where to place her.  Homero Fellers is requesting a call back as soon as possible.# [phone]      RTC  [phone] Dorita Fray She's been in bed for a couple of days and doesn't feel life is worth living.  Not directly voiced SI.  Only like this for a couple of days.  She doesn't desire to go anywhere.  He knows of no trigger except ongoing family issues.  Disc with pt Felt bad for a couple of mos after virus in December.  Not Covid.  Cough ongoing.  Don't feel like doing anything.  No energy.  No interest.  Everything seems like too much to deal with.  No med changes. No SI but feels useless.  Seemed OK prior to virus. Change in Concerta recently about a week or 2 ago.  Plan: Would prefer something that could have a fairly quick effect and not have to change the Cymbalta given her history of doing well on it until recently. She did not respond to Abilify or Rexulti but she might respond to Vraylar 1.5 mg every morning.  It is quick-acting and compatible with her duloxetine and Concerta.  Disc discussed side effects.  She is not suicidal and does not need hospitalization at this time. Her husband will come pick up samples of Vraylar 1.5 mg 1 every morning starting tomorrow.  Meredith Staggers, MD, DFAPA

## 2021-02-26 NOTE — Telephone Encounter (Signed)
Patient's spouse lm stating Rachael Callahan is in a bad way.He feels she is in need of hospitalization. He is requesting a recommendation to where to place her.  Rachael Callahan is requesting a call back as soon as possible.# [phone]

## 2021-02-26 NOTE — Telephone Encounter (Signed)
Gave information to Dr. Jennelle Human and he will contact husband

## 2021-02-27 NOTE — Telephone Encounter (Signed)
Reviewed

## 2021-03-01 ENCOUNTER — Telehealth: Payer: Self-pay | Admitting: Psychiatry

## 2021-03-01 NOTE — Telephone Encounter (Signed)
According to our records she should have a refill available. She last picked up on 1/11, but gets 15-day supply. I called the pharmacy and they said they had a RF ready for pickup. I called patient back and let her know. Patient said she was in [place] and would pass by the pharmacy on the way home and would check. I asked her how she was doing, as I knew last week she was struggling. She said she was much better "than I was 2 days ago." She laughed frequently during our short conversation.

## 2021-03-01 NOTE — Telephone Encounter (Signed)
Spoke with [employer]. She is requesting a RF on Xanax 0.25mg  1-2 tab daily. Needs to go:   [facility] [address], [place], [place]

## 2021-03-01 NOTE — Telephone Encounter (Signed)
Just an FYI

## 2021-03-01 NOTE — Telephone Encounter (Signed)
Noted! Thank you

## 2021-03-11 ENCOUNTER — Telehealth: Payer: Self-pay | Admitting: Psychiatry

## 2021-03-11 ENCOUNTER — Other Ambulatory Visit: Payer: Self-pay | Admitting: Psychiatry

## 2021-03-11 DIAGNOSIS — F339 Major depressive disorder, recurrent, unspecified: Secondary | ICD-10-CM

## 2021-03-11 MED ORDER — METHYLPHENIDATE HCL ER (OSM) 72 MG PO TBCR: 72.0000 mg | EXTENDED_RELEASE_TABLET | Freq: Every day | ORAL | 0 refills | Status: DC

## 2021-03-11 NOTE — Telephone Encounter (Signed)
Called patient to see what medication she was talking about and it is Concerta.  Pharmacy said provider could order a higher dose of medication possibly as a way around insurance issue. Will need a PA otherwise.

## 2021-03-11 NOTE — Telephone Encounter (Signed)
Next visit is 04/09/21. Calleigh called and said that her insurance only pays for one pill and day instead of two. [facility]  [id number] [place], [place] - [address],   Phone:  [phone]  Fax:  [phone]

## 2021-03-11 NOTE — Telephone Encounter (Signed)
Okay as requested I wrote for Concerta 72 mg tablets 1 daily.  Historically most insurances do not pay for that as well as they do 2 of the 36 mg tablets because it is more expensive.  It will require a PA, as all stimulants do.  We will see how it goes.  If this does not work she may have to drop the dose back to 54 mg a day

## 2021-03-12 ENCOUNTER — Other Ambulatory Visit: Payer: Self-pay

## 2021-03-12 DIAGNOSIS — F339 Major depressive disorder, recurrent, unspecified: Secondary | ICD-10-CM

## 2021-03-12 MED ORDER — METHYLPHENIDATE HCL ER (OSM) 36 MG PO TBCR: 72.0000 mg | EXTENDED_RELEASE_TABLET | Freq: Every day | ORAL | 0 refills | Status: DC

## 2021-03-12 NOTE — Telephone Encounter (Signed)
Prior Authorization initiated and submitted for METHYLPHENIDATE ER 36 MG 2 EVERY AM #60 through [facility] Part D Approval received effective 01/27/2021-01/26/2022.   #60 for a 30 day supply.  Please let pt know.

## 2021-03-12 NOTE — Telephone Encounter (Signed)
Patient returned call and info provided.  

## 2021-03-12 NOTE — Telephone Encounter (Signed)
Called patient and let her know that either way, two 36-mg tablets or one 72-mg tablets that she will most likely need a PA. Rachael Callahan is going to call pharmacy to run it and we will proceed from there.

## 2021-03-12 NOTE — Telephone Encounter (Signed)
LVM for patient to [facility]. Pended Rx for medication.

## 2021-03-14 ENCOUNTER — Telehealth: Payer: Self-pay | Admitting: Psychiatry

## 2021-03-14 NOTE — Telephone Encounter (Signed)
Pt lvm stating that the pharmacy doesn't have her concerta script. Please send in the script and please call her and let her know at [phone]

## 2021-03-14 NOTE — Telephone Encounter (Signed)
New PA was done. I called pharmacy and they said it was all good and were going to go ahead and fill the Rx. Patient notified.

## 2021-04-09 ENCOUNTER — Ambulatory Visit: Payer: Medicare Other | Admitting: Psychiatry

## 2021-04-10 ENCOUNTER — Telehealth: Payer: Self-pay | Admitting: Psychiatry

## 2021-04-10 NOTE — Telephone Encounter (Signed)
Last filled 2/16, due 3/16.  ?

## 2021-04-10 NOTE — Telephone Encounter (Signed)
Spoke with pt. She requests RF of Methylphenidate 36mg  2 tab per day to go to [facility]: ?[id number] [place] [place] - [address]  ?[address], [place] ?APPT 4/4 ?

## 2021-04-11 ENCOUNTER — Other Ambulatory Visit: Payer: Self-pay

## 2021-04-11 MED ORDER — METHYLPHENIDATE HCL ER (OSM) 36 MG PO TBCR: 72.0000 mg | EXTENDED_RELEASE_TABLET | Freq: Every day | ORAL | 0 refills | Status: DC

## 2021-04-11 NOTE — Telephone Encounter (Signed)
Pended.

## 2021-04-30 ENCOUNTER — Ambulatory Visit (INDEPENDENT_AMBULATORY_CARE_PROVIDER_SITE_OTHER): Payer: Medicare HMO | Admitting: Psychiatry

## 2021-04-30 ENCOUNTER — Encounter: Payer: Self-pay | Admitting: Psychiatry

## 2021-04-30 DIAGNOSIS — F900 Attention-deficit hyperactivity disorder, predominantly inattentive type: Secondary | ICD-10-CM | POA: Diagnosis not present

## 2021-04-30 DIAGNOSIS — F339 Major depressive disorder, recurrent, unspecified: Secondary | ICD-10-CM

## 2021-04-30 DIAGNOSIS — G2581 Restless legs syndrome: Secondary | ICD-10-CM

## 2021-04-30 DIAGNOSIS — F332 Major depressive disorder, recurrent severe without psychotic features: Secondary | ICD-10-CM

## 2021-04-30 MED ORDER — METHYLPHENIDATE HCL ER (OSM) 36 MG PO TBCR: 72.0000 mg | EXTENDED_RELEASE_TABLET | Freq: Every day | ORAL | 0 refills | Status: DC

## 2021-04-30 MED ORDER — DULOXETINE HCL 60 MG PO CPEP: 120.0000 mg | ORAL_CAPSULE | Freq: Every day | ORAL | 1 refills | Status: DC

## 2021-04-30 MED ORDER — PRAMIPEXOLE DIHYDROCHLORIDE 1 MG PO TABS: 1.0000 mg | ORAL_TABLET | Freq: Every day | ORAL | 1 refills | Status: DC

## 2021-04-30 MED ORDER — ALPRAZOLAM 0.25 MG PO TABS: ORAL_TABLET | ORAL | 3 refills | Status: DC

## 2021-04-30 MED ORDER — METHYLPHENIDATE HCL ER (OSM) 36 MG PO TBCR: 72.0000 mg | EXTENDED_RELEASE_TABLET | ORAL | 0 refills | Status: DC

## 2021-04-30 NOTE — Progress Notes (Signed)
Rachael Callahan ?[id number] ?Feb 11, 1945 ?76 y.o. ? ?Subjective:  ? ?Patient ID:  Rachael Callahan is a 76 y.o. (DOB Feb 11, 1945) female. ? ?Chief Complaint:  ?Chief Complaint  ?Patient presents with  ? Follow-up  ? Depression  ? ADD  ? ? ?Depression ?       Associated symptoms include no decreased concentration and no suicidal ideas. ?Rachael Callahan presents to the office today for follow-up of TRD. ? ?Prior addition of Concerta had helped with depression and productivity and interest and alertness. ? ?visit August 2020.  She was doing generally well and no meds were changed. ? ?03/2019 appt with the following noted: ?BP up here but reports it has been normal at home.   ?No new problems.  Still good On Concerta to 54 mg daily and duloxetine 120 mg since Jan 2020.  Has been doing well.  Overall the combination of meds is effective.  More productive and more interested and no longer depressed.  No longer napping.   ?Busy summer.  D in law brought 2 gkids from [place] and that was great.  GS 8 years older than adopted GD and they do well together. ?Thinks TMS helped some also. ?RLS managed usually with daily pramipexole. ?Xanax use a couple times per month. Often over concerns with grand-daughter's mother who causes problems. ?Minimal anxiety .  Sleep good and less napping.  Patient reports stable mood and denies depressed or irritable moods.  Patient denies any recent difficulty with anxiety.  Patient denies difficulty with sleep initiation or maintenance. Night owl and gets 7-8 hours.  No napping.   Denies appetite disturbance.  Patient reports that energy and motivation have been good.  Patient denies any difficulty with concentration.  Patient denies any suicidal ideation. ?Concerned about GD who's not doing well with psych treatment.  Had seen Rosey Batheresa before but transferred.  Bipolar disorder dx.  [facility].  Also had hosp. ? Plan:  No meds changed ? ?10/11/19 appt with the following noted: ?Covid free.   Vaccinated. ?Pretty good mood. Satisfied with meds otherwise. ?Last week problems with RLS after being stable for awhile.  No more caffeine than usual. ?No SE. ?Patient reports stable mood and denies depressed or irritable moods.  Patient denies any recent difficulty with anxiety.  Patient denies difficulty with sleep initiation or maintenance. Denies appetite disturbance.  Patient reports that energy and motivation have been good.  Patient denies any difficulty with concentration.  Patient denies any suicidal ideation. ?Uses Xanax occ. ?[name] well. ?GD [id number] and needs for insurance [facility]. ?Plan: no changes ? ?04/11/2020 appointment with the following noted: ?Since here concerta increased to 72 mg AM.  I think it's what I needed.  More motivated and energetic and productive but not normal. ?Still on duloxetine 120 without SE.   ?Mood good.  Patient reports stable mood and denies depressed or irritable moods.  Patient denies any recent difficulty with anxiety.  Patient denies difficulty with sleep initiation or maintenance, but night owl so sleep is messed up with retirement. Denies appetite disturbance.  Patient reports that energy and motivation have been good.  Patient denies any difficulty with concentration.  Patient denies any suicidal ideation. ?RLS managed.   ?Plan :  No med changes and she agrees. ? ?10/11/2020 appointment with the following noted: ?Xanax 3-4 times per month. ?RLS managed with pramipexole and muscle relaxing cream. ?No sig depression.  Satisfied with meds.  Occ situational mood problems. ?  Patient reports stable mood and denies depressed or irritable moods.  Patient denies any recent difficulty with anxiety.  Patient denies difficulty with sleep initiation or maintenance. Denies appetite disturbance.  Patient reports that energy and motivation have been good.  Patient denies any difficulty with concentration.  Patient denies any suicidal ideation. ?Stress GD married last  year to cocaine addict. ?No SE ?Initial insomnia at night but can sleep better in the day. ?1 cup AM coffee. ?Plan: No med changes ? ?02/26/2021 phone call from husband and also discussed with patient.  MD note as follows: ?RTC  ?[phone] Dorita Fray ?She's been in bed for a couple of days and doesn't feel life is worth living.  Not directly voiced SI.  Only like this for a couple of days.  She doesn't desire to go anywhere.  He knows of no trigger except ongoing family issues. ? Disc with pt ?Felt bad for a couple of mos after virus in December.  Not Covid.  Cough ongoing.  Don't feel like doing anything.  No energy.  No interest.  Everything seems like too much to deal with.  No med changes. ?No SI but feels useless.  Seemed OK prior to virus. ?Change in Concerta recently about a week or 2 ago. ? Plan: Would prefer something that could have a fairly quick effect and not have to change the Cymbalta given her history of doing well on it until recently. ?She did not respond to Abilify or Rexulti but she might respond to Vraylar 1.5 mg every morning.  It is quick-acting and compatible with her duloxetine and Concerta.  Disc discussed side effects.  She is not suicidal and does not need hospitalization at this time. ?Her husband will come pick up samples of Vraylar 1.5 mg 1 every morning starting tomorrow. ? Meredith Staggers, MD, DFAPA ? ? ?04/30/2021 appointment with the following noted: ?Several phone calls since she was here. ?Took Vryalar for a week and saw benefit witout SE and then stopped it. ?Felt fine for awhile.  Then had hard time last weekend and took another Vraylar bc felt weepy. ?Today feels fine with mood..  Generally low anxiety. ?Rare alprazolam. ?Taking generic Concerta without a problem. ?No SE. ?Not much dep in last 6 mos except as noted. ?GD pregnant with first [name].  Concerned about GD physical's and mental health. GD married.   ?GD sober. ? ? ?Psychiatric medications tried include fluoxetine  bupropion, lithium, Trintellix, venlafaxine, duloxetine 120 + Concerta 54, ?pramipexole 1,  ?Latuda 40 mg, Rexulti 2 mg with no benefit,  ?TMS,   ? ?Review of Systems:  ?Review of Systems  ?Cardiovascular:  Negative for chest pain.  ?Musculoskeletal:  Positive for arthralgias.  ?Neurological:  Negative for dizziness and tremors.  ?     RLS is temporarily worse.  ?Psychiatric/Behavioral:  Negative for agitation, behavioral problems, confusion, decreased concentration, dysphoric mood, hallucinations, self-injury, sleep disturbance and suicidal ideas. The patient is not nervous/anxious and is not hyperactive.   ? ?Medications: I have reviewed the patient's current medications. ? ?Current Outpatient Medications  ?Medication Sig Dispense Refill  ? ALPRAZolam (XANAX) 0.25 MG tablet TAKE 1-2 TABLETS EVERYDAY AS NEEDED ANXIETY 30 tablet 3  ? Cholecalciferol 25 MCG (1000 UT) capsule Take by mouth.    ? losartan (COZAAR) 100 MG tablet Take 100 mg by mouth daily.  1  ? [START ON 06/06/2021] methylphenidate 36 MG PO CR tablet Take 2 tablets (72 mg total) by mouth daily. 60 tablet 0  ?  metoprolol succinate (TOPROL-XL) 50 MG 24 hr tablet Take 50 mg by mouth daily.    ? Multiple Vitamins-Minerals (MULTIVITAMIN ADULT PO) Take by mouth.    ? omeprazole (PRILOSEC) 40 MG capsule Take 40 mg by mouth daily.  1  ? pravastatin (PRAVACHOL) 10 MG tablet Take 10 mg by mouth daily.  3  ? Probiotic Product (PROBIOTIC-10 PO) Take by mouth.    ? DULoxetine (CYMBALTA) 60 MG capsule Take 2 capsules (120 mg total) by mouth daily. 180 capsule 1  ? [START ON 06/25/2021] methylphenidate 36 MG PO CR tablet Take 2 tablets (72 mg total) by mouth every morning. 60 tablet 0  ? [START ON 05/28/2021] methylphenidate 36 MG PO CR tablet Take 2 tablets (72 mg total) by mouth daily. 60 tablet 0  ? [START ON 05/09/2021] methylphenidate 36 MG PO CR tablet Take 2 tablets (72 mg total) by mouth daily. 60 tablet 0  ? pramipexole (MIRAPEX) 1 MG tablet Take 1 tablet (1 mg  total) by mouth at bedtime. 90 tablet 1  ? ?No current facility-administered medications for this visit.  ? ? ?Medication Side Effects: None ? ?Allergies: No Known Allergies ? ?Past Medical History:  ?Diagnosis Date

## 2021-05-21 ENCOUNTER — Telehealth: Payer: Self-pay | Admitting: Psychiatry

## 2021-05-21 MED ORDER — CARIPRAZINE HCL 1.5 MG PO CAPS: 1.5000 mg | ORAL_CAPSULE | Freq: Every day | ORAL | 1 refills | Status: DC

## 2021-05-21 NOTE — Telephone Encounter (Signed)
Yes please send a prescription for Vraylar 1.5 mg capsules 1 daily #31 refill.  Because she has [employer] she will not be able to use a coupon.  Hopefully it will be affordable. ?

## 2021-05-21 NOTE — Telephone Encounter (Signed)
Pt called at 9:37 am and said that she has been taking samples of vraylar 1.5 mg and it is working and she feels better. She would like to know if dr. Jennelle Human wants her to continue with this med she will need a script sent in. She has 5 days left of the samples. She is understanding that it is an expensive drug and may need a PA and maybe a coupon card. Please call her and let her know what dr. Jennelle Human wants her to do at [phone]. ?

## 2021-05-21 NOTE — Telephone Encounter (Signed)
See message. Do you want patient to continue? I will send script if so.  ?

## 2021-05-21 NOTE — Telephone Encounter (Signed)
Rx sent to the requested pharmacy. Informed her that due to insurance she was not eligible to use a co-pay card. She said she had 5 pills left. Told her samples are available if needed until PA approved.  ?

## 2021-05-28 ENCOUNTER — Telehealth: Payer: Self-pay | Admitting: Psychiatry

## 2021-05-28 NOTE — Telephone Encounter (Signed)
Pt may be eligible for assistance for Vraylar thru the  my [employer] program even with Medicare. Forms were mailed to her today. She will mail them back and we can submit her application for her. ?

## 2021-06-28 ENCOUNTER — Other Ambulatory Visit: Payer: Self-pay | Admitting: Psychiatry

## 2021-06-28 ENCOUNTER — Other Ambulatory Visit: Payer: Self-pay

## 2021-06-28 ENCOUNTER — Telehealth: Payer: Self-pay | Admitting: Psychiatry

## 2021-06-28 MED ORDER — METHYLPHENIDATE HCL 20 MG PO TABS: 20.0000 mg | ORAL_TABLET | Freq: Two times a day (BID) | ORAL | 0 refills | Status: DC

## 2021-06-28 NOTE — Telephone Encounter (Addendum)
Patient came into the office today to say she could not find her generic Concerta in stock. She takes 36 mg. I have checked availability with [facility]S and [facility] today within a 25 mile radius and no one had in stock. Last fill of Concerta was [id number]. Tressia Miners has tried to do PAs for brand but they are being denied, they have had to have a reaction to generic or tried various other stimulants first.  We are having to cancel less of the various Adderall formulations, though it can still be a challenge to find.   She was also asking for a RF of alprazolam. I showed she should have 3 refills available. I called pharmacy and verified she did and had them refill one.

## 2021-06-28 NOTE — Telephone Encounter (Signed)
Switched to Ritalin 20 mg twice daily.  She has a Medicare plan which will almost certainly not cover Vyvanse.  So the short acting is probably the only available option.

## 2021-07-01 NOTE — Telephone Encounter (Signed)
Patient notified Rx was sent. Checked Cover My Meds and there is no PA request. Told patient to check with pharmacy to see if they had in stock and if a PA was required.

## 2021-07-09 ENCOUNTER — Telehealth: Payer: Self-pay | Admitting: Psychiatry

## 2021-07-09 NOTE — Telephone Encounter (Signed)
Pt stated she has been sleeping more and has very little motivation.She is not sure if meds are helping.She stated no suicidal thoughts.She has seen commercials on rexulti and wanted to know your thoughts

## 2021-07-09 NOTE — Telephone Encounter (Signed)
Rexulti is unlikely to help if Vraylar does not help. Is she still taking Vraylar?   There are other options but will need to be pursued at her next appt.

## 2021-07-09 NOTE — Telephone Encounter (Signed)
Pt reports that she is having difficulty with depression. Dealing with son who is ill living with her. She is not sure yet if the Ritalin is helping. Is there anything else she can add to help with depression. Pt asks about Rexulti?

## 2021-07-10 NOTE — Telephone Encounter (Signed)
Yes she is still taking vraylar.She will call back if things get worse

## 2021-07-11 ENCOUNTER — Telehealth: Payer: Self-pay | Admitting: Psychiatry

## 2021-07-11 NOTE — Telephone Encounter (Signed)
Last filled 6/2, but was not her usual dosage due to shortage.

## 2021-07-11 NOTE — Telephone Encounter (Signed)
Rachael Callahan lvm at 4:51 stating the Concerta dosage she is taking [facility]. She is also inquiring if she should discontinue the Vraylar. Please advise.

## 2021-07-12 NOTE — Telephone Encounter (Signed)
Called patient and mailbox is full. Called pharmacy to see if there was still a RF on file for her 36 mg x2 on file and was told she picked it up yesterday.

## 2021-07-12 NOTE — Telephone Encounter (Signed)
Patient was notified of recommendations

## 2021-07-12 NOTE — Telephone Encounter (Signed)
Was able to reach patient and she said she is so sleepy during the day. She is relating it to Vraylar, but she has been on it for a while. She is asking if she should stop the Vraylar. She said there had been a lot going on with her son and lots of family events and that maybe she was just stressed. She said the alprazolam was helpful for anxiety and sleep if she took at night. She did get a fill on her normal methylphenidate dose yesterday of 72 mg, but has not started that, as she still has 20 mg tabs left.

## 2021-07-12 NOTE — Telephone Encounter (Signed)
When she called [place] about 3 weeks after starting the Vraylar she had indicated it was helpful.  If she is having sleepiness from it now it means that the blood level has built up too high.  Have her stop the Vraylar for 4 days and then resume 1 every other day.  Once the sleepiness wears off she should be able to see the benefit that she initially noted.  I see her in the office in about 3 weeks

## 2021-07-12 NOTE — Telephone Encounter (Signed)
Pt LVM on 6/16 @ 6:01p.  She stated she had called earlier.  She said she wanted the Concerta called into [facility].  They have it in stock.  She doesn't feel like the Vraylar she has been taking is helping her.  Next appt 7/6

## 2021-08-01 ENCOUNTER — Ambulatory Visit (INDEPENDENT_AMBULATORY_CARE_PROVIDER_SITE_OTHER): Payer: Self-pay | Admitting: Psychiatry

## 2021-08-01 DIAGNOSIS — F339 Major depressive disorder, recurrent, unspecified: Secondary | ICD-10-CM

## 2021-08-01 NOTE — Progress Notes (Signed)
No show

## 2021-08-13 ENCOUNTER — Other Ambulatory Visit: Payer: Self-pay | Admitting: Psychiatry

## 2021-08-13 DIAGNOSIS — F339 Major depressive disorder, recurrent, unspecified: Secondary | ICD-10-CM

## 2021-08-13 NOTE — Telephone Encounter (Signed)
Pt requesting RF methylphenidate 36 mg PO CR tablet 2/d. [facility] [address] canc list

## 2021-08-15 MED ORDER — METHYLPHENIDATE HCL ER (OSM) 36 MG PO TBCR: 72.0000 mg | EXTENDED_RELEASE_TABLET | Freq: Every day | ORAL | 0 refills | Status: DC

## 2021-08-15 MED ORDER — METHYLPHENIDATE HCL ER (OSM) 36 MG PO TBCR
72.0000 mg | EXTENDED_RELEASE_TABLET | Freq: Every day | ORAL | 0 refills | Status: DC
Start: 2021-08-15 — End: 2022-03-06

## 2021-08-15 NOTE — Telephone Encounter (Signed)
Pended.

## 2021-09-12 ENCOUNTER — Other Ambulatory Visit: Payer: Self-pay | Admitting: Psychiatry

## 2021-09-12 DIAGNOSIS — G2581 Restless legs syndrome: Secondary | ICD-10-CM

## 2021-09-12 NOTE — Telephone Encounter (Signed)
Rachael Callahan requesting refills on the Alprazolam and the Ritalin.  Next appointment 11/04/21  Contact information # [phone]

## 2021-10-03 ENCOUNTER — Telehealth: Payer: Self-pay | Admitting: Psychiatry

## 2021-10-03 NOTE — Telephone Encounter (Signed)
Rachael Callahan lvm stating she is having difficulty sleeping, and a lot of depression. Patient is scheduled to be seen 11/21.She is requesting a return call to discuss these matters.  Contact # [phone]

## 2021-10-03 NOTE — Telephone Encounter (Signed)
LVM to RC 

## 2021-10-03 NOTE — Telephone Encounter (Signed)
Pt called back and returned your call. Please call her back

## 2021-10-04 ENCOUNTER — Other Ambulatory Visit: Payer: Self-pay

## 2021-10-04 MED ORDER — MIRTAZAPINE 30 MG PO TABS: 30.0000 mg | ORAL_TABLET | Freq: Every day | ORAL | 0 refills | Status: DC

## 2021-10-04 MED ORDER — MIRTAZAPINE 15 MG PO TABS: 15.0000 mg | ORAL_TABLET | Freq: Every day | ORAL | 0 refills | Status: DC

## 2021-10-04 NOTE — Telephone Encounter (Signed)
Please put patient on cancellation list.

## 2021-10-04 NOTE — Telephone Encounter (Addendum)
Patient called c/o increased depression and inability to sleep. She said she can't get to sleep and then only sleeps 2-3 hours at a time. She takes methylphenidate in the a.m., drinks a cup of coffee in the a.m. and no more caffeine during the day. She said since she is retired she struggles with a normal sleep routine.  She has taken melatonin gummies and Tylenol PM without benefit. She denied any new stressors, just dealing with aging and all the issues that come with it. She said that sometimes taking 2 alprazolam will help with sleep. Last filled alprazolam on 8/18 for #30 BID prn, said she is going to pick up a RF today.   Taking Vraylar 1.5 Duloxetine 120 mg total Methylphenidate 72 mg total Pramipexole 1 mg qhs Alprazolam 1-2 qd prn   Taking as prescribed.   I told her I would call her back once I had gotten a response from you. She expresses frustration not being able to talk to her doctor. I told her that depending on her insurance she could schedule a phone or MyChart visit.

## 2021-10-04 NOTE — Telephone Encounter (Signed)
Patient notified of recommendations. She had been taking 1.5 mg QOD. Rx for mirtazapine sent.  Patient said her husband was reading and came across "dysania". Her husband said this sounded like her. Patient later mentioned that she and husband are struggling with adjusting to this period in their life. There are also a lot of family members with different life circumstances that cause concern for them.

## 2021-10-04 NOTE — Telephone Encounter (Signed)
She missed her last appointment with me in July.  But since her last visit she had started Vraylar complained of feeling sleepy with it and I encouraged her to reduce it to every other day.  Please ask what dosage she is taking?  And how frequent is the more specific question is it every other day.  It appears that it is not helping her though it seemed to do so at first.  I wonder if it is contributing to her sleep problem but the bottom line is that does not appear to be helping so she should stop it. The antidepressant that works the best for both sleep and depression and is the easiest he used is mirtazapine.  Send a prescription for mirtazapine 30 mg tablets, 1 nightly, no refills.  It should help sleep right away.  If she has been taking alprazolam regularly for sleep however cut that dosage in half and wait 2 weeks and then stop it.  She should not stop alprazolam abruptly if she has been taking it regularly.  Please also put her on the cancellation list.

## 2021-10-15 NOTE — Telephone Encounter (Signed)
Also tell her to stop the Vraylar.  It does not appear to be helping at this time.

## 2021-10-16 NOTE — Telephone Encounter (Signed)
LVM to RC 

## 2021-10-18 NOTE — Telephone Encounter (Signed)
Called patient again. Told her to stop Vraylar and she said she had stopped it already. She said the mirtazapine is wonderful for helping her with sleep.

## 2021-10-24 ENCOUNTER — Other Ambulatory Visit: Payer: Self-pay | Admitting: Psychiatry

## 2021-10-24 DIAGNOSIS — F332 Major depressive disorder, recurrent severe without psychotic features: Secondary | ICD-10-CM

## 2021-10-24 NOTE — Telephone Encounter (Signed)
Will try to get her into office tomorrow

## 2021-10-30 ENCOUNTER — Telehealth: Payer: Self-pay

## 2021-10-30 ENCOUNTER — Telehealth: Payer: Self-pay | Admitting: Psychiatry

## 2021-10-30 NOTE — Telephone Encounter (Signed)
Patient lvm requesting a refill on the Ritalin CR 36 mg. A follow up appointment is scheduled for 11/21.  Contact information [phone]

## 2021-10-30 NOTE — Telephone Encounter (Signed)
Pt has rx at pharmacy 

## 2021-11-04 ENCOUNTER — Ambulatory Visit: Payer: Medicare HMO | Admitting: Psychiatry

## 2021-11-05 ENCOUNTER — Other Ambulatory Visit: Payer: Self-pay | Admitting: Psychiatry

## 2021-11-05 NOTE — Telephone Encounter (Signed)
Verified with pharmacy that she had a RF available and asked them to fill. Notified patient.

## 2021-11-05 NOTE — Telephone Encounter (Signed)
Pt called and said that she is out of her methylphenidate 36 mg. She has been out two days.[facility] [address] [facility] [place]

## 2021-11-13 ENCOUNTER — Other Ambulatory Visit: Payer: Self-pay | Admitting: Psychiatry

## 2021-11-13 ENCOUNTER — Telehealth: Payer: Self-pay

## 2021-11-13 DIAGNOSIS — F332 Major depressive disorder, recurrent severe without psychotic features: Secondary | ICD-10-CM

## 2021-11-13 MED ORDER — DULOXETINE HCL 30 MG PO CPEP: 90.0000 mg | ORAL_CAPSULE | Freq: Every day | ORAL | 1 refills | Status: DC

## 2021-11-13 MED ORDER — LITHIUM CARBONATE ER 300 MG PO TBCR: 300.0000 mg | EXTENDED_RELEASE_TABLET | Freq: Every evening | ORAL | 0 refills | Status: DC

## 2021-11-13 NOTE — Telephone Encounter (Signed)
She missed an appointment in July and I have not seen her in 6 months.  She needs an antidepressant change but I do not want to do that through just a phone call.  We will need to change from Cymbalta to another antidepressant and in preparation for that we need to decrease the Cymbalta from 120 mg to 90 mg.  I will send in a prescription for 30 mg capsules and she should reduce to 90 mg daily. In the meantime we can try low-dose lithium which will sometimes make an antidepressant work.  In this dosage which is low she should need blood levels nor have any significant side effects.  I will send in the prescription for both of these 2.

## 2021-11-14 ENCOUNTER — Other Ambulatory Visit: Payer: Self-pay | Admitting: Psychiatry

## 2021-11-14 NOTE — Telephone Encounter (Signed)
Patient notified of recommendations and that scripts had been sent.

## 2021-11-15 ENCOUNTER — Telehealth: Payer: Self-pay | Admitting: Psychiatry

## 2021-11-15 ENCOUNTER — Other Ambulatory Visit: Payer: Self-pay

## 2021-11-15 DIAGNOSIS — G2581 Restless legs syndrome: Secondary | ICD-10-CM

## 2021-11-15 MED ORDER — ALPRAZOLAM 0.25 MG PO TABS: ORAL_TABLET | ORAL | 1 refills | Status: DC

## 2021-11-15 NOTE — Telephone Encounter (Signed)
Pended.

## 2021-11-15 NOTE — Telephone Encounter (Signed)
Patient lvm at 11:38 requesting refill for Alprazolam 0.25mg . Ph: [phone] Appt 11/21 Pharmacy [facility]

## 2021-11-20 ENCOUNTER — Telehealth: Payer: Self-pay | Admitting: Psychiatry

## 2021-11-20 NOTE — Telephone Encounter (Signed)
Pt Lvm @ 2p.  She said she started on a med about a week ago from Dr Clovis Pu.  She was thinking she would be feeling better by now.  Is there anything else she can do?  Next appt 11/21

## 2021-11-21 NOTE — Telephone Encounter (Signed)
Spoke to pt.She was referring to the lithium.She said she is doing much better today and thinks she needed to give med time to work

## 2021-12-02 ENCOUNTER — Other Ambulatory Visit: Payer: Self-pay | Admitting: Psychiatry

## 2021-12-02 ENCOUNTER — Telehealth: Payer: Self-pay | Admitting: Psychiatry

## 2021-12-02 NOTE — Telephone Encounter (Signed)
I am sorry but I cannot make any more changes over the phone.  This is not working.  I need to to see her to make any more medicine changes.  She has an appointment in a couple of weeks.

## 2021-12-02 NOTE — Telephone Encounter (Signed)
Pt has been on lithium 300 mg for a week and Cymbalta was decreased,The changes did not help.She is very depressed and can't function.She finds it hard to get out and get things done and is crying easily.Please advise

## 2021-12-02 NOTE — Telephone Encounter (Signed)
Pt LVM @ 9:02a.  She was crying towards the end of the message.  She said she is feeling down.  She said Dr. Clovis Pu added Lithium , but she isn't sure what to do.  Next appt 11/21

## 2021-12-03 ENCOUNTER — Other Ambulatory Visit: Payer: Self-pay

## 2021-12-03 DIAGNOSIS — F339 Major depressive disorder, recurrent, unspecified: Secondary | ICD-10-CM

## 2021-12-03 MED ORDER — METHYLPHENIDATE HCL ER (OSM) 36 MG PO TBCR: 72.0000 mg | EXTENDED_RELEASE_TABLET | ORAL | 0 refills | Status: DC

## 2021-12-03 NOTE — Telephone Encounter (Signed)
Pt informed.Please add her to the cancellation list

## 2021-12-04 NOTE — Telephone Encounter (Signed)
Pt LVM @ 9:10a.  She wanted to know if the nurse could call her back about doing TMS Therapy.    Pt's appt has been moved up to 11/15.

## 2021-12-11 ENCOUNTER — Ambulatory Visit (INDEPENDENT_AMBULATORY_CARE_PROVIDER_SITE_OTHER): Payer: Medicare HMO | Admitting: Psychiatry

## 2021-12-11 ENCOUNTER — Encounter: Payer: Self-pay | Admitting: Psychiatry

## 2021-12-11 DIAGNOSIS — F900 Attention-deficit hyperactivity disorder, predominantly inattentive type: Secondary | ICD-10-CM

## 2021-12-11 DIAGNOSIS — F339 Major depressive disorder, recurrent, unspecified: Secondary | ICD-10-CM | POA: Diagnosis not present

## 2021-12-11 DIAGNOSIS — G2581 Restless legs syndrome: Secondary | ICD-10-CM

## 2021-12-11 NOTE — Patient Instructions (Addendum)
Stop mirtazapine and lithium Reduce duloxetine to 60 mg daily for 1 week then 30 mg daily for 1 week then stop duloxetine Start Auvelity 1 in the AM for 1 week then 1 twice daily as long as dizziness is not a problem.

## 2021-12-11 NOTE — Progress Notes (Signed)
Rachael Callahan [id number] 11-21-1945 76 y.o.  Subjective:   Patient ID:  Rachael Callahan is a 76 y.o. (DOB [birth year]) female.  Chief Complaint:  Chief Complaint  Patient presents with   Follow-up   Depression   ADHD    Depression        Associated symptoms include no decreased concentration and no suicidal ideas.  Rachael Callahan presents to the office today for follow-up of TRD.  Prior addition of Concerta had helped with depression and productivity and interest and alertness.  visit August 2020.  She was doing generally well and no meds were changed.  03/2019 appt with the following noted: BP up here but reports it has been normal at home.   No new problems.  Still good On Concerta to 54 mg daily and duloxetine 120 mg since Jan 2020.  Has been doing well.  Overall the combination of meds is effective.  More productive and more interested and no longer depressed.  No longer napping.   Busy summer.  D in law brought 2 gkids from [place] and that was great.  GS 8 years older than adopted GD and they do well together. Thinks TMS helped some also. RLS managed usually with daily pramipexole. Xanax use a couple times per month. Often over concerns with grand-daughter's mother who causes problems. Minimal anxiety .  Sleep good and less napping.  Patient reports stable mood and denies depressed or irritable moods.  Patient denies any recent difficulty with anxiety.  Patient denies difficulty with sleep initiation or maintenance. Night owl and gets 7-8 hours.  No napping.   Denies appetite disturbance.  Patient reports that energy and motivation have been good.  Patient denies any difficulty with concentration.  Patient denies any suicidal ideation. Concerned about GD who's not doing well with psych treatment.  Had seen [name] before but transferred.  GD Bipolar disorder dx.  [facility] at [facility].  Also had hosp.  Plan:  No meds changed  10/11/19 appt with the following noted: Covid free.   Vaccinated. Pretty good mood. Satisfied with meds otherwise. Last week problems with RLS after being stable for awhile.  No more caffeine than usual. No SE. Patient reports stable mood and denies depressed or irritable moods.  Patient denies any recent difficulty with anxiety.  Patient denies difficulty with sleep initiation or maintenance. Denies appetite disturbance.  Patient reports that energy and motivation have been good.  Patient denies any difficulty with concentration.  Patient denies any suicidal ideation. Uses Xanax occ. [name] well. GD 26 and needs for insurance to see doc in [facility]. Plan: no changes  04/11/2020 appointment with the following noted: Since here concerta increased to 72 mg AM.  I think it's what I needed.  More motivated and energetic and productive but not normal. Still on duloxetine 120 without SE.   Mood good.  Patient reports stable mood and denies depressed or irritable moods.  Patient denies any recent difficulty with anxiety.  Patient denies difficulty with sleep initiation or maintenance, but night owl so sleep is messed up with retirement. Denies appetite disturbance.  Patient reports that energy and motivation have been good.  Patient denies any difficulty with concentration.  Patient denies any suicidal ideation. RLS managed.   Plan :  No med changes and she agrees.  10/11/2020 appointment with the following noted: Xanax 3-4 times per month. RLS managed with pramipexole and muscle relaxing cream. No sig depression.  Satisfied with meds.  Occ situational  mood problems. Patient reports stable mood and denies depressed or irritable moods.  Patient denies any recent difficulty with anxiety.  Patient denies difficulty with sleep initiation or maintenance. Denies appetite disturbance.  Patient reports that energy and motivation have been good.  Patient denies any difficulty with concentration.  Patient denies any suicidal ideation. Stress GD married last  year to cocaine addict. No SE Initial insomnia at night but can sleep better in the day. 1 cup AM coffee. Plan: No med changes  02/26/2021 phone call from husband and also discussed with patient.  MD note as follows: RTC  [phone] Rachael Callahan She's been in bed for a couple of days and doesn't feel life is worth living.  Not directly voiced SI.  Only like this for a couple of days.  She doesn't desire to go anywhere.  He knows of no trigger except ongoing family issues.  Disc with pt Felt bad for a couple of mos after virus in December.  Not Covid.  Cough ongoing.  Don't feel like doing anything.  No energy.  No interest.  Everything seems like too much to deal with.  No med changes. No SI but feels useless.  Seemed OK prior to virus. Change in Concerta recently about a week or 2 ago.  Plan: Would prefer something that could have a fairly quick effect and not have to change the Cymbalta given her history of doing well on it until recently. She did not respond to Abilify or Rexulti but she might respond to Vraylar 1.5 mg every morning.  It is quick-acting and compatible with her duloxetine and Concerta.  Disc discussed side effects.  She is not suicidal and does not need hospitalization at this time. Her husband will come pick up samples of Vraylar 1.5 mg 1 every morning starting tomorrow.  Rachael Staggers, MD, [facility]  04/30/2021 appointment with the following noted: Several phone calls since she was here. Took Vryalar for a week and saw benefit witout SE and then stopped it. Felt fine for awhile.  Then had hard time last weekend and took another Vraylar bc felt weepy. Today feels fine with mood..  Generally low anxiety. Rare alprazolam. Taking generic Concerta without a problem. No SE. Not much dep in last 6 mos except as noted. GD pregnant with first [name].  Concerned about GD physical's and mental health. GD married.   GD sober. Plan: continue Concerta 72, duloxetine 120  05/21/21 took  Vraylar again for depression and felt better and rX sent.  Cost was a problem so PT assistance forms mailed to her.  07/12/21 TC complaining of sleepiness and relating it to Vraylar.  Dose reduced to QOD  08/01/21 No show  10/03/21 TC complaining of depression. Sent Rx mirtazapine 30 mg HS.  Still taking Vraylar 1.5 QOD.  Was told to stop it since it was apparently no lonnger helping  12/10/21 appt noted:  seen with Rachael Callahan A lot of phone calls since here with depressive sx. Current psych meds: duloxetine 90, lithium 300, Ritalin 72 AM, mirtazapine 30. Depression worse in the last couple of week. SE lithium tremor and jittery.  Therefore needing alprazolam more often. Last free of dep about 6 weeks ago. Sleeping too much.  Sleep to escape depression.   Asks about Zoloft.    Psychiatric medications tried include fluoxetine bupropion, lithium, Trintellix, venlafaxine, duloxetine 120 + Concerta 54, Mirtazapine 30 without help for depression pramipexole 1,  Latuda 40 mg, Rexulti 2 mg with no benefit,  Vraylar 1.5 had benefit and then lost it.  TMS helped,    Review of Systems:  Review of Systems  Cardiovascular:  Negative for chest pain.  Musculoskeletal:  Positive for arthralgias.  Neurological:  Negative for dizziness and tremors.       RLS is temporarily worse.  Psychiatric/Behavioral:  Negative for agitation, behavioral problems, confusion, decreased concentration, dysphoric mood, hallucinations, self-injury, sleep disturbance and suicidal ideas. The patient is not nervous/anxious and is not hyperactive.     Medications: I have reviewed the patient's current medications.  Current Outpatient Medications  Medication Sig Dispense Refill   ALPRAZolam (XANAX) 0.25 MG tablet TAKE 1-2 TABLETS EVERYDAY AS NEEDED ANXIETY 30 tablet 1   Cholecalciferol 25 MCG (1000 UT) capsule Take by mouth.     DULoxetine (CYMBALTA) 30 MG capsule One 30 mg capsule daily to Take with 1 of 60 mg capsules 90  capsule 0   DULoxetine (CYMBALTA) 60 MG capsule Take 60 mg by mouth daily.     lithium carbonate (LITHOBID) 300 MG CR tablet Take 1 tablet (300 mg total) by mouth at bedtime. 90 tablet 0   losartan (COZAAR) 100 MG tablet Take 100 mg by mouth daily.  1   methylphenidate 36 MG PO CR tablet Take 2 tablets (72 mg total) by mouth daily. 60 tablet 0   methylphenidate 36 MG PO CR tablet Take 2 tablets (72 mg total) by mouth daily. 60 tablet 0   methylphenidate 36 MG PO CR tablet Take 2 tablets (72 mg total) by mouth daily. 60 tablet 0   methylphenidate 36 MG PO CR tablet Take 2 tablets (72 mg total) by mouth daily. 60 tablet 0   methylphenidate 36 MG PO CR tablet Take 2 tablets (72 mg total) by mouth daily. 60 tablet 0   methylphenidate 36 MG PO CR tablet Take 2 tablets (72 mg total) by mouth every morning. 60 tablet 0   metoprolol succinate (TOPROL-XL) 50 MG 24 hr tablet Take 50 mg by mouth daily.     mirtazapine (REMERON) 30 MG tablet TAKE 1 TABLET BY MOUTH EVERYDAY AT BEDTIME 30 tablet 0   Multiple Vitamins-Minerals (MULTIVITAMIN ADULT PO) Take by mouth.     omeprazole (PRILOSEC) 40 MG capsule Take 40 mg by mouth daily.  1   pramipexole (MIRAPEX) 1 MG tablet Take 1 tablet (1 mg total) by mouth at bedtime. 90 tablet 1   pravastatin (PRAVACHOL) 10 MG tablet Take 10 mg by mouth daily.  3   Probiotic Product (PROBIOTIC-10 PO) Take by mouth.     No current facility-administered medications for this visit.    Medication Side Effects: None  Allergies: No Known Allergies  Past Medical History:  Diagnosis Date   Depression    High cholesterol    Hypertension    Migraine    Stomach ulcer     History reviewed. No pertinent family history.  Social History   Socioeconomic History   Marital status: Married    Spouse name: Not on file   Number of children: Not on file   Years of education: Not on file   Highest education level: Not on file  Occupational History   Not on file  Tobacco Use    Smoking status: Never   Smokeless tobacco: Never  Vaping Use   Vaping Use: Never used  Substance and Sexual Activity   Alcohol use: Yes    Comment: rarely   Drug use: Never   Sexual activity: Not on file  Other Topics Concern   Not on file  Social History Narrative   Not on file   Social Determinants of Health   Financial Resource Strain: Not on file  Food Insecurity: Not on file  Transportation Needs: Not on file  Physical Activity: Not on file  Stress: Not on file  Social Connections: Not on file  Intimate Partner Violence: Not on file    Past Medical History, Surgical history, Social history, and Family history were reviewed and updated as appropriate.   Please see review of systems for further details on the patient's review from today.   Objective:   Physical Exam:  There were no vitals taken for this visit.  Physical Exam Constitutional:      General: She is not in acute distress.    Appearance: She is well-developed.  Musculoskeletal:        General: No deformity.  Neurological:     Mental Status: She is alert and oriented to person, place, and time.     Motor: No tremor.     Coordination: Coordination normal.     Gait: Gait normal.  Psychiatric:        Attention and Perception: Perception normal. She is attentive.        Mood and Affect: Mood is not anxious or depressed. Affect is not labile, blunt, angry or inappropriate.        Speech: Speech normal.        Behavior: Behavior normal.        Thought Content: Thought content normal. Thought content is not delusional. Thought content does not include homicidal or suicidal ideation. Thought content does not include suicidal plan.        Cognition and Memory: Cognition normal.        Judgment: Judgment normal.     Comments: Insight intact. No auditory or visual hallucinations. No delusions.  Talkative without pressure     Lab Review:     Component Value Date/Time   NA 141 05/14/2017 0917   K 4.4  05/14/2017 0917   CL 104 05/14/2017 0917   CO2 28 05/14/2017 0917   GLUCOSE 112 (H) 05/14/2017 0917   BUN 16 05/14/2017 0917   CREATININE 0.69 05/14/2017 0917   CALCIUM 9.3 05/14/2017 0917   GFRNONAA >60 05/14/2017 0917   GFRAA >60 05/14/2017 0917       Component Value Date/Time   WBC 6.3 05/14/2017 0917   RBC 4.58 05/14/2017 0917   HGB 14.2 05/14/2017 0917   HCT 41.7 05/14/2017 0917   PLT 235 05/14/2017 0917   MCV 91.0 05/14/2017 0917   MCH 31.0 05/14/2017 0917   MCHC 34.1 05/14/2017 0917   RDW 13.1 05/14/2017 0917   LYMPHSABS 1.4 05/14/2017 0917   MONOABS 0.5 05/14/2017 0917   EOSABS 0.2 05/14/2017 0917   BASOSABS 0.0 05/14/2017 0917    No results found for: "POCLITH", "LITHIUM"   No results found for: "PHENYTOIN", "PHENOBARB", "VALPROATE", "CBMZ"   .res Assessment: Plan:    Rachael Callahan was seen today for follow-up, depression and adhd.  Diagnoses and all orders for this visit:  Recurrent major depression resistant to treatment [facility](HCC)  Attention deficit hyperactivity disorder (ADHD), predominantly inattentive type  Restless legs syndrome   Greater than 50% of 30 min face to face time with patient was spent on counseling and coordination of care.  Recent severe depression in Jan 2023 but resolved. But has had a number of bouts of depression in 2023.    Continue Concerta  to 72 mg daily for TRD and focus and attention. It helped when not currently depressed. Discussed potential benefits, risks, and side effects of stimulants with patient to include increased heart rate, palpitations, insomnia, increased anxiety, increased irritability, or decreased appetite.  Instructed patient to contact office if experiencing any significant tolerability issues.  Wean duloxetine bc lost benefit DC lithium bc didn't help and tremor.  Disc RLS less managed and already at sig dose of pramipexole 1 mg  so hesitate to increase.  Consider gabapentin.  She wants to defer.   Disc SE  including rare risks of compulsive behaviors.  Extensive discussion of tx options including Auvelity, repeating TMS, Spravato, TCA.  She will consider resuming tMS which helped years ago. Answered questions about reasons not to chose another sSRI, SNRI  Extensive discussion of Auvelity and SE and risk DDI with duloxetine might increase dizziness risk and if so will retry when off duloxetine.  Plan: Stop mirtazapine and lithium Reduce duloxetine to 60 mg daily for 1 week then 30 mg daily for 1 week then stop duloxetine Start Auvelity 1 in the AM for 1 week then 1 twice daily as long as dizziness is not a problem.  FU 8 weeks  Rachael Staggers, MD, DFAPA  Please see After Visit Summary for patient specific instructions.  No future appointments.   No orders of the defined types were placed in this encounter.     -------------------------------

## 2021-12-17 ENCOUNTER — Ambulatory Visit: Payer: Medicare HMO | Admitting: Psychiatry

## 2021-12-26 ENCOUNTER — Other Ambulatory Visit: Payer: Self-pay | Admitting: Psychiatry

## 2021-12-30 ENCOUNTER — Telehealth: Payer: Self-pay | Admitting: Psychiatry

## 2021-12-30 NOTE — Telephone Encounter (Signed)
Patient denied dizziness but said she had been jittery. She thought it was from coming off lithium, but she is off of it now and still jittery. No other side effects when questioned, but she isn't feeling better. She said she had been on the BID dosing about a week.   She last had TMS in 2019. She said she thought it was at [facility]. She said you told her that someone in our bldg does it. I told her I didn't think so. She mentioned Dr. Betti Cruz.

## 2021-12-30 NOTE — Telephone Encounter (Signed)
Patient lvm at 11:25 stating that she is "still having rough time after starting new medication for a while. Interested in receiving TMS and if CC could help her with that. Ph: [phone]

## 2021-12-30 NOTE — Telephone Encounter (Signed)
Yes, repeating TMS would be a fine idea.  We can refer her to [facility] or to [facility] to have it done.  She had at previously in about 2020 or 2021.  Can she remember where she did it at that time?  Switching from duloxetine to Auvelity is not the easiest switch.  Ask if she is having significant problems with dizziness from coming off the duloxetine or from going on to the [place].  What side effects that she having?  Or is it more of a problem of just not feeling better yet.  I would like her to give the Auvelity a chance if she can.

## 2021-12-30 NOTE — Telephone Encounter (Signed)
Patient is wanting to start TMS. Please advise on what is needed.

## 2021-12-30 NOTE — Telephone Encounter (Signed)
Please send refferal for TMS to [facility] and [facility] and see which gives her the better options.

## 2021-12-31 ENCOUNTER — Other Ambulatory Visit: Payer: Self-pay | Admitting: Psychiatry

## 2021-12-31 DIAGNOSIS — G2581 Restless legs syndrome: Secondary | ICD-10-CM

## 2022-01-01 NOTE — Telephone Encounter (Signed)
Jola Babinski sending referrals to [facility] and [facility]. Patient notified.

## 2022-01-03 ENCOUNTER — Other Ambulatory Visit: Payer: Self-pay | Admitting: Psychiatry

## 2022-01-03 NOTE — Telephone Encounter (Signed)
Referrals have been faxed to [facility] and [facility] [employer]

## 2022-01-06 ENCOUNTER — Telehealth: Payer: Self-pay | Admitting: Psychiatry

## 2022-01-06 NOTE — Telephone Encounter (Signed)
When patient calls to reports she is taking the medication, please verify how she is taking the medication.  If she taking it currently once or twice daily?  Because of the dizziness tell her to stay at 1 every morning until she is no longer having dizziness.  The usual dose is twice daily.ca

## 2022-01-06 NOTE — Telephone Encounter (Signed)
Make sure she is drinking plenty of fluids.

## 2022-01-06 NOTE — Telephone Encounter (Signed)
Pt stated she only was dizzy that one time and she feels better today.She is a little jittery but just wanted to let you know.Also she has a TMS consultation this week.

## 2022-01-06 NOTE — Telephone Encounter (Signed)
Pt lvm that last night she was dizzy spell. She fell last night in her room. She was not hurt. She has been on auvelity for a week now . Dr. Jennelle Human told her to call if she experienced and dizzy spells. Please call her at [phone]

## 2022-01-06 NOTE — Telephone Encounter (Signed)
Pt has been taking it in the morning and at bedtime.She is no longer dizzy and thinks med is working.She will call back if the dizziness occurs again while taking 2 daily

## 2022-01-07 ENCOUNTER — Telehealth: Payer: Self-pay | Admitting: Psychiatry

## 2022-01-07 NOTE — Telephone Encounter (Signed)
Pt LVM @ 12:31p.  She wanted to let Clarisse Gouge and Sheralyn Boatman know that she was on her second day of feeling good.  She said the nervousness is gone and she doesn't have any more dizziness.  She just wanted to let us know how she's doing and provide some good news.

## 2022-01-17 ENCOUNTER — Other Ambulatory Visit: Payer: Self-pay

## 2022-01-17 ENCOUNTER — Telehealth: Payer: Self-pay | Admitting: Psychiatry

## 2022-01-17 DIAGNOSIS — G2581 Restless legs syndrome: Secondary | ICD-10-CM

## 2022-01-17 MED ORDER — ALPRAZOLAM 0.25 MG PO TABS: ORAL_TABLET | ORAL | 0 refills | Status: DC

## 2022-01-17 NOTE — Telephone Encounter (Signed)
Pended.

## 2022-01-17 NOTE — Telephone Encounter (Signed)
Pt called requesting Rx for Alprazolam  .25 mg tablet. She's out. Reporting she is doing real well with depression still. [facility] 1/[facility]

## 2022-01-28 ENCOUNTER — Other Ambulatory Visit: Payer: Self-pay | Admitting: Psychiatry

## 2022-01-28 DIAGNOSIS — G2581 Restless legs syndrome: Secondary | ICD-10-CM

## 2022-01-31 ENCOUNTER — Other Ambulatory Visit: Payer: Self-pay

## 2022-01-31 ENCOUNTER — Telehealth: Payer: Self-pay | Admitting: Psychiatry

## 2022-01-31 DIAGNOSIS — F339 Major depressive disorder, recurrent, unspecified: Secondary | ICD-10-CM

## 2022-01-31 MED ORDER — METHYLPHENIDATE HCL ER (OSM) 36 MG PO TBCR: 72.0000 mg | EXTENDED_RELEASE_TABLET | Freq: Every day | ORAL | 0 refills | Status: DC

## 2022-01-31 NOTE — Telephone Encounter (Signed)
Pt called at 9:03a,  She would like refill of Methylphenidate 36mg  [facility] to  [id number] [address], [address], [place] [address] Phone: [phone]  Fax: [phone]    Next appt 1/29

## 2022-01-31 NOTE — Telephone Encounter (Signed)
Pended.

## 2022-02-04 ENCOUNTER — Other Ambulatory Visit: Payer: Self-pay | Admitting: Psychiatry

## 2022-02-04 DIAGNOSIS — F332 Major depressive disorder, recurrent severe without psychotic features: Secondary | ICD-10-CM

## 2022-02-07 ENCOUNTER — Telehealth: Payer: Self-pay | Admitting: Psychiatry

## 2022-02-07 ENCOUNTER — Other Ambulatory Visit: Payer: Self-pay | Admitting: Psychiatry

## 2022-02-07 MED ORDER — AUVELITY 45-105 MG PO TBCR: 1.0000 | EXTENDED_RELEASE_TABLET | Freq: Two times a day (BID) | ORAL | 1 refills | Status: DC

## 2022-02-07 NOTE — Telephone Encounter (Signed)
Is she able to get it at a regular pharmacy?

## 2022-02-07 NOTE — Telephone Encounter (Signed)
RX sent.  It will require PA.  Complete this ASAP.  Tell her not to run out and pick up samples if needed until PA is approved.

## 2022-02-07 NOTE — Telephone Encounter (Signed)
Usually yes.  I will sent TX

## 2022-02-07 NOTE — Telephone Encounter (Signed)
Rachael Callahan lvm stating she was given samples of Auvelity. She stated she is please with the results and would like a Rx sent to [place].  Appointment scheduled for 02/24/22 Contact information # [phone]

## 2022-02-07 NOTE — Telephone Encounter (Signed)
Pt informed

## 2022-02-09 ENCOUNTER — Other Ambulatory Visit: Payer: Self-pay | Admitting: Psychiatry

## 2022-02-11 ENCOUNTER — Telehealth: Payer: Self-pay

## 2022-02-11 NOTE — Telephone Encounter (Signed)
Tried to submit a PA but response it's not needed so I will contact [employer] to clarify.

## 2022-02-11 NOTE — Telephone Encounter (Signed)
Tried to submit a Prior Authorization for Auvelity 45-105 mg with [facility] [employer], response is no PA needed per representative. She also reports there was a paid claim yesterday so I am not sure why pharmacy reports a PA is needed.

## 2022-02-12 NOTE — Telephone Encounter (Signed)
30 tablets will get her to appt

## 2022-02-12 NOTE — Telephone Encounter (Signed)
Pt stated the pharmacy informed her it will be $400 and this is not affordable.She is coming to get samples today

## 2022-02-14 NOTE — Telephone Encounter (Signed)
Spoke to pt

## 2022-02-14 NOTE — Telephone Encounter (Signed)
Itali lvm requesting clarification on the directions of the samples she picked up on yesterday. Contact # [phone]

## 2022-02-24 ENCOUNTER — Encounter: Payer: Self-pay | Admitting: Psychiatry

## 2022-02-24 ENCOUNTER — Ambulatory Visit (INDEPENDENT_AMBULATORY_CARE_PROVIDER_SITE_OTHER): Payer: Medicare HMO | Admitting: Psychiatry

## 2022-02-24 VITALS — BP 134/79 | HR 73

## 2022-02-24 DIAGNOSIS — F900 Attention-deficit hyperactivity disorder, predominantly inattentive type: Secondary | ICD-10-CM | POA: Diagnosis not present

## 2022-02-24 DIAGNOSIS — F339 Major depressive disorder, recurrent, unspecified: Secondary | ICD-10-CM | POA: Diagnosis not present

## 2022-02-24 DIAGNOSIS — G2581 Restless legs syndrome: Secondary | ICD-10-CM

## 2022-02-24 NOTE — Progress Notes (Signed)
Rachael Callahan [id number] 11-21-1945 77 y.o.  Subjective:   Patient ID:  Rachael Callahan is a 77 y.o. (DOB [birth year]) female.  Chief Complaint:  Chief Complaint  Patient presents with   Follow-up   Depression   ADHD    Depression        Associated symptoms include no decreased concentration and no suicidal ideas.  Rachael Callahan presents to the office today for follow-up of TRD.  Prior addition of Concerta had helped with depression and productivity and interest and alertness.  visit August 2020.  She was doing generally well and no meds were changed.  03/2019 appt with the following noted: BP up here but reports it has been normal at home.   No new problems.  Still good On Concerta to 54 mg daily and duloxetine 120 mg since Jan 2020.  Has been doing well.  Overall the combination of meds is effective.  More productive and more interested and no longer depressed.  No longer napping.   Busy summer.  D in law brought 2 gkids from [place] and that was great.  GS 8 years older than adopted GD and they do well together. Thinks TMS helped some also. RLS managed usually with daily pramipexole. Xanax use a couple times per month. Often over concerns with grand-daughter's mother who causes problems. Minimal anxiety .  Sleep good and less napping.  Patient reports stable mood and denies depressed or irritable moods.  Patient denies any recent difficulty with anxiety.  Patient denies difficulty with sleep initiation or maintenance. Night owl and gets 7-8 hours.  No napping.   Denies appetite disturbance.  Patient reports that energy and motivation have been good.  Patient denies any difficulty with concentration.  Patient denies any suicidal ideation. Concerned about GD who's not doing well with psych treatment.  Had seen [name] before but transferred.  GD Bipolar disorder dx.  Se[facility] at [facility].  Also had hosp.  Plan:  No meds changed  10/11/19 appt with the following noted: Covid free.   Vaccinated. Pretty good mood. Satisfied with meds otherwise. Last week problems with RLS after being stable for awhile.  No more caffeine than usual. No SE. Patient reports stable mood and denies depressed or irritable moods.  Patient denies any recent difficulty with anxiety.  Patient denies difficulty with sleep initiation or maintenance. Denies appetite disturbance.  Patient reports that energy and motivation have been good.  Patient denies any difficulty with concentration.  Patient denies any suicidal ideation. Uses Xanax occ. [name] well. GD 26 and needs for insurance to see doc in [facility]. Plan: no changes  04/11/2020 appointment with the following noted: Since here concerta increased to 72 mg AM.  I think it's what I needed.  More motivated and energetic and productive but not normal. Still on duloxetine 120 without SE.   Mood good.  Patient reports stable mood and denies depressed or irritable moods.  Patient denies any recent difficulty with anxiety.  Patient denies difficulty with sleep initiation or maintenance, but night owl so sleep is messed up with retirement. Denies appetite disturbance.  Patient reports that energy and motivation have been good.  Patient denies any difficulty with concentration.  Patient denies any suicidal ideation. RLS managed.   Plan :  No med changes and she agrees.  10/11/2020 appointment with the following noted: Xanax 3-4 times per month. RLS managed with pramipexole and muscle relaxing cream. No sig depression.  Satisfied with meds.  Occ situational  mood problems. Patient reports stable mood and denies depressed or irritable moods.  Patient denies any recent difficulty with anxiety.  Patient denies difficulty with sleep initiation or maintenance. Denies appetite disturbance.  Patient reports that energy and motivation have been good.  Patient denies any difficulty with concentration.  Patient denies any suicidal ideation. Stress GD married last  year to cocaine addict. No SE Initial insomnia at night but can sleep better in the day. 1 cup AM coffee. Plan: No med changes  02/26/2021 phone call from husband and also discussed with patient.  MD note as follows: RTC  [phone] Rachael Callahan She's been in bed for a couple of days and doesn't feel life is worth living.  Not directly voiced SI.  Only like this for a couple of days.  She doesn't desire to go anywhere.  He knows of no trigger except ongoing family issues.  Disc with pt Felt bad for a couple of mos after virus in December.  Not Covid.  Cough ongoing.  Don't feel like doing anything.  No energy.  No interest.  Everything seems like too much to deal with.  No med changes. No SI but feels useless.  Seemed OK prior to virus. Change in Concerta recently about a week or 2 ago.  Plan: Would prefer something that could have a fairly quick effect and not have to change the Cymbalta given her history of doing well on it until recently. She did not respond to Abilify or Rexulti but she might respond to Vraylar 1.5 mg every morning.  It is quick-acting and compatible with her duloxetine and Concerta.  Disc discussed side effects.  She is not suicidal and does not need hospitalization at this time. Her husband will come pick up samples of Vraylar 1.5 mg 1 every morning starting tomorrow.  Rachael Parents, MD, [facility]  04/30/2021 appointment with the following noted: Several phone calls since she was here. Took Vryalar for a week and saw benefit witout SE and then stopped it. Felt fine for awhile.  Then had hard time last weekend and took another Vraylar bc felt weepy. Today feels fine with mood..  Generally low anxiety. Rare alprazolam. Taking generic Concerta without a problem. No SE. Not much dep in last 6 mos except as noted. GD pregnant with first [name].  Concerned about GD physical's and mental health. GD married.   GD sober. Plan: continue Concerta 72, duloxetine 120  05/21/21 took  Vraylar again for depression and felt better and rX sent.  Cost was a problem so PT assistance forms mailed to her.  07/12/21 TC complaining of sleepiness and relating it to [place].  Dose reduced to QOD  08/01/21 No show  10/03/21 TC complaining of depression. Sent Rx mirtazapine 30 mg HS.  Still taking Vraylar 1.5 QOD.  Was told to stop it since it was apparently no lonnger helping  12/10/21 appt noted:  seen with Rachael Callahan A lot of phone calls since here with depressive sx. Current psych meds: duloxetine 90, lithium 300, Ritalin 72 AM, mirtazapine 30. Depression worse in the last couple of week. SE lithium tremor and jittery.  Therefore needing alprazolam more often. Last free of dep about 6 weeks ago. Sleeping too much.  Sleep to escape depression.   Asks about Zoloft.   Plan: Plan: Stop mirtazapine and lithium Reduce duloxetine to 60 mg daily for 1 week then 30 mg daily for 1 week then stop duloxetine Start Auvelity 1 in the AM for 1  week then 1 twice daily as long as dizziness is not a problem.  02/24/22 appt noted: Several phone calls since she was here.  Jittery on [place] and stopped it. Referral sent for TMS. Then restarted Auvelity and wanted a prescription February 07, 2022 Taking Auvelity twice daily since here.   Meds: Auvelity BID (AM & HS), Concerta 72 AM, pramipexole 1 mg HS.  No SE Didn't notice anything profound coming off other meds but does feel better generally.  Doesn't feel worse.  Stressful family sensation. Feels a little odd sometimes like I'm not totally engaged and don't have energy she wisshes long term.  Depression is better but not gone. Sleep is good.  Appetite good. No sig RLS with pramipexole. Planning to move to where D lives  in [facility] and build house. Enjoys theater and music.  Psychiatric medications tried include fluoxetine bupropion, lithium, Trintellix, venlafaxine, duloxetine 120 + Concerta 54, Mirtazapine 30 without help for  depression pramipexole 1,  Latuda 40 mg, Rexulti 2 mg with no benefit, Vraylar 1.5 had benefit and then lost it.  TMS helped, 2019    Review of Systems:  Review of Systems  Cardiovascular:  Negative for chest pain.  Musculoskeletal:  Positive for arthralgias.  Neurological:  Negative for dizziness and tremors.       RLS is temporarily worse.  Psychiatric/Behavioral:  Positive for dysphoric mood. Negative for agitation, behavioral problems, confusion, decreased concentration, hallucinations, self-injury, sleep disturbance and suicidal ideas. The patient is not nervous/anxious and is not hyperactive.     Medications: I have reviewed the patient's current medications.  Current Outpatient Medications  Medication Sig Dispense Refill   ALPRAZolam (XANAX) 0.25 MG tablet TAKE 1-2 TABLETS EVERYDAY AS NEEDED ANXIETY 30 tablet 0   Cholecalciferol 25 MCG (1000 UT) capsule Take by mouth.     Dextromethorphan-buPROPion ER (AUVELITY) 45-105 MG TBCR Take 1 tablet by mouth 2 (two) times daily. 60 tablet 1   losartan (COZAAR) 100 MG tablet Take 100 mg by mouth daily.  1   methylphenidate 36 MG PO CR tablet Take 2 tablets (72 mg total) by mouth daily. 60 tablet 0   methylphenidate 36 MG PO CR tablet Take 2 tablets (72 mg total) by mouth daily. 60 tablet 0   methylphenidate 36 MG PO CR tablet Take 2 tablets (72 mg total) by mouth daily. 60 tablet 0   metoprolol succinate (TOPROL-XL) 50 MG 24 hr tablet Take 50 mg by mouth daily.     mirtazapine (REMERON) 30 MG tablet TAKE 1 TABLET BY MOUTH EVERYDAY AT BEDTIME 90 tablet 0   Multiple Vitamins-Minerals (MULTIVITAMIN ADULT PO) Take by mouth.     omeprazole (PRILOSEC) 40 MG capsule Take 40 mg by mouth daily.  1   pramipexole (MIRAPEX) 1 MG tablet TAKE 1 TABLET BY MOUTH EVERYDAY AT BEDTIME 90 tablet 0   pravastatin (PRAVACHOL) 10 MG tablet Take 10 mg by mouth daily.  3   Probiotic Product (PROBIOTIC-10 PO) Take by mouth.     No current facility-administered  medications for this visit.    Medication Side Effects: None  Allergies: No Known Allergies  Past Medical History:  Diagnosis Date   Depression    High cholesterol    Hypertension    Migraine    Stomach ulcer     History reviewed. No pertinent family history.  Social History   Socioeconomic History   Marital status: Married    Spouse name: Not on file   Number of children:  Not on file   Years of education: Not on file   Highest education level: Not on file  Occupational History   Not on file  Tobacco Use   Smoking status: Never   Smokeless tobacco: Never  Vaping Use   Vaping Use: Never used  Substance and Sexual Activity   Alcohol use: Yes    Comment: rarely   Drug use: Never   Sexual activity: Not on file  Other Topics Concern   Not on file  Social History Narrative   Not on file   Social Determinants of Health   Financial Resource Strain: Not on file  Food Insecurity: Not on file  Transportation Needs: Not on file  Physical Activity: Not on file  Stress: Not on file  Social Connections: Not on file  Intimate Partner Violence: Not on file    Past Medical History, Surgical history, Social history, and Family history were reviewed and updated as appropriate.   Please see review of systems for further details on the patient's review from today.   Objective:   Physical Exam:  BP 134/79   Pulse 73   Physical Exam Constitutional:      General: She is not in acute distress.    Appearance: She is well-developed.  Musculoskeletal:        General: No deformity.  Neurological:     Mental Status: She is alert and oriented to person, place, and time.     Motor: No tremor.     Coordination: Coordination normal.     Gait: Gait normal.  Psychiatric:        Attention and Perception: Perception normal. She is attentive.        Mood and Affect: Mood is depressed. Mood is not anxious. Affect is not labile, blunt, angry or inappropriate.        Speech: Speech  normal.        Behavior: Behavior normal.        Thought Content: Thought content normal. Thought content is not delusional. Thought content does not include homicidal or suicidal ideation. Thought content does not include suicidal plan.        Cognition and Memory: Cognition normal.        Judgment: Judgment normal.     Comments: Insight intact. No auditory or visual hallucinations. No delusions.  Talkative without pressure     Lab Review:     Component Value Date/Time   NA 141 05/14/2017 0917   K 4.4 05/14/2017 0917   CL 104 05/14/2017 0917   CO2 28 05/14/2017 0917   GLUCOSE 112 (H) 05/14/2017 0917   BUN 16 05/14/2017 0917   CREATININE 0.69 05/14/2017 0917   CALCIUM 9.3 05/14/2017 0917   GFRNONAA >60 05/14/2017 0917   GFRAA >60 05/14/2017 0917       Component Value Date/Time   WBC 6.3 05/14/2017 0917   RBC 4.58 05/14/2017 0917   HGB 14.2 05/14/2017 0917   HCT 41.7 05/14/2017 0917   PLT 235 05/14/2017 0917   MCV 91.0 05/14/2017 0917   MCH 31.0 05/14/2017 0917   MCHC 34.1 05/14/2017 0917   RDW 13.1 05/14/2017 0917   LYMPHSABS 1.4 05/14/2017 0917   MONOABS 0.5 05/14/2017 0917   EOSABS 0.2 05/14/2017 0917   BASOSABS 0.0 05/14/2017 0917    No results found for: "POCLITH", "LITHIUM"   No results found for: "PHENYTOIN", "PHENOBARB", "VALPROATE", "CBMZ"   .res Assessment: Plan:    Rachael Callahan was seen today for follow-up, depression and  adhd.  Diagnoses and all orders for this visit:  Recurrent major depression resistant to treatment [facility])  Attention deficit hyperactivity disorder (ADHD), predominantly inattentive type  Restless legs syndrome   Greater than 50% of 30 min face to face time with patient was spent on counseling and coordination of care.  Recent severe depression in Jan 2023 getting better then worse.  But has had a number of bouts of depression in 2023.    Continue Concerta to 72 mg daily for TRD and focus and attention. It helped when not currently  depressed. Discussed potential benefits, risks, and side effects of stimulants with patient to include increased heart rate, palpitations, insomnia, increased anxiety, increased irritability, or decreased appetite.  Instructed patient to contact office if experiencing any significant tolerability issues.  Disc RLS less managed and already at sig dose of pramipexole 1 mg  so hesitate to increase.  Consider gabapentin.  She wants to defer.   Disc SE including rare risks of compulsive behaviors.  Extensive discussion of tx options including Auvelity, repeating TMS, Spravato, TCA.  She will consider resuming tMS which helped years ago.  Answered questions about reasons not to chose another sSRI, SNRI  Extensive discussion of Auvelity and SE and risk DDI with duloxetine might increase dizziness risk and if so will retry when off duloxetine.  Plan: continue Auvelity BID, Concerta 36 AM, pramipexole 1 mg HS Still being evaluated for TMS Extensive disc of alternative Spravato. Cost may be a problem using Auvelity.  We talked about using generic meds but it would be a combination of a lot of pills and mor eprone to mistakes and noncompliance DT complexity  FU 8 weeks  Meredith Staggers, MD, DFAPA  Please see After Visit Summary for patient specific instructions.  No future appointments.   No orders of the defined types were placed in this encounter.     -------------------------------

## 2022-03-06 ENCOUNTER — Other Ambulatory Visit: Payer: Self-pay

## 2022-03-06 DIAGNOSIS — F339 Major depressive disorder, recurrent, unspecified: Secondary | ICD-10-CM

## 2022-03-06 NOTE — Telephone Encounter (Signed)
Pended.

## 2022-03-06 NOTE — Telephone Encounter (Signed)
Pt Lvm @ 9:40a.  She is asking if there's been any more progress of approval for Auvelity.  She is almost out of samples again.  Also she would like refill of Methylphenidate ER 36mg  sent to   [id number] [place], [address], [place] [address] Phone: [phone]  Fax: [phone]   Next appt 4/2

## 2022-03-07 ENCOUNTER — Other Ambulatory Visit: Payer: Self-pay | Admitting: Psychiatry

## 2022-03-07 ENCOUNTER — Telehealth: Payer: Self-pay

## 2022-03-07 MED ORDER — METHYLPHENIDATE HCL ER (OSM) 36 MG PO TBCR: 72.0000 mg | EXTENDED_RELEASE_TABLET | Freq: Every day | ORAL | 0 refills | Status: DC

## 2022-03-07 NOTE — Telephone Encounter (Addendum)
Patient said duloxetine did not control her depression and she doesn't know what to do. She got one bottle of samples today so she is good for a couple of weeks.  I'll have Leda Gauze reach out to her next week to assist with patient assistance for [employer].   She did say something about 2 different generics. I'm not sure if she is talking about getting the 2 different medications in Auvelity separately.

## 2022-03-07 NOTE — Telephone Encounter (Signed)
It is part of the protocol that you have to be on an antidepressant with [place] as far as I know, unless they changed it.  If not Auvelity will need to pick something else.  She could go back to duloxetine if she prefers or pursue pt assistance with Auvelity.

## 2022-03-10 ENCOUNTER — Telehealth: Payer: Self-pay | Admitting: Psychiatry

## 2022-03-10 NOTE — Telephone Encounter (Signed)
Next visit is 04/29/22. Rachael Callahan came into the office to speak to a nurse about her Auvility issues. She has been taking Auvility for a month and has had two dizzy spells during this time. She fell and bruised her knee and wrist at different times during a dizzy spell. Please call her at [phone]. [facility]   [id number] [place] [address]  Phone: [phone] Fax: [phone]

## 2022-03-10 NOTE — Telephone Encounter (Signed)
LVM that I would try to reach her later. Patient had these same complaints in December and issues resolved.

## 2022-03-11 NOTE — Telephone Encounter (Signed)
Tried calling patient again and it goes straight to VM.  Will call again.

## 2022-03-11 NOTE — Telephone Encounter (Signed)
Sent message via MyChart and left VM that this was done.

## 2022-03-18 NOTE — Telephone Encounter (Signed)
N[place]ne is seeking Pt. Assistance for [employer].  They really don't have a good program.  The Drug Rep is going to bring by a form, but she said not to put high hopes in getting approval below $150.  She suggested we try to get a tier reduction.  We have haven't had a lot of success on tier reduction, but can we try?  I will look at the form brought to [place] and call the program to see what the process is.  Just be advised that we may not be able to help with this medication.  We will see.

## 2022-03-21 ENCOUNTER — Other Ambulatory Visit: Payer: Self-pay

## 2022-03-21 ENCOUNTER — Telehealth: Payer: Self-pay | Admitting: Psychiatry

## 2022-03-21 DIAGNOSIS — G2581 Restless legs syndrome: Secondary | ICD-10-CM

## 2022-03-21 MED ORDER — ALPRAZOLAM 0.25 MG PO TABS: ORAL_TABLET | ORAL | 1 refills | Status: DC

## 2022-03-21 NOTE — Telephone Encounter (Signed)
Pended.

## 2022-03-21 NOTE — Telephone Encounter (Signed)
Patient lvm for refill for Alprazolam 0.'25mg'$  to be [facility] [place]. Ph: [phone] Appt 4/2

## 2022-04-01 ENCOUNTER — Telehealth: Payer: Self-pay | Admitting: Psychiatry

## 2022-04-01 NOTE — Telephone Encounter (Signed)
Rachael Callahan is going to her [place] appts with Dr. Reece Levy. She said she can't stay awake and really doesn't feel like doing anything. Has an appointment on 4/2 with Dr. Clovis Pu. Please call her at [phone].

## 2022-04-02 NOTE — Telephone Encounter (Signed)
Patient said she had [place] mapping on 2/9, first treatment on 2/12. She said she has missed some treatments and has not kept count of how many she has had.   Gave her the recommendations for mirtazapine and Auvelity.

## 2022-04-02 NOTE — Telephone Encounter (Signed)
Please ask when she started [place] that is the start date and how many treatments she has had so far.  It helped her in the past.  Have her stop the mirtazapine because it is not helping depression and apparently she is sleeping a lot.  It may be making her tired. She had dizziness before when she took Auvelity twice daily but if she feels she is tolerating it well now she might retry 1 every 12 hours because that is the normal dose and it may be more effective for her depressive symptoms.  Note to MD:  consider increasing pramipexole as off label tx of TRD.  She takes current dose for RLS

## 2022-04-02 NOTE — Telephone Encounter (Signed)
Noted thank you.  So she is not half way through [place] yet and may get benefit from it as she did in the past

## 2022-04-02 NOTE — Telephone Encounter (Signed)
Patient is undergoing TMS treatments. She said she is having difficulty staying awake and just wants to lay on the couch all day and not do anything. She denied SI because she wouldn't do that to her family and it is against her faith, though she says some days it is just unbearable. Rates depression currently as 7/10. I asked if she had mentioned this to Dr. Reece Levy and he is not at that location usually. She did speak to someone else in the office and was told that sometimes there is a dip in the progress. She is supposed to be getting some written information in this regards.   She is taking: Alprazolam prn and said she has needed it more lately. She last filled #30 on 2/23, prior fill was 12/22.  Auvelity - is taking 1 tablet qd Methylphenidate 72 mg Mirtazapine 30 mg QHS Pramipexole 1 mg QHS

## 2022-04-02 NOTE — Telephone Encounter (Signed)
Called patient and she was not home to provide [place] info. She said she would call me when she got home and provide the info and I told her I had other recommendations I would provide at that time.

## 2022-04-03 NOTE — Telephone Encounter (Signed)
LVM to RC 

## 2022-04-03 NOTE — Telephone Encounter (Signed)
Pt lvm that she would like bridget to call her back. She has some questions. Please call her at [phone]

## 2022-04-04 NOTE — Telephone Encounter (Signed)
Left second VM to RC.  

## 2022-04-06 NOTE — Telephone Encounter (Signed)
Patient called and I was not available but said she didn't know what I needed.

## 2022-04-15 ENCOUNTER — Telehealth: Payer: Self-pay | Admitting: Psychiatry

## 2022-04-15 NOTE — Telephone Encounter (Signed)
Pt lvm that she would like a script of auvelity to be sent to her pharmacy. She has been on samples and wants to see how much nit will cost.[facility] [address] [facility] [place]

## 2022-04-16 NOTE — Telephone Encounter (Signed)
Patient notified that an Rx had been sent in January, [place] had been approved.

## 2022-04-20 ENCOUNTER — Other Ambulatory Visit: Payer: Self-pay | Admitting: Psychiatry

## 2022-04-20 DIAGNOSIS — F332 Major depressive disorder, recurrent severe without psychotic features: Secondary | ICD-10-CM

## 2022-04-20 DIAGNOSIS — G2581 Restless legs syndrome: Secondary | ICD-10-CM

## 2022-04-22 ENCOUNTER — Telehealth: Payer: Self-pay | Admitting: Psychiatry

## 2022-04-22 NOTE — Telephone Encounter (Signed)
Patient said she cannot afford Auvelity and was asking about sending in Rx for the 2 components. She would like to try this or go back to Cymbalta.  I can send in scripts if agreeable.   [facility]

## 2022-04-22 NOTE — Telephone Encounter (Signed)
I would like her to pick up samples and keep taking it.  I will see her next week and we can discuss a switch at that time to either the generic ingredients of Auvelity or perhaps back to Cymbalta or some other option.  I do not want to make this decision about switching medicines outside of the appointment that we have upcoming so I want her to just pick up samples.

## 2022-04-22 NOTE — Telephone Encounter (Signed)
Pt Lvm @ 9:46a.  She said she would like to find out from Dr Clovis Pu the 2 generic medicines that he says act like Auvelity.  The Auvelity is still $437 even after the PA.  She can't afford that.  She also asked if she could go "cold [place]" and go back to Duloxetine.  She said she will be stopping by our office today after 10:30 when she has her appt for [place] next door.  Next appt 4/2

## 2022-04-23 NOTE — Telephone Encounter (Signed)
Pulled 1 bottle of samples and notified patient of recommendations.

## 2022-04-29 ENCOUNTER — Ambulatory Visit (INDEPENDENT_AMBULATORY_CARE_PROVIDER_SITE_OTHER): Payer: Medicare HMO | Admitting: Psychiatry

## 2022-04-29 ENCOUNTER — Encounter: Payer: Self-pay | Admitting: Psychiatry

## 2022-04-29 DIAGNOSIS — G2581 Restless legs syndrome: Secondary | ICD-10-CM

## 2022-04-29 DIAGNOSIS — F339 Major depressive disorder, recurrent, unspecified: Secondary | ICD-10-CM

## 2022-04-29 DIAGNOSIS — F900 Attention-deficit hyperactivity disorder, predominantly inattentive type: Secondary | ICD-10-CM

## 2022-04-29 MED ORDER — NORTRIPTYLINE HCL 25 MG PO CAPS: ORAL_CAPSULE | ORAL | 1 refills | Status: DC

## 2022-04-29 MED ORDER — PRAMIPEXOLE DIHYDROCHLORIDE 1 MG PO TABS: ORAL_TABLET | ORAL | 0 refills | Status: DC

## 2022-04-29 NOTE — Progress Notes (Signed)
Rachael Callahan [id number] May 02, 1945 77 y.o.  Subjective:   Patient ID:  Rachael Callahan is a 77 y.o. (DOB [birth year]) female.  Chief Complaint:  Chief Complaint  Patient presents with   Follow-up   Depression    Depression        Associated symptoms include no decreased concentration and no suicidal ideas.  Rachael Callahan presents to the office today for follow-up of TRD.  Prior addition of Concerta had helped with depression and productivity and interest and alertness.  visit August 2020.  She was doing generally well and no meds were changed.  03/2019 appt with the following noted: BP up here but reports it has been normal at home.   No new problems.  Still good On Concerta to 54 mg daily and duloxetine 120 mg since Jan 2020.  Has been doing well.  Overall the combination of meds is effective.  More productive and more interested and no longer depressed.  No longer napping.   Busy summer.  D in law brought 2 gkids from [place] and that was great.  GS 36 years older than adopted Rachael Callahan and they do well together. Thinks TMS helped some also. RLS managed usually with daily pramipexole. Xanax use a couple times per month. Often over concerns with grand-daughter's mother who causes problems. Minimal anxiety .  Sleep good and less napping.  Patient reports stable mood and denies depressed or irritable moods.  Patient denies any recent difficulty with anxiety.  Patient denies difficulty with sleep initiation or maintenance. Night owl and gets 7-8 hours.  No napping.   Denies appetite disturbance.  Patient reports that energy and motivation have been good.  Patient denies any difficulty with concentration.  Patient denies any suicidal ideation. Concerned about Rachael Callahan who's not doing well with psych treatment.  Had seen Rachael Callahan before but transferred.  Rachael Callahan Bipolar disorder dx.  Seen at [facility].  Also had hosp.  Plan:  No meds changed  10/11/19 appt with the following noted: Covid free.   Vaccinated. Pretty good mood. Satisfied with meds otherwise. Last week problems with RLS after being stable for awhile.  No more caffeine than usual. No SE. Patient reports stable mood and denies depressed or irritable moods.  Patient denies any recent difficulty with anxiety.  Patient denies difficulty with sleep initiation or maintenance. Denies appetite disturbance.  Patient reports that energy and motivation have been good.  Patient denies any difficulty with concentration.  Patient denies any suicidal ideation. Uses Xanax occ. [name] well. Rachael Callahan 27 and needs for insurance to see doc in [facility]. Plan: no changes  04/11/2020 appointment with the following noted: Since here concerta increased to 72 mg AM.  I think it's what I needed.  More motivated and energetic and productive but not normal. Still on duloxetine 120 without SE.   Mood good.  Patient reports stable mood and denies depressed or irritable moods.  Patient denies any recent difficulty with anxiety.  Patient denies difficulty with sleep initiation or maintenance, but night owl so sleep is messed up with retirement. Denies appetite disturbance.  Patient reports that energy and motivation have been good.  Patient denies any difficulty with concentration.  Patient denies any suicidal ideation. RLS managed.   Plan :  No med changes and she agrees.  10/11/2020 appointment with the following noted: Xanax 3-4 times per month. RLS managed with pramipexole and muscle relaxing cream. No sig depression.  Satisfied with meds.  Occ situational mood problems. Patient  reports stable mood and denies depressed or irritable moods.  Patient denies any recent difficulty with anxiety.  Patient denies difficulty with sleep initiation or maintenance. Denies appetite disturbance.  Patient reports that energy and motivation have been good.  Patient denies any difficulty with concentration.  Patient denies any suicidal ideation. Stress Rachael Callahan married last  year to cocaine addict. No SE Initial insomnia at night but can sleep better in the day. 1 cup AM coffee. Plan: No med changes  02/26/2021 phone call from husband and also discussed with patient.  MD note as follows: RTC  [phone] [name] She's been in bed for a couple of days and doesn't feel life is worth living.  Not directly voiced SI.  Only like this for a couple of days.  She doesn't desire to go anywhere.  He knows of no trigger except ongoing family issues.  Disc with pt Felt bad for a couple of mos after virus in December.  Not Covid.  Cough ongoing.  Don't feel like doing anything.  No energy.  No interest.  Everything seems like too much to deal with.  No med changes. No SI but feels useless.  Seemed OK prior to virus. Change in Concerta recently about a week or 2 ago.  Plan: Would prefer something that could have a fairly quick effect and not have to change the Cymbalta given her history of doing well on it until recently. She did not respond to Abilify or Rexulti but she might respond to Vraylar 1.5 mg every morning.  It is quick-acting and compatible with her duloxetine and Concerta.  Disc discussed side effects.  She is not suicidal and does not need hospitalization at this time. Her husband will come pick up samples of Vraylar 1.5 mg 1 every morning starting tomorrow.  Lynder Parents, MD, [facility]  04/30/2021 appointment with the following noted: Several phone calls since she was here. Took Vryalar for a week and saw benefit witout SE and then stopped it. Felt fine for awhile.  Then had hard time last weekend and took another Vraylar bc felt weepy. Today feels fine with mood..  Generally low anxiety. Rare alprazolam. Taking generic Concerta without a problem. No SE. Not much dep in last 6 mos except as noted. Rachael Callahan pregnant with first [name].  Concerned about Rachael Callahan physical's and mental health. Rachael Callahan married.   Rachael Callahan sober. Plan: continue Concerta 72, duloxetine 120  05/21/21 took  Vraylar again for depression and felt better and rX sent.  Cost was a problem so PT assistance forms mailed to her.  07/12/21 TC complaining of sleepiness and relating it to [place].  Dose reduced to QOD  08/01/21 No show  10/03/21 TC complaining of depression. Sent Rx mirtazapine 30 mg HS.  Still taking Vraylar 1.5 QOD.  Was told to stop it since it was apparently no lonnger helping  12/10/21 appt noted:  seen with [name] A lot of phone calls since here with depressive sx. Current psych meds: duloxetine 90, lithium 300, Ritalin 72 AM, mirtazapine 30. Depression worse in the last couple of week. SE lithium tremor and jittery.  Therefore needing alprazolam more often. Last free of dep about 6 weeks ago. Sleeping too much.  Sleep to escape depression.   Asks about Zoloft.   Plan: Plan: Stop mirtazapine and lithium Reduce duloxetine to 60 mg daily for 1 week then 30 mg daily for 1 week then stop duloxetine Start Auvelity 1 in the AM for 1 week then 1  twice daily as long as dizziness is not a problem.  02/24/22 appt noted: Several phone calls since she was here.  Jittery on [place] and stopped it. Referral sent for [place]. Then restarted Auvelity and wanted a prescription February 07, 2022 Taking Auvelity twice daily since here.   Meds: Auvelity BID (AM & HS), Concerta 72 AM, pramipexole 1 mg HS.  No SE Didn't notice anything profound coming off other meds but does feel better generally.  Doesn't feel worse.  Stressful family sensation. Feels a little odd sometimes like I'm not totally engaged and don't have energy she wisshes long term.  Depression is better but not gone. Sleep is good.  Appetite good. No sig RLS with pramipexole. Planning to move to where D lives  in [facility] and build house. Enjoys theater and music. Plan: continue Auvelity BID, Concerta 36 AM, pramipexole 1 mg HS Still being evaluated for [place]  04/29/22 appt noted: Several phone calls since here with various  problems. Weaning TMS  and maybe somewhat helpful. Still so low energy.  Days of feeling sad withotu reason.  No recent crying spells-TMS helped.  Better motivation later in the day.  Not much interest or enjoyment.   No change off duloxetine and on Auvelity. No SE now.     Psychiatric medications tried include fluoxetine,  venlafaxine,  bupropion,  lithium,  Trintellix, duloxetine 123456 + Concerta 54, Mirtazapine 30 without help for depression Auvelity NR pramipexole 1,  Latuda 40 mg, Rexulti 2 mg with no benefit, Vraylar 1.5 had benefit and then lost it.  TMS helped, 2019    Review of Systems:  Review of Systems  Cardiovascular:  Negative for chest pain.  Musculoskeletal:  Positive for arthralgias.  Neurological:  Negative for dizziness and tremors.       RLS is temporarily worse.  Psychiatric/Behavioral:  Positive for dysphoric mood. Negative for agitation, behavioral problems, confusion, decreased concentration, hallucinations, self-injury, sleep disturbance and suicidal ideas. The patient is not nervous/anxious and is not hyperactive.     Medications: I have reviewed the patient's current medications.  Current Outpatient Medications  Medication Sig Dispense Refill   ALPRAZolam (XANAX) 0.25 MG tablet TAKE 1-2 TABLETS EVERYDAY AS NEEDED ANXIETY 30 tablet 1   Cholecalciferol 25 MCG (1000 UT) capsule Take by mouth.     losartan (COZAAR) 100 MG tablet Take 100 mg by mouth daily.  1   methylphenidate 36 MG PO CR tablet Take 2 tablets (72 mg total) by mouth daily. 60 tablet 0   methylphenidate 36 MG PO CR tablet Take 2 tablets (72 mg total) by mouth daily. 60 tablet 0   [START ON 05/01/2022] methylphenidate 36 MG PO CR tablet Take 2 tablets (72 mg total) by mouth daily. 60 tablet 0   metoprolol succinate (TOPROL-XL) 50 MG 24 hr tablet Take 50 mg by mouth daily.     Multiple Vitamins-Minerals (MULTIVITAMIN ADULT PO) Take by mouth.     nortriptyline (PAMELOR) 25 MG capsule 1 at night  for 5 nights , then 2 at night for 5 nights, then 3 at night 75 capsule 1   omeprazole (PRILOSEC) 40 MG capsule Take 40 mg by mouth daily.  1   pravastatin (PRAVACHOL) 10 MG tablet Take 10 mg by mouth daily.  3   Probiotic Product (PROBIOTIC-10 PO) Take by mouth.     pramipexole (MIRAPEX) 1 MG tablet 1/2 tablet in the AM and 1 at night 45 tablet 0   No current facility-administered medications for  this visit.    Medication Side Effects: None  Allergies: No Known Allergies  Past Medical History:  Diagnosis Date   Depression    High cholesterol    Hypertension    Migraine    Stomach ulcer     History reviewed. No pertinent family history.  Social History   Socioeconomic History   Marital status: Married    Spouse name: Not on file   Number of children: Not on file   Years of education: Not on file   Highest education level: Not on file  Occupational History   Not on file  Tobacco Use   Smoking status: Never   Smokeless tobacco: Never  Vaping Use   Vaping Use: Never used  Substance and Sexual Activity   Alcohol use: Yes    Comment: rarely   Drug use: Never   Sexual activity: Not on file  Other Topics Concern   Not on file  Social History Narrative   Not on file   Social Determinants of Health   Financial Resource Strain: Not on file  Food Insecurity: Not on file  Transportation Needs: Not on file  Physical Activity: Not on file  Stress: Not on file  Social Connections: Not on file  Intimate Partner Violence: Not on file    Past Medical History, Surgical history, Social history, and Family history were reviewed and updated as appropriate.   Please see review of systems for further details on the patient's review from today.   Objective:   Physical Exam:  There were no vitals taken for this visit.  Physical Exam Constitutional:      General: She is not in acute distress.    Appearance: She is well-developed.  Musculoskeletal:        General: No  deformity.  Neurological:     Mental Status: She is alert and oriented to person, place, and time.     Motor: No tremor.     Coordination: Coordination normal.     Gait: Gait normal.  Psychiatric:        Attention and Perception: Perception normal. She is attentive.        Mood and Affect: Mood is depressed. Mood is not anxious. Affect is not labile, blunt, angry or inappropriate.        Speech: Speech normal.        Behavior: Behavior normal.        Thought Content: Thought content normal. Thought content is not delusional. Thought content does not include homicidal or suicidal ideation. Thought content does not include suicidal plan.        Cognition and Memory: Cognition normal.        Judgment: Judgment normal.     Comments: Insight intact. No auditory or visual hallucinations. No delusions.  Talkative without pressure     Lab Review:     Component Value Date/Time   NA 141 05/14/2017 0917   K 4.4 05/14/2017 0917   CL 104 05/14/2017 0917   CO2 28 05/14/2017 0917   GLUCOSE 112 (H) 05/14/2017 0917   BUN 16 05/14/2017 0917   CREATININE 0.69 05/14/2017 0917   CALCIUM 9.3 05/14/2017 0917   GFRNONAA >60 05/14/2017 0917   GFRAA >60 05/14/2017 0917       Component Value Date/Time   WBC 6.3 05/14/2017 0917   RBC 4.58 05/14/2017 0917   HGB 14.2 05/14/2017 0917   HCT 41.7 05/14/2017 0917   PLT 235 05/14/2017 0917   MCV 91.0 05/14/2017  0917   MCH 31.0 05/14/2017 0917   MCHC 34.1 05/14/2017 0917   RDW 13.1 05/14/2017 0917   LYMPHSABS 1.4 05/14/2017 0917   MONOABS 0.5 05/14/2017 0917   EOSABS 0.2 05/14/2017 0917   BASOSABS 0.0 05/14/2017 0917    No results found for: "POCLITH", "LITHIUM"   No results found for: "PHENYTOIN", "PHENOBARB", "VALPROATE", "CBMZ"   .res Assessment: Plan:    Anchal was seen today for follow-up and depression.  Diagnoses and all orders for this visit:  Major depressive disorder, recurrent episode, moderate -     nortriptyline (PAMELOR) 25  MG capsule; 1 at night for 5 nights , then 2 at night for 5 nights, then 3 at night  Restless legs syndrome -     pramipexole (MIRAPEX) 1 MG tablet; 1/2 tablet in the AM and 1 at night   Greater than 50% of 50 min face to face time with patient was spent on counseling and coordination of care.  Recent severe depression in Jan 2023 getting better then worse.  But has had a number of bouts of depression in 2023.    Reduce Concerta to 36 mg daily for TRD and focus and attention. It helped when not currently depressed. Discussed potential benefits, risks, and side effects of stimulants with patient to include increased heart rate, palpitations, insomnia, increased anxiety, increased irritability, or decreased appetite.  Instructed patient to contact office if experiencing any significant tolerability issues.  Disc SE including rare risks of compulsive behaviors.  Extensive discussion of tx options including repeating TMS, Spravato, TCA.    Answered questions about reasons not to chose another sSRI, SNRI  Plan: DT NR reduce Concerta 36 AM,  Off label for TRD increase pramipexole to 0.5 mg AM and 1 mg HS Tapering TMS DC  AUVELITY Start TCA nortriptyline 25 up to 75 mg HS Extensive disc of alternative Spravato.  FU 6 weeks  After Visit Summary for patient specific instructions. Stop Auvelity Reduce Concerta to 1 in the AM Start nortriptyline 1 at night for 5 nights, then 2 at night for 5 nights then 3 at nigiht Increase pramipexole to 1/2 tablet in the AM and 1 tablet at night On or after 4/25 go in the morning to [facility] and get a blood test for nortriptyline. If no improvement by April 10 then call the office and we will increase the pramipexole.  Lynder Parents, MD, [facility]   No future appointments.   No orders of the defined types were placed in this encounter.     -------------------------------

## 2022-04-29 NOTE — Patient Instructions (Addendum)
Stop Auvelity Reduce Concerta to 1 in the AM Start nortriptyline 1 at night for 5 nights, then 2 at night for 5 nights then 3 at night On or after 4/25 go in the morning to [facility] and get a blood test for nortriptyline. Increase pramipexole to 1/2 tablet in the AM and 1 tablet at night If no improvement by April 10 then call the office and we will increase the pramipexole.

## 2022-05-07 ENCOUNTER — Other Ambulatory Visit: Payer: Self-pay | Admitting: Psychiatry

## 2022-05-07 DIAGNOSIS — F331 Major depressive disorder, recurrent, moderate: Secondary | ICD-10-CM

## 2022-05-08 ENCOUNTER — Telehealth: Payer: Self-pay | Admitting: Psychiatry

## 2022-05-08 ENCOUNTER — Other Ambulatory Visit: Payer: Self-pay | Admitting: Psychiatry

## 2022-05-08 DIAGNOSIS — G2581 Restless legs syndrome: Secondary | ICD-10-CM

## 2022-05-08 MED ORDER — ALPRAZOLAM 0.25 MG PO TABS: ORAL_TABLET | ORAL | 1 refills | Status: DC

## 2022-05-08 NOTE — Telephone Encounter (Signed)
Patient called to say she was no better on pramipexole and you said to let you know if no better by 4/10. She misread notes in AVS from last visit and got nortriptyline level drawn yesterday instead of on or after 4/25.

## 2022-05-08 NOTE — Telephone Encounter (Signed)
Pt requesting Rx Alprazolam [facility]. Out of town Friday

## 2022-05-08 NOTE — Telephone Encounter (Signed)
Pt called again reporting if not feeling better by 4/10 call office and will increase Pramipexole then.  RTC [phone]

## 2022-05-09 NOTE — Telephone Encounter (Signed)
Increase pramipexole to 1 mg BID if tolerated if she has SE then split the AM dose to 1/2 in the AM and noon and 1 at night.

## 2022-05-12 NOTE — Telephone Encounter (Signed)
Patient notified of recommendations. 

## 2022-05-22 ENCOUNTER — Other Ambulatory Visit: Payer: Self-pay | Admitting: Psychiatry

## 2022-05-22 DIAGNOSIS — F339 Major depressive disorder, recurrent, unspecified: Secondary | ICD-10-CM

## 2022-06-11 ENCOUNTER — Other Ambulatory Visit: Payer: Self-pay | Admitting: Psychiatry

## 2022-06-11 DIAGNOSIS — F339 Major depressive disorder, recurrent, unspecified: Secondary | ICD-10-CM

## 2022-06-11 NOTE — Telephone Encounter (Signed)
Nevermind , she has appt tomorrow.

## 2022-06-11 NOTE — Telephone Encounter (Signed)
Remind her to go get the blood test for the nortriptyline at [facility].

## 2022-06-12 ENCOUNTER — Encounter: Payer: Self-pay | Admitting: Psychiatry

## 2022-06-12 ENCOUNTER — Ambulatory Visit (INDEPENDENT_AMBULATORY_CARE_PROVIDER_SITE_OTHER): Payer: Medicare HMO | Admitting: Psychiatry

## 2022-06-12 DIAGNOSIS — G2581 Restless legs syndrome: Secondary | ICD-10-CM

## 2022-06-12 DIAGNOSIS — F339 Major depressive disorder, recurrent, unspecified: Secondary | ICD-10-CM | POA: Diagnosis not present

## 2022-06-12 DIAGNOSIS — F900 Attention-deficit hyperactivity disorder, predominantly inattentive type: Secondary | ICD-10-CM | POA: Diagnosis not present

## 2022-06-12 MED ORDER — ALPRAZOLAM 0.25 MG PO TABS: ORAL_TABLET | ORAL | 1 refills | Status: DC

## 2022-06-12 MED ORDER — METHYLPHENIDATE HCL ER (OSM) 36 MG PO TBCR
36.0000 mg | EXTENDED_RELEASE_TABLET | Freq: Every day | ORAL | 0 refills | Status: DC
Start: 2022-07-10 — End: 2022-07-23

## 2022-06-12 MED ORDER — METHYLPHENIDATE HCL ER (OSM) 36 MG PO TBCR
36.0000 mg | EXTENDED_RELEASE_TABLET | Freq: Every day | ORAL | 0 refills | Status: DC
Start: 2022-06-12 — End: 2022-07-23

## 2022-06-12 MED ORDER — PRAMIPEXOLE DIHYDROCHLORIDE 1 MG PO TABS: 1.0000 mg | ORAL_TABLET | Freq: Three times a day (TID) | ORAL | 1 refills | Status: DC

## 2022-06-12 MED ORDER — NORTRIPTYLINE HCL 25 MG PO CAPS
ORAL_CAPSULE | ORAL | 0 refills | Status: DC
Start: 2022-06-12 — End: 2022-08-18

## 2022-06-12 NOTE — Progress Notes (Signed)
Rachael Callahan [id number] December 11, 1945 77 y.o.  Subjective:   Patient ID:  Rachael Callahan is a 77 y.o. (DOB Nov 26, 1945) female.  Chief Complaint:  Chief Complaint  Patient presents with   Follow-up   Depression   ADD    Depression        Associated symptoms include no decreased concentration and no suicidal ideas.  Rachael Callahan presents to the office today for follow-up of TRD.  Prior addition of Concerta had helped with depression and productivity and interest and alertness.  visit August 2020.  She was doing generally well and no meds were changed.  03/2019 appt with the following noted: BP up here but reports it has been normal at home.   No new problems.  Still good On Concerta to 54 mg daily and duloxetine 120 mg since Jan 2020.  Has been doing well.  Overall the combination of meds is effective.  More productive and more interested and no longer depressed.  No longer napping.   Busy summer.  D in law brought 2 gkids from [place] and that was great.  GS 8 years older than adopted GD and they do well together. Thinks TMS helped some also. RLS managed usually with daily pramipexole. Xanax use a couple times per month. Often over concerns with grand-daughter's mother who causes problems. Minimal anxiety .  Sleep good and less napping.  Patient reports stable mood and denies depressed or irritable moods.  Patient denies any recent difficulty with anxiety.  Patient denies difficulty with sleep initiation or maintenance. Night owl and gets 7-8 hours.  No napping.   Denies appetite disturbance.  Patient reports that energy and motivation have been good.  Patient denies any difficulty with concentration.  Patient denies any suicidal ideation. Concerned about GD who's not doing well with psych treatment.  Had seen Rachael Callahan before but transferred.  GD Bipolar disorder dx.  [facility].  Also had hosp.  Plan:  No meds changed  10/11/19 appt with the following noted: Covid free.   Vaccinated. Pretty good mood. Satisfied with meds otherwise. Last week problems with RLS after being stable for awhile.  No more caffeine than usual. No SE. Patient reports stable mood and denies depressed or irritable moods.  Patient denies any recent difficulty with anxiety.  Patient denies difficulty with sleep initiation or maintenance. Denies appetite disturbance.  Patient reports that energy and motivation have been good.  Patient denies any difficulty with concentration.  Patient denies any suicidal ideation. Uses Xanax occ. [name] well. GD 26 and needs for insurance to see doc in [facility]. Plan: no changes  04/11/2020 appointment with the following noted: Since here concerta increased to 72 mg AM.  I think it's what I needed.  More motivated and energetic and productive but not normal. Still on duloxetine 120 without SE.   Mood good.  Patient reports stable mood and denies depressed or irritable moods.  Patient denies any recent difficulty with anxiety.  Patient denies difficulty with sleep initiation or maintenance, but night owl so sleep is messed up with retirement. Denies appetite disturbance.  Patient reports that energy and motivation have been good.  Patient denies any difficulty with concentration.  Patient denies any suicidal ideation. RLS managed.   Plan :  No med changes and she agrees.  10/11/2020 appointment with the following noted: Xanax 3-4 times per month. RLS managed with pramipexole and muscle relaxing cream. No sig depression.  Satisfied with meds.  Occ situational  mood problems. Patient reports stable mood and denies depressed or irritable moods.  Patient denies any recent difficulty with anxiety.  Patient denies difficulty with sleep initiation or maintenance. Denies appetite disturbance.  Patient reports that energy and motivation have been good.  Patient denies any difficulty with concentration.  Patient denies any suicidal ideation. Stress GD married last  year to cocaine addict. No SE Initial insomnia at night but can sleep better in the day. 1 cup AM coffee. Plan: No med changes  02/26/2021 phone call from husband and also discussed with patient.  MD note as follows: RTC  [phone] Rachael Callahan She's been in bed for a couple of days and doesn't feel life is worth living.  Not directly voiced SI.  Only like this for a couple of days.  She doesn't desire to go anywhere.  He knows of no trigger except ongoing family issues.  Disc with pt Felt bad for a couple of mos after virus in December.  Not Covid.  Cough ongoing.  Don't feel like doing anything.  No energy.  No interest.  Everything seems like too much to deal with.  No med changes. No SI but feels useless.  Seemed OK prior to virus. Change in Concerta recently about a week or 2 ago.  Plan: Would prefer something that could have a fairly quick effect and not have to change the Cymbalta given her history of doing well on it until recently. She did not respond to Abilify or Rexulti but she might respond to Vraylar 1.5 mg every morning.  It is quick-acting and compatible with her duloxetine and Concerta.  Disc discussed side effects.  She is not suicidal and does not need hospitalization at this time. Her husband will come pick up samples of Vraylar 1.5 mg 1 every morning starting tomorrow.  Lynder Parents, MD, [facility]  04/30/2021 appointment with the following noted: Several phone calls since she was here. Took Vryalar for a week and saw benefit witout SE and then stopped it. Felt fine for awhile.  Then had hard time last weekend and took another Vraylar bc felt weepy. Today feels fine with mood..  Generally low anxiety. Rare alprazolam. Taking generic Concerta without a problem. No SE. Not much dep in last 6 mos except as noted. GD pregnant with first [name].  Concerned about GD physical's and mental health. GD married.   GD sober. Plan: continue Concerta 72, duloxetine 120  05/21/21 took  Vraylar again for depression and felt better and rX sent.  Cost was a problem so PT assistance forms mailed to her.  07/12/21 TC complaining of sleepiness and relating it to [place].  Dose reduced to QOD  08/01/21 No show  10/03/21 TC complaining of depression. Sent Rx mirtazapine 30 mg HS.  Still taking Vraylar 1.5 QOD.  Was told to stop it since it was apparently no lonnger helping  12/10/21 appt noted:  seen with Rachael Callahan A lot of phone calls since here with depressive sx. Current psych meds: duloxetine 90, lithium 300, Ritalin 72 AM, mirtazapine 30. Depression worse in the last couple of week. SE lithium tremor and jittery.  Therefore needing alprazolam more often. Last free of dep about 6 weeks ago. Sleeping too much.  Sleep to escape depression.   Asks about Zoloft.   Plan: Plan: Stop mirtazapine and lithium Reduce duloxetine to 60 mg daily for 1 week then 30 mg daily for 1 week then stop duloxetine Start Auvelity 1 in the AM for 1  week then 1 twice daily as long as dizziness is not a problem.  02/24/22 appt noted: Several phone calls since she was here.  Jittery on [place] and stopped it. Referral sent for TMS. Then restarted Auvelity and wanted a prescription February 07, 2022 Taking Auvelity twice daily since here.   Meds: Auvelity BID (AM & HS), Concerta 72 AM, pramipexole 1 mg HS.  No SE Didn't notice anything profound coming off other meds but does feel better generally.  Doesn't feel worse.  Stressful family sensation. Feels a little odd sometimes like I'm not totally engaged and don't have energy she wisshes long term.  Depression is better but not gone. Sleep is good.  Appetite good. No sig RLS with pramipexole. Planning to move to where D lives  in [facility] [address] and build house. Enjoys theater and music. Plan: continue Auvelity BID, Concerta 36 AM, pramipexole 1 mg HS Still being evaluated for TMS  04/29/22 appt noted: Several phone calls since here with various  problems. Weaning TMS  and maybe somewhat helpful. Still so low energy.  Days of feeling sad withotu reason.  No recent crying spells-TMS helped.  Better motivation later in the day.  Not much interest or enjoyment.   No change off duloxetine and on Auvelity. No SE now.   Plan: Plan: DT NR reduce Concerta 36 AM,  Off label for TRD increase pramipexole to 0.5 mg AM and 1 mg HS Tapering TMS DC  AUVELITY Start TCA nortriptyline 25 up to 75 mg HS  05/08/22 TC no benefit pramipexole Plan:  Increase pramipexole to 1 mg BID if tolerated if she has SE then split the AM dose to 1/2 in the AM and noon and 1 at night.     06/12/22 appt noted: Meds; concerta 36 AM, nortriptyline 75 HS, pramipexole 1 mg BID. Finished TMS end of April Seemed to level out with meds.  Not severe downs or crying in the last couple of weeks.  Tire easily.  Motivation so so.  A little better interest and enjoyment.  Not required to do much. Dep 4/10.   No SE noted except dry mouth.  No N or dizziness.   Needs to lose wt.   No RLS.   Sleep ok.    Psychiatric medications tried include fluoxetine,  venlafaxine,  bupropion,  lithium,  Trintellix, duloxetine 120 + Concerta 54, Mirtazapine 30 without help for depression Auvelity NR pramipexole 1,  Latuda 40 mg, Rexulti 2 mg with no benefit, Vraylar 1.5 had benefit and then lost it.  TMS helped, 2019    Review of Systems:  Review of Systems  Cardiovascular:  Negative for chest pain and palpitations.  Musculoskeletal:  Positive for arthralgias.  Neurological:  Negative for dizziness and tremors.       RLS is temporarily worse.  Psychiatric/Behavioral:  Positive for dysphoric mood. Negative for agitation, behavioral problems, confusion, decreased concentration, hallucinations, self-injury, sleep disturbance and suicidal ideas. The patient is not nervous/anxious and is not hyperactive.     Medications: I have reviewed the patient's current medications.  Current  Outpatient Medications  Medication Sig Dispense Refill   Cholecalciferol 25 MCG (1000 UT) capsule Take by mouth.     losartan (COZAAR) 100 MG tablet Take 100 mg by mouth daily.  1   metoprolol succinate (TOPROL-XL) 50 MG 24 hr tablet Take 50 mg by mouth daily.     Multiple Vitamins-Minerals (MULTIVITAMIN ADULT PO) Take by mouth.     omeprazole (PRILOSEC) 40 MG  capsule Take 40 mg by mouth daily.  1   pravastatin (PRAVACHOL) 10 MG tablet Take 10 mg by mouth daily.  3   Probiotic Product (PROBIOTIC-10 PO) Take by mouth.     ALPRAZolam (XANAX) 0.25 MG tablet TAKE 1-2 TABLETS EVERYDAY AS NEEDED ANXIETY 30 tablet 1   methylphenidate 36 MG PO CR tablet Take 2 tablets (72 mg total) by mouth daily. (Patient not taking: Reported on 06/12/2022) 60 tablet 0   methylphenidate 36 MG PO CR tablet Take 1 tablet (36 mg total) by mouth daily. 30 tablet 0   [START ON 07/10/2022] methylphenidate 36 MG PO CR tablet Take 1 tablet (36 mg total) by mouth daily. 30 tablet 0   nortriptyline (PAMELOR) 25 MG capsule Take 3 capsules at night 270 capsule 0   pramipexole (MIRAPEX) 1 MG tablet Take 1 tablet (1 mg total) by mouth 3 (three) times daily. 90 tablet 1   No current facility-administered medications for this visit.    Medication Side Effects: None  Allergies: No Known Allergies  Past Medical History:  Diagnosis Date   Depression    High cholesterol    Hypertension    Migraine    Stomach ulcer     History reviewed. No pertinent family history.  Social History   Socioeconomic History   Marital status: Married    Spouse name: Not on file   Number of children: Not on file   Years of education: Not on file   Highest education level: Not on file  Occupational History   Not on file  Tobacco Use   Smoking status: Never   Smokeless tobacco: Never  Vaping Use   Vaping Use: Never used  Substance and Sexual Activity   Alcohol use: Yes    Comment: rarely   Drug use: Never   Sexual activity: Not on  file  Other Topics Concern   Not on file  Social History Narrative   Not on file   Social Determinants of Health   Financial Resource Strain: Not on file  Food Insecurity: Not on file  Transportation Needs: Not on file  Physical Activity: Not on file  Stress: Not on file  Social Connections: Not on file  Intimate Partner Violence: Not on file    Past Medical History, Surgical history, Social history, and Family history were reviewed and updated as appropriate.   Please see review of systems for further details on the patient's review from today.   Objective:   Physical Exam:  There were no vitals taken for this visit.  Physical Exam Constitutional:      General: She is not in acute distress.    Appearance: She is well-developed.  Musculoskeletal:        General: No deformity.  Neurological:     Mental Status: She is alert and oriented to person, place, and time.     Motor: No tremor.     Coordination: Coordination normal.     Gait: Gait normal.  Psychiatric:        Attention and Perception: Perception normal. She is attentive.        Mood and Affect: Mood is depressed. Mood is not anxious. Affect is blunt. Affect is not labile, angry or inappropriate.        Speech: Speech normal.        Behavior: Behavior normal.        Thought Content: Thought content normal. Thought content is not delusional. Thought content does not include homicidal or  suicidal ideation. Thought content does not include suicidal plan.        Cognition and Memory: Cognition normal.        Judgment: Judgment normal.     Comments: Insight intact. No auditory or visual hallucinations. No delusions.  Talkative without pressure      Lab Review:     Component Value Date/Time   NA 141 05/14/2017 0917   K 4.4 05/14/2017 0917   CL 104 05/14/2017 0917   CO2 28 05/14/2017 0917   GLUCOSE 112 (H) 05/14/2017 0917   BUN 16 05/14/2017 0917   CREATININE 0.69 05/14/2017 0917   CALCIUM 9.3 05/14/2017  0917   GFRNONAA >60 05/14/2017 0917   GFRAA >60 05/14/2017 0917       Component Value Date/Time   WBC 6.3 05/14/2017 0917   RBC 4.58 05/14/2017 0917   HGB 14.2 05/14/2017 0917   HCT 41.7 05/14/2017 0917   PLT 235 05/14/2017 0917   MCV 91.0 05/14/2017 0917   MCH 31.0 05/14/2017 0917   MCHC 34.1 05/14/2017 0917   RDW 13.1 05/14/2017 0917   LYMPHSABS 1.4 05/14/2017 0917   MONOABS 0.5 05/14/2017 0917   EOSABS 0.2 05/14/2017 0917   BASOSABS 0.0 05/14/2017 0917    No results found for: "POCLITH", "LITHIUM"   No results found for: "PHENYTOIN", "PHENOBARB", "VALPROATE", "CBMZ"   05/07/22 ONLY ON NORTRIPTYLINE A FEW DAYS: NORTRIP level 70  .res Assessment: Plan:    Binti was seen today for follow-up, depression and add.  Diagnoses and all orders for this visit:  Recurrent major depression resistant to treatment (HCC) -     pramipexole (MIRAPEX) 1 MG tablet; Take 1 tablet (1 mg total) by mouth 3 (three) times daily. -     nortriptyline (PAMELOR) 25 MG capsule; Take 3 capsules at night -     methylphenidate 36 MG PO CR tablet; Take 1 tablet (36 mg total) by mouth daily. -     methylphenidate 36 MG PO CR tablet; Take 1 tablet (36 mg total) by mouth daily.  Attention deficit hyperactivity disorder (ADHD), predominantly inattentive type  Restless legs syndrome -     pramipexole (MIRAPEX) 1 MG tablet; Take 1 tablet (1 mg total) by mouth 3 (three) times daily. -     ALPRAZolam (XANAX) 0.25 MG tablet; TAKE 1-2 TABLETS EVERYDAY AS NEEDED ANXIETY   Greater than 50% of 50 min face to face time with patient was spent on counseling and coordination of care.  Recent severe depression in Jan 2023 getting better then worse.  But has had a number of bouts of depression in 2023.   Overall seems to have high tolerance of meds.    Discussed potential benefits, risks, and side effects of stimulants with patient to include increased heart rate, palpitations, insomnia, increased anxiety,  increased irritability, or decreased appetite.  Instructed patient to contact office if experiencing any significant tolerability issues.  Disc SE including rare risks of compulsive behaviors.  Extensive discussion of tx options including repeating TMS, Spravato, TCA.    Answered questions about reasons not to chose another sSRI, SNRI  Disc SE each type of med including manic type sx.  Plan: continue Concerta 36 AM,  Off label for TRD increase pramipexole to 1 mg TID  nortriptyline 25 up to 75 mg HS Extensive disc of alternative Spravato.  Consider repeating nortriptyline level but this dose should have her in normal range.  So defer for now  FU 6 weeks  Meredith Staggers,  MD, DFAPA   No future appointments.   No orders of the defined types were placed in this encounter.     -------------------------------

## 2022-06-21 ENCOUNTER — Other Ambulatory Visit: Payer: Self-pay | Admitting: Psychiatry

## 2022-07-04 ENCOUNTER — Other Ambulatory Visit: Payer: Self-pay | Admitting: Psychiatry

## 2022-07-04 DIAGNOSIS — G2581 Restless legs syndrome: Secondary | ICD-10-CM

## 2022-07-04 DIAGNOSIS — F339 Major depressive disorder, recurrent, unspecified: Secondary | ICD-10-CM

## 2022-07-23 ENCOUNTER — Ambulatory Visit (INDEPENDENT_AMBULATORY_CARE_PROVIDER_SITE_OTHER): Payer: Medicare HMO | Admitting: Psychiatry

## 2022-07-23 ENCOUNTER — Encounter: Payer: Self-pay | Admitting: Psychiatry

## 2022-07-23 DIAGNOSIS — F339 Major depressive disorder, recurrent, unspecified: Secondary | ICD-10-CM | POA: Diagnosis not present

## 2022-07-23 DIAGNOSIS — G2581 Restless legs syndrome: Secondary | ICD-10-CM | POA: Diagnosis not present

## 2022-07-23 DIAGNOSIS — F900 Attention-deficit hyperactivity disorder, predominantly inattentive type: Secondary | ICD-10-CM | POA: Diagnosis not present

## 2022-07-23 MED ORDER — METHYLPHENIDATE HCL ER (OSM) 54 MG PO TBCR
54.0000 mg | EXTENDED_RELEASE_TABLET | Freq: Every day | ORAL | 0 refills | Status: DC
Start: 2022-07-23 — End: 2022-08-27

## 2022-07-23 MED ORDER — METHYLPHENIDATE HCL ER (OSM) 54 MG PO TBCR
54.0000 mg | EXTENDED_RELEASE_TABLET | Freq: Every day | ORAL | 0 refills | Status: DC
Start: 2022-08-20 — End: 2022-09-23

## 2022-07-23 MED ORDER — PRAMIPEXOLE DIHYDROCHLORIDE 1 MG PO TABS: 1.0000 mg | ORAL_TABLET | Freq: Four times a day (QID) | ORAL | 1 refills | Status: DC

## 2022-07-23 MED ORDER — METHYLPHENIDATE HCL ER (OSM) 54 MG PO TBCR
54.0000 mg | EXTENDED_RELEASE_TABLET | Freq: Every day | ORAL | 0 refills | Status: DC
Start: 2022-09-17 — End: 2022-08-27

## 2022-07-23 NOTE — Patient Instructions (Signed)
Check nortriptyline blood level at [facility]  increase pramipexole to 1 mg 4 times daily.   If No Response in 2 weeks then call and will increase to max 5 mg daily.

## 2022-07-23 NOTE — Progress Notes (Signed)
Rachael Callahan [id number] [birth year] 77 y.o.  Subjective:   Patient ID:  Rachael Callahan is a 77 y.o. (DOB [birth year]) female.  Chief Complaint:  Chief Complaint  Patient presents with   Follow-up   Depression   Fatigue    Depression        Associated symptoms include no decreased concentration and no suicidal ideas.  Rachael Callahan presents to the office today for follow-up of TRD.  Prior addition of Concerta had helped with depression and productivity and interest and alertness.  visit August 2020.  She was doing generally well and no meds were changed.  03/2019 appt with the following noted: BP up here but reports it has been normal at home.   No new problems.  Still good On Concerta to 54 mg daily and duloxetine 120 mg since Jan 2020.  Has been doing well.  Overall the combination of meds is effective.  More productive and more interested and no longer depressed.  No longer napping.   Busy summer.  D in law brought 2 gkids from [place] and that was great.  GS 8 years older than adopted GD and they do well together. Thinks TMS helped some also. RLS managed usually with daily pramipexole. Xanax use a couple times per month. Often over concerns with grand-daughter's mother who causes problems. Minimal anxiety .  Sleep good and less napping.  Patient reports stable mood and denies depressed or irritable moods.  Patient denies any recent difficulty with anxiety.  Patient denies difficulty with sleep initiation or maintenance. Night owl and gets 7-8 hours.  No napping.   Denies appetite disturbance.  Patient reports that energy and motivation have been good.  Patient denies any difficulty with concentration.  Patient denies any suicidal ideation. Concerned about GD who's not doing well with psych treatment.  Had seen [name] before but transferred.  GD Bipolar disorder dx.  Se[facility] at [facility].  Also had hosp.  Plan:  No meds changed  10/11/19 appt with the following noted: Covid  free.  Vaccinated. Pretty good mood. Satisfied with meds otherwise. Last week problems with RLS after being stable for awhile.  No more caffeine than usual. No SE. Patient reports stable mood and denies depressed or irritable moods.  Patient denies any recent difficulty with anxiety.  Patient denies difficulty with sleep initiation or maintenance. Denies appetite disturbance.  Patient reports that energy and motivation have been good.  Patient denies any difficulty with concentration.  Patient denies any suicidal ideation. Uses Xanax occ. [name] well. GD 26 and needs for insurance to see doc in [facility]. Plan: no changes  04/11/2020 appointment with the following noted: Since here concerta increased to 72 mg AM.  I think it's what I needed.  More motivated and energetic and productive but not normal. Still on duloxetine 120 without SE.   Mood good.  Patient reports stable mood and denies depressed or irritable moods.  Patient denies any recent difficulty with anxiety.  Patient denies difficulty with sleep initiation or maintenance, but night owl so sleep is messed up with retirement. Denies appetite disturbance.  Patient reports that energy and motivation have been good.  Patient denies any difficulty with concentration.  Patient denies any suicidal ideation. RLS managed.   Plan :  No med changes and she agrees.  10/11/2020 appointment with the following noted: Xanax 3-4 times per month. RLS managed with pramipexole and muscle relaxing cream. No sig depression.  Satisfied with meds.  Occ situational  mood problems. Patient reports stable mood and denies depressed or irritable moods.  Patient denies any recent difficulty with anxiety.  Patient denies difficulty with sleep initiation or maintenance. Denies appetite disturbance.  Patient reports that energy and motivation have been good.  Patient denies any difficulty with concentration.  Patient denies any suicidal ideation. Stress GD married  last year to cocaine addict. No SE Initial insomnia at night but can sleep better in the day. 1 cup AM coffee. Plan: No med changes  02/26/2021 phone call from husband and also discussed with patient.  MD note as follows: RTC  [id number] Rachael Callahan She's been in bed for a couple of days and doesn't feel life is worth living.  Not directly voiced SI.  Only like this for a couple of days.  She doesn't desire to go anywhere.  He knows of no trigger except ongoing family issues.  Disc with pt Felt bad for a couple of mos after virus in December.  Not Covid.  Cough ongoing.  Don't feel like doing anything.  No energy.  No interest.  Everything seems like too much to deal with.  No med changes. No SI but feels useless.  Seemed OK prior to virus. Change in Concerta recently about a week or 2 ago.  Plan: Would prefer something that could have a fairly quick effect and not have to change the Cymbalta given her history of doing well on it until recently. She did not respond to Abilify or Rexulti but she might respond to Vraylar 1.5 mg every morning.  It is quick-acting and compatible with her duloxetine and Concerta.  Disc discussed side effects.  She is not suicidal and does not need hospitalization at this time. Her husband will come pick up samples of Vraylar 1.5 mg 1 every morning starting tomorrow.  Meredith Staggers, MD, [facility]  04/30/2021 appointment with the following noted: Several phone calls since she was here. Took Vryalar for a week and saw benefit witout SE and then stopped it. Felt fine for awhile.  Then had hard time last weekend and took another Vraylar bc felt weepy. Today feels fine with mood..  Generally low anxiety. Rare alprazolam. Taking generic Concerta without a problem. No SE. Not much dep in last 6 mos except as noted. GD pregnant with first [name].  Concerned about GD physical's and mental health. GD married.   GD sober. Plan: continue Concerta 72, duloxetine 120  05/21/21  took Vraylar again for depression and felt better and rX sent.  Cost was a problem so PT assistance forms mailed to her.  07/12/21 TC complaining of sleepiness and relating it to Vraylar.  Dose reduced to QOD  08/01/21 No show  10/03/21 TC complaining of depression. Sent Rx mirtazapine 30 mg HS.  Still taking Vraylar 1.5 QOD.  Was told to stop it since it was apparently no lonnger helping  12/10/21 appt noted:  seen with Rachael Callahan A lot of phone calls since here with depressive sx. Current psych meds: duloxetine 90, lithium 300, Ritalin 72 AM, mirtazapine 30. Depression worse in the last couple of week. SE lithium tremor and jittery.  Therefore needing alprazolam more often. Last free of dep about 6 weeks ago. Sleeping too much.  Sleep to escape depression.   Asks about Zoloft.   Plan: Plan: Stop mirtazapine and lithium Reduce duloxetine to 60 mg daily for 1 week then 30 mg daily for 1 week then stop duloxetine Start Auvelity 1 in the AM for 1  week then 1 twice daily as long as dizziness is not a problem.  02/24/22 appt noted: Several phone calls since she was here.  Jittery on [place] and stopped it. Referral sent for TMS. Then restarted Auvelity and wanted a prescription February 07, 2022 Taking Auvelity twice daily since here.   Meds: Auvelity BID (AM & HS), Concerta 72 AM, pramipexole 1 mg HS.  No SE Didn't notice anything profound coming off other meds but does feel better generally.  Doesn't feel worse.  Stressful family sensation. Feels a little odd sometimes like I'm not totally engaged and don't have energy she wisshes long term.  Depression is better but not gone. Sleep is good.  Appetite good. No sig RLS with pramipexole. Planning to move to where D lives  in [facility] and build house. Enjoys theater and music. Plan: continue Auvelity BID, Concerta 36 AM, pramipexole 1 mg HS Still being evaluated for TMS  04/29/22 appt noted: Several phone calls since here with various  problems. Weaning TMS  and maybe somewhat helpful. Still so low energy.  Days of feeling sad withotu reason.  No recent crying spells-TMS helped.  Better motivation later in the day.  Not much interest or enjoyment.   No change off duloxetine and on Auvelity. No SE now.   Plan: Plan: DT NR reduce Concerta 36 AM,  Off label for TRD increase pramipexole to 0.5 mg AM and 1 mg HS Tapering TMS DC  AUVELITY Start TCA nortriptyline 25 up to 75 mg HS  05/08/22 TC no benefit pramipexole Plan:  Increase pramipexole to 1 mg BID if tolerated if she has SE then split the AM dose to 1/2 in the AM and noon and 1 at night.     06/12/22 appt noted: Meds; concerta 36 AM, nortriptyline 75 HS, pramipexole 1 mg BID. Finished TMS end of April Seemed to level out with meds.  Not severe downs or crying in the last couple of weeks.  Tire easily.  Motivation so so.  A little better interest and enjoyment.  Not required to do much. Dep 4/10.   No SE noted except dry mouth.  No N or dizziness.   Needs to lose wt.   No RLS.   Sleep ok.   Plan: continue Concerta 36 AM,  Off label for TRD increase pramipexole to 1 mg TID  nortriptyline 75 mg HS  07/23/22 appt noted: Finished TMS end of April Meds as above. No SE except dry mouth.  No sleepiness or N or revved up. No benefit with increase pramipexole.  Wants to feel better.  [place]hed[place].  Didn't enjoy beach trip and should have.  Occ crying spells.    Good relationship with D and son in law.  Moving to live with them in [place]  Looking forward to it. No RLS. Pending knee surgery 08/26/22 Sleep ok.    Psychiatric medications tried include fluoxetine,  venlafaxine,  bupropion,  lithium,  Trintellix, duloxetine 120 + Concerta 54, Mirtazapine 30 without help for depression Auvelity NR Nortriptyline 75   pramipexole 1,  Latuda 40 mg, Rexulti 2 mg with no benefit, Vraylar 1.5 had benefit and then lost it.  TMS helped, 2019    Review of Systems:   Review of Systems  Cardiovascular:  Negative for chest pain and palpitations.  Musculoskeletal:  Positive for arthralgias.  Neurological:  Negative for dizziness, tremors and weakness.       RLS off and on  Psychiatric/Behavioral:  Positive for dysphoric  mood. Negative for agitation, behavioral problems, confusion, decreased concentration, hallucinations, self-injury, sleep disturbance and suicidal ideas. The patient is not nervous/anxious and is not hyperactive.     Medications: I have reviewed the patient's current medications.  Current Outpatient Medications  Medication Sig Dispense Refill   ALPRAZolam (XANAX) 0.25 MG tablet TAKE 1-2 TABLETS EVERYDAY AS NEEDED ANXIETY 30 tablet 1   Cholecalciferol 25 MCG (1000 UT) capsule Take by mouth.     losartan (COZAAR) 100 MG tablet Take 100 mg by mouth daily.  1   metoprolol succinate (TOPROL-XL) 50 MG 24 hr tablet Take 50 mg by mouth daily.     Multiple Vitamins-Minerals (MULTIVITAMIN ADULT PO) Take by mouth.     nortriptyline (PAMELOR) 25 MG capsule Take 3 capsules at night 270 capsule 0   omeprazole (PRILOSEC) 40 MG capsule Take 40 mg by mouth daily.  1   pravastatin (PRAVACHOL) 10 MG tablet Take 10 mg by mouth daily.  3   Probiotic Product (PROBIOTIC-10 PO) Take by mouth.     [START ON 08/20/2022] methylphenidate 54 MG PO CR tablet Take 1 tablet (54 mg total) by mouth daily. 30 tablet 0   methylphenidate 54 MG PO CR tablet Take 1 tablet (54 mg total) by mouth daily. 30 tablet 0   [START ON 09/17/2022] methylphenidate 54 MG PO CR tablet Take 1 tablet (54 mg total) by mouth daily. 30 tablet 0   pramipexole (MIRAPEX) 1 MG tablet Take 1 tablet (1 mg total) by mouth in the morning, at noon, in the evening, and at bedtime. 120 tablet 1   No current facility-administered medications for this visit.    Medication Side Effects: None  Allergies: No Known Allergies  Past Medical History:  Diagnosis Date   Depression    High cholesterol     Hypertension    Migraine    Stomach ulcer     History reviewed. No pertinent family history.  Social History   Socioeconomic History   Marital status: Married    Spouse name: Not on file   Number of children: Not on file   Years of education: Not on file   Highest education level: Not on file  Occupational History   Not on file  Tobacco Use   Smoking status: Never   Smokeless tobacco: Never  Vaping Use   Vaping Use: Never used  Substance and Sexual Activity   Alcohol use: Yes    Comment: rarely   Drug use: Never   Sexual activity: Not on file  Other Topics Concern   Not on file  Social History Narrative   Not on file   Social Determinants of Health   Financial Resource Strain: Not on file  Food Insecurity: Not on file  Transportation Needs: Not on file  Physical Activity: Not on file  Stress: Not on file  Social Connections: Not on file  Intimate Partner Violence: Not on file    Past Medical History, Surgical history, Social history, and Family history were reviewed and updated as appropriate.   Please see review of systems for further details on the patient's review from today.   Objective:   Physical Exam:  There were no vitals taken for this visit.  Physical Exam Constitutional:      General: She is not in acute distress.    Appearance: She is well-developed.  Musculoskeletal:        General: No deformity.  Neurological:     Mental Status: She is alert and oriented  to person, place, and time.     Motor: No tremor.     Coordination: Coordination normal.     Gait: Gait normal.  Psychiatric:        Attention and Perception: Perception normal. She is attentive.        Mood and Affect: Mood is depressed. Mood is not anxious. Affect is blunt. Affect is not labile, angry or inappropriate.        Speech: Speech normal.        Behavior: Behavior normal.        Thought Content: Thought content normal. Thought content is not delusional. Thought content does  not include homicidal or suicidal ideation. Thought content does not include suicidal plan.        Cognition and Memory: Cognition normal.        Judgment: Judgment normal.     Comments: Insight intact. No auditory or visual hallucinations. No delusions.  Talkative without pressure No change in depression      Lab Review:     Component Value Date/Time   NA 141 05/14/2017 0917   K 4.4 05/14/2017 0917   CL 104 05/14/2017 0917   CO2 28 05/14/2017 0917   GLUCOSE 112 (H) 05/14/2017 0917   BUN 16 05/14/2017 0917   CREATININE 0.69 05/14/2017 0917   CALCIUM 9.3 05/14/2017 0917   GFRNONAA >60 05/14/2017 0917   GFRAA >60 05/14/2017 0917       Component Value Date/Time   WBC 6.3 05/14/2017 0917   RBC 4.58 05/14/2017 0917   HGB 14.2 05/14/2017 0917   HCT 41.7 05/14/2017 0917   PLT 235 05/14/2017 0917   MCV 91.0 05/14/2017 0917   MCH 31.0 05/14/2017 0917   MCHC 34.1 05/14/2017 0917   RDW 13.1 05/14/2017 0917   LYMPHSABS 1.4 05/14/2017 0917   MONOABS 0.5 05/14/2017 0917   EOSABS 0.2 05/14/2017 0917   BASOSABS 0.0 05/14/2017 0917    No results found for: "POCLITH", "LITHIUM"   No results found for: "PHENYTOIN", "PHENOBARB", "VALPROATE", "CBMZ"   05/07/22 ONLY ON NORTRIPTYLINE A FEW DAYS: NORTRIP level 70  .res Assessment: Plan:    Suzann was seen today for follow-up, depression and fatigue.  Diagnoses and all orders for this visit:  Recurrent major depression resistant to treatment (HCC) -     Nortriptyline (Aventyl), Serum [[facility]] -     methylphenidate 54 MG PO CR tablet; Take 1 tablet (54 mg total) by mouth daily. -     methylphenidate 54 MG PO CR tablet; Take 1 tablet (54 mg total) by mouth daily. -     methylphenidate 54 MG PO CR tablet; Take 1 tablet (54 mg total) by mouth daily. -     pramipexole (MIRAPEX) 1 MG tablet; Take 1 tablet (1 mg total) by mouth in the morning, at noon, in the evening, and at bedtime.  Attention deficit hyperactivity disorder (ADHD),  predominantly inattentive type  Restless legs syndrome -     pramipexole (MIRAPEX) 1 MG tablet; Take 1 tablet (1 mg total) by mouth in the morning, at noon, in the evening, and at bedtime.   30 min face to face time with patient was spent on counseling and coordination of care.  Recent severe depression in Jan 2023 getting better then worse.  But has had a number of bouts of depression in 2023.   Overall seems to have high tolerance of meds.    Discussed potential benefits, risks, and side effects of stimulants with patient to  include increased heart rate, palpitations, insomnia, increased anxiety, increased irritability, or decreased appetite.  Instructed patient to contact office if experiencing any significant tolerability issues.  Disc SE including rare risks of compulsive behaviors with pramipexole.  Disc dosing range from off label TRD studies 1-5 mg daily.    Extensive discussion of tx options including repeating TMS, Spravato, TCA.  , MAOI If plan fails then selegiline.   Disc MAOI restrictions.    Answered questions about reasons not to chose another sSRI, SNRI  Disc SE each type of med including manic type sx.  Plan: continue Concerta 36 AM,  nortriptyline 75 mg HS  Off label for TRD increase pramipexole to 1 mg QID DT NR or SE If NR in 2 weeks then call and will increase to max 5 mg daily. Extensive disc of alternative Spravato.  Check nortriptyline level .    FU 6 weeks  Meredith Staggers, MD, DFAPA   Future Appointments  Date Time Provider Department Center  08/13/2022  2:00 PM [facility] PAT 1 [facility] None  08/21/2022  1:00 PM Cottle, Steva Ready., MD [facility] None     Orders Placed This Encounter  Procedures   Nortriptyline (Aventyl), Serum [[facility]]      -------------------------------

## 2022-07-25 ENCOUNTER — Telehealth: Payer: Self-pay | Admitting: Psychiatry

## 2022-07-25 NOTE — Telephone Encounter (Signed)
Med list has methylphenidate 3 times, 1 for June, July, and August. Patient has had issues in the past with having 3 refills. Talked patient thru it and she is okay now.

## 2022-07-25 NOTE — Telephone Encounter (Signed)
Pt left message 5:04 pm 6/27. Requesting RTC @ [phone]. Really confused about her meds. Apt was 6/26

## 2022-08-12 NOTE — Progress Notes (Addendum)
COVID Vaccine Completed: yes  Date of COVID positive in last 90 days: no  PCP - [facility], Dr. Edward Jolly Cardiologist - n/a  Chest x-ray - n/a EKG - 08/13/22 Epic/chart Stress Test - years ago per pt ECHO - n/a Cardiac Cath - 15 years ago per pt was okay Pacemaker/ICD device last checked: n/a Spinal Cord Stimulator: n/a  Bowel Prep - no  Sleep Study - yes CPAP - no  Fasting Blood Sugar - preDM Checks Blood Sugar no checks at home  Last dose of GLP1 agonist-  N/A GLP1 instructions:  N/A   Last dose of SGLT-2 inhibitors-  N/A SGLT-2 instructions: N/A   Blood Thinner Instructions:  Time Aspirin Instructions: ASA 81, stopped few days ago Last Dose:  Activity level: Can go up a flight of stairs and perform activities of daily living without stopping and without symptoms of chest pain or shortness of breath.    Anesthesia review: ABN EKG  Patient denies shortness of breath, fever, cough and chest pain at PAT appointment  Patient verbalized understanding of instructions that were given to them at the PAT appointment. Patient was also instructed that they will need to review over the PAT instructions again at home before surgery.

## 2022-08-13 ENCOUNTER — Encounter (HOSPITAL_COMMUNITY): Payer: Self-pay

## 2022-08-13 ENCOUNTER — Encounter (HOSPITAL_COMMUNITY)
Admission: RE | Admit: 2022-08-13 | Discharge: 2022-08-13 | Disposition: A | Discharge status: Home / Self Care | Payer: Medicare HMO | Source: Ambulatory Visit | Attending: Orthopedic Surgery | Admitting: Orthopedic Surgery

## 2022-08-13 ENCOUNTER — Other Ambulatory Visit: Payer: Self-pay

## 2022-08-13 ENCOUNTER — Telehealth: Payer: Self-pay | Admitting: Psychiatry

## 2022-08-13 VITALS — BP 152/78 | HR 92 | Temp 98.7°F | Resp 16 | Ht 65.0 in | Wt 185.0 lb

## 2022-08-13 DIAGNOSIS — I10 Essential (primary) hypertension: Secondary | ICD-10-CM | POA: Diagnosis not present

## 2022-08-13 DIAGNOSIS — Z01818 Encounter for other preprocedural examination: Secondary | ICD-10-CM | POA: Diagnosis not present

## 2022-08-13 HISTORY — DX: Unspecified osteoarthritis, unspecified site: M19.90

## 2022-08-13 LAB — BASIC METABOLIC PANEL
Anion gap: 8 (ref 5–15)
BUN: 13 mg/dL (ref 8–23)
CO2: 26 mmol/L (ref 22–32)
Calcium: 9.6 mg/dL (ref 8.9–10.3)
Chloride: 106 mmol/L (ref 98–111)
Creatinine, Ser: 0.8 mg/dL (ref 0.44–1.00)
GFR, Estimated: >60 mL/min (ref >60)
Glucose, Bld: 109 mg/dL — ABNORMAL HIGH (ref 70–99)
Potassium: 4.1 mmol/L (ref 3.5–5.1)
Sodium: 140 mmol/L (ref 135–145)

## 2022-08-13 LAB — CBC
HCT: 42.6 % (ref 36.0–46.0)
Hemoglobin: 13.8 g/dL (ref 12.0–15.0)
MCH: 30.5 pg (ref 26.0–34.0)
MCHC: 32.4 g/dL (ref 30.0–36.0)
MCV: 94 fL (ref 80.0–100.0)
Platelets: 243 10*3/uL (ref 150–400)
RBC: 4.53 MIL/uL (ref 3.87–5.11)
RDW: 13.2 % (ref 11.5–15.5)
WBC: 7.2 10*3/uL (ref 4.0–10.5)
nRBC: 0 % (ref 0.0–0.2)

## 2022-08-13 LAB — SURGICAL PCR SCREEN
MRSA, PCR: NEGATIVE
Staphylococcus aureus: POSITIVE — AB

## 2022-08-13 NOTE — Telephone Encounter (Signed)
Pt  called at 5p.  She said she thinks the Pramipexole is helping. She would like to know if she can take one more a day and if so at what time of day.  Next appt 7/25

## 2022-08-13 NOTE — Patient Instructions (Addendum)
SURGICAL WAITING ROOM VISITATION  Patients having surgery or a procedure may have no more than 2 support people in the waiting area - these visitors may rotate.    Children under the age of 48 must have an adult with them who is not the patient.  Due to an increase in RSV and influenza rates and associated hospitalizations, children ages 69 and under may not visit patients in [facility] hospitals.  If the patient needs to stay at the hospital during part of their recovery, the visitor guidelines for inpatient rooms apply. Pre-op nurse will coordinate an appropriate time for 1 support person to accompany patient in pre-op.  This support person may not rotate.    Please refer to the [facility] website for the visitor guidelines for Inpatients (after your surgery is over and you are in a regular room).    Your procedure is scheduled on: 08/26/22   Report to [facility] Main Entrance    Report to admitting at 10:25 AM   Call this number if you have problems the morning of surgery [phone]   Do not eat food :After Midnight.   After Midnight you may have the following liquids until 9:55 AM DAY OF SURGERY  Water Non-Citrus Juices (without pulp, NO RED-Apple, White grape, White cranberry) Black Coffee (NO MILK/CREAM OR CREAMERS, sugar ok)  Clear Tea (NO MILK/CREAM OR CREAMERS, sugar ok) regular and decaf                             Plain Jell-O (NO RED)                                           Fruit ices (not with fruit pulp, NO RED)                                     Popsicles (NO RED)                                                               Sports drinks like Gatorade (NO RED)      The day of surgery:  Drink ONE (1) Pre-Surgery G2 at 9:55 AM the morning of surgery. Drink in one sitting. Do not sip.  This drink was given to you during your hospital  pre-op appointment visit. Nothing else to drink after completing the  Pre-Surgery G2.          If you have  questions, please contact your surgeon's office.   FOLLOW BOWEL PREP AND ANY ADDITIONAL PRE OP INSTRUCTIONS YOU RECEIVED FROM YOUR SURGEON'S OFFICE!!!     Oral Hygiene is also important to reduce your risk of infection.                                    Remember - BRUSH YOUR TEETH THE MORNING OF SURGERY WITH YOUR REGULAR TOOTHPASTE  DENTURES WILL BE REMOVED PRIOR TO SURGERY PLEASE DO NOT APPLY "Poly grip" OR ADHESIVES!!!  Take these medicines the morning of surgery with A SIP OF WATER: Alprazolam, Metoprolol, Omeprazole              You may not have any metal on your body including hair pins, jewelry, and body piercing             Do not wear make-up, lotions, powders, perfumes, or deodorant  Do not wear nail polish including gel and S&S, artificial/acrylic nails, or any other type of covering on natural nails including finger and toenails. If you have artificial nails, gel coating, etc. that needs to be removed by a nail salon please have this removed prior to surgery or surgery may need to be canceled/ delayed if the surgeon/ anesthesia feels like they are unable to be safely monitored.   Do not shave  48 hours prior to surgery.    Do not bring valuables to the hospital. [facility] IS NOT             RESPONSIBLE   FOR VALUABLES.   Contacts, glasses, dentures or bridgework may not be worn into surgery.   Bring small overnight bag day of surgery.   DO NOT BRING YOUR HOME MEDICATIONS TO THE HOSPITAL. PHARMACY WILL DISPENSE MEDICATIONS LISTED ON YOUR MEDICATION LIST TO YOU DURING YOUR ADMISSION IN THE HOSPITAL!   Special Instructions: Bring a copy of your healthcare power of attorney and living will documents the day of surgery if you haven't scanned them before.              Please read over the following fact sheets you were given: IF YOU HAVE QUESTIONS ABOUT YOUR PRE-OP INSTRUCTIONS PLEASE CALL [phone] Contras   If you received a COVID test during your pre-op visit  it is  requested that you wear a mask when out in public, stay away from anyone that may not be feeling well and notify your surgeon if you develop symptoms. If you test positive for Covid or have been in contact with anyone that has tested positive in the last 10 days please notify you surgeon.      Pre-operative 5 CHG Bath Instructions   You can play a key role in reducing the risk of infection after surgery. Your skin needs to be as free of germs as possible. You can reduce the number of germs on your skin by washing with CHG (chlorhexidine gluconate) soap before surgery. CHG is an antiseptic soap that kills germs and continues to kill germs even after washing.   DO NOT use if you have an allergy to chlorhexidine/CHG or antibacterial soaps. If your skin becomes reddened or irritated, stop using the CHG and notify one of our RNs at [phone].   Please shower with the CHG soap starting 4 days before surgery using the following schedule:     Please keep in mind the following:  DO NOT shave, including legs and underarms, starting the day of your first shower.   You may shave your face at any point before/day of surgery.  Place clean sheets on your bed the day you start using CHG soap. Use a clean washcloth (not used since being washed) for each shower. DO NOT sleep with pets once you start using the CHG.   CHG Shower Instructions:  If you choose to wash your hair and private area, wash first with your normal shampoo/soap.  After you use shampoo/soap, rinse your hair and body thoroughly to remove shampoo/soap residue.  Turn the water OFF and  apply about 3 tablespoons (45 ml) of CHG soap to a CLEAN washcloth.  Apply CHG soap ONLY FROM YOUR NECK DOWN TO YOUR TOES (washing for 3-5 minutes)  DO NOT use CHG soap on face, private areas, open wounds, or sores.  Pay special attention to the area where your surgery is being performed.  If you are having back surgery, having someone wash your back for  you may be helpful. Wait 2 minutes after CHG soap is applied, then you may rinse off the CHG soap.  Pat dry with a clean towel  Put on clean clothes/pajamas   If you choose to wear lotion, please use ONLY the CHG-compatible lotions on the back of this paper.     Additional instructions for the day of surgery: DO NOT APPLY any lotions, deodorants, cologne, or perfumes.   Put on clean/comfortable clothes.  Brush your teeth.  Ask your nurse before applying any prescription medications to the skin.      CHG Compatible Lotions   Aveeno Moisturizing lotion  Cetaphil Moisturizing Cream  Cetaphil Moisturizing Lotion  Clairol Herbal Essence Moisturizing Lotion, Dry Skin  Clairol Herbal Essence Moisturizing Lotion, Extra Dry Skin  Clairol Herbal Essence Moisturizing Lotion, Normal Skin  Curel Age Defying Therapeutic Moisturizing Lotion with Alpha Hydroxy  Curel Extreme Care Body Lotion  Curel Soothing Hands Moisturizing Hand Lotion  Curel Therapeutic Moisturizing Cream, Fragrance-Free  Curel Therapeutic Moisturizing Lotion, Fragrance-Free  Curel Therapeutic Moisturizing Lotion, Original Formula  Eucerin Daily Replenishing Lotion  Eucerin Dry Skin Therapy Plus Alpha Hydroxy Crme  Eucerin Dry Skin Therapy Plus Alpha Hydroxy Lotion  Eucerin Original Crme  Eucerin Original Lotion  Eucerin Plus Crme Eucerin Plus Lotion  Eucerin TriLipid Replenishing Lotion  Keri Anti-Bacterial Hand Lotion  Keri Deep Conditioning Original Lotion Dry Skin Formula Softly Scented  Keri Deep Conditioning Original Lotion, Fragrance Free Sensitive Skin Formula  Keri Lotion Fast Absorbing Fragrance Free Sensitive Skin Formula  Keri Lotion Fast Absorbing Softly Scented Dry Skin Formula  Keri Original Lotion  Keri Skin Renewal Lotion Keri Silky Smooth Lotion  Keri Silky Smooth Sensitive Skin Lotion  Nivea Body Creamy Conditioning Oil  Nivea Body Extra Enriched Lotion  Nivea Body Original Lotion  Nivea Body  Sheer Moisturizing Lotion Nivea Crme  Nivea Skin Firming Lotion  NutraDerm 30 Skin Lotion  NutraDerm Skin Lotion  NutraDerm Therapeutic Skin Cream  NutraDerm Therapeutic Skin Lotion  ProShield Protective Hand Cream  Provon moisturizing lotion   Incentive Spirometer  An incentive spirometer is a tool that can help keep your lungs clear and active. This tool measures how well you are filling your lungs with each breath. Taking long deep breaths may help reverse or decrease the chance of developing breathing (pulmonary) problems (especially infection) following: A long period of time when you are unable to move or be active. BEFORE THE PROCEDURE  If the spirometer includes an indicator to show your best effort, your nurse or respiratory therapist will set it to a desired goal. If possible, sit up straight or lean slightly forward. Try not to slouch. Hold the incentive spirometer in an upright position. INSTRUCTIONS FOR USE  Sit on the edge of your bed if possible, or sit up as far as you can in bed or on a chair. Hold the incentive spirometer in an upright position. Breathe out normally. Place the mouthpiece in your mouth and seal your lips tightly around it. Breathe in slowly and as deeply as possible, raising the piston or the ball  toward the top of the column. Hold your breath for 3-5 seconds or for as long as possible. Allow the piston or ball to fall to the bottom of the column. Remove the mouthpiece from your mouth and breathe out normally. Rest for a few seconds and repeat Steps 1 through 7 at least 10 times every 1-2 hours when you are awake. Take your time and take a few normal breaths between deep breaths. The spirometer may include an indicator to show your best effort. Use the indicator as a goal to work toward during each repetition. After each set of 10 deep breaths, practice coughing to be sure your lungs are clear. If you have an incision (the cut made at the time of  surgery), support your incision when coughing by placing a pillow or rolled up towels firmly against it. Once you are able to get out of bed, walk around indoors and cough well. You may stop using the incentive spirometer when instructed by your caregiver.  RISKS AND COMPLICATIONS Take your time so you do not get dizzy or light-headed. If you are in pain, you may need to take or ask for pain medication before doing incentive spirometry. It is harder to take a deep breath if you are having pain. AFTER USE Rest and breathe slowly and easily. It can be helpful to keep track of a log of your progress. Your caregiver can provide you with a simple table to help with this. If you are using the spirometer at home, follow these instructions: SEEK MEDICAL CARE IF:  You are having difficultly using the spirometer. You have trouble using the spirometer as often as instructed. Your pain medication is not giving enough relief while using the spirometer. You develop fever of 100.5 F (38.1 C) or higher. SEEK IMMEDIATE MEDICAL CARE IF:  You cough up bloody sputum that had not been present before. You develop fever of 102 F (38.9 C) or greater. You develop worsening pain at or near the incision site. MAKE SURE YOU:  Understand these instructions. Will watch your condition. Will get help right away if you are not doing well or get worse. Document Released: 05/26/2006 Document Revised: 04/07/2011 Document Reviewed: 07/27/2006 [facility] Patient Information 2014 [place], [place].   ________________________________________________________________________

## 2022-08-14 NOTE — Telephone Encounter (Signed)
Patient has been prescribed 4 times a day already. She was not aware.

## 2022-08-14 NOTE — Progress Notes (Signed)
[facility]+ results routed to Dr. Olin. 

## 2022-08-18 ENCOUNTER — Other Ambulatory Visit: Payer: Self-pay | Admitting: Psychiatry

## 2022-08-18 DIAGNOSIS — F339 Major depressive disorder, recurrent, unspecified: Secondary | ICD-10-CM

## 2022-08-18 DIAGNOSIS — G2581 Restless legs syndrome: Secondary | ICD-10-CM

## 2022-08-21 ENCOUNTER — Ambulatory Visit: Payer: Medicare HMO | Admitting: Psychiatry

## 2022-08-21 ENCOUNTER — Telehealth: Payer: Self-pay | Admitting: Psychiatry

## 2022-08-21 NOTE — Telephone Encounter (Signed)
Provider out apt need RS put on canc list. Pt stated apt for med change and going well. Has apt 8/27.

## 2022-08-21 NOTE — Telephone Encounter (Signed)
LVM to RC 

## 2022-08-21 NOTE — Telephone Encounter (Signed)
Patient called back and left a message that she was doing well.

## 2022-08-25 NOTE — H&P (Signed)
TOTAL KNEE ADMISSION H&P  Patient is being admitted for right total knee arthroplasty.  Therapy Plans: outpatient therapy at [facility] in [facility] Disposition: Home with husband Planned DVT Prophylaxis: aspirin 81mg  BID DME needed: walker PCP: Dr. Edward Jolly - clearance noted on office visit from 6/27 TXA: IV Allergies: NKDA Anesthesia Concerns: none BMI: 32.6 Last HgbA1c: Not diabetic   Other: - ice machine at hospital - No hx of VTE or cancer - No hx of stomach ulcer or GI bleed - oxycodone, robaxin, tylenol  Subjective:  Chief Complaint:right knee pain.  HPI: Rachael Callahan, 77 y.o. female, has a history of pain and functional disability in the right knee due to arthritis and has failed non-surgical conservative treatments for greater than 12 weeks to includeNSAID's and/or analgesics, corticosteriod injections, and activity modification.  Onset of symptoms was gradual, starting 2 years ago with gradually worsening course since that time. The patient noted no past surgery on the right knee(s).  Patient currently rates pain in the right knee(s) at 7 out of 10 with activity. Patient has worsening of pain with activity and weight bearing, pain that interferes with activities of daily living, and pain with passive range of motion.  Patient has evidence of joint space narrowing by imaging studies. There is no active infection.  There are no problems to display for this patient.  Past Medical History:  Diagnosis Date   Arthritis    Depression    High cholesterol    Hypertension    Migraine    Stomach ulcer     Past Surgical History:  Procedure Laterality Date   ABDOMINAL HYSTERECTOMY     CARPAL TUNNEL RELEASE Bilateral    ROTATOR CUFF REPAIR Right    TRIGGER FINGER RELEASE      No current facility-administered medications for this encounter.   Current Outpatient Medications  Medication Sig Dispense Refill Last Dose   ALPRAZolam (XANAX) 0.25 MG tablet TAKE 1-2 TABLETS EVERYDAY  AS NEEDED ANXIETY 30 tablet 1    aspirin EC 81 MG tablet Take 81 mg by mouth daily. Swallow whole.      Cholecalciferol 125 MCG (5000 UT) TABS Take 5,000 Units by mouth daily.      methylphenidate 54 MG PO CR tablet Take 1 tablet (54 mg total) by mouth daily. 30 tablet 0    metoprolol succinate (TOPROL-XL) 50 MG 24 hr tablet Take 50 mg by mouth daily.      Multiple Vitamins-Minerals (MULTIVITAMIN ADULT PO) Take 1 tablet by mouth daily.      olmesartan (BENICAR) 40 MG tablet Take 40 mg by mouth daily.      omeprazole (PRILOSEC) 40 MG capsule Take 40 mg by mouth daily.  1    pravastatin (PRAVACHOL) 20 MG tablet Take 20 mg by mouth at bedtime.  3    Probiotic Product (PROBIOTIC-10 PO) Take 1 tablet by mouth daily.      methylphenidate 54 MG PO CR tablet Take 1 tablet (54 mg total) by mouth daily. 30 tablet 0    [START ON 09/17/2022] methylphenidate 54 MG PO CR tablet Take 1 tablet (54 mg total) by mouth daily. 30 tablet 0    nortriptyline (PAMELOR) 25 MG capsule TAKE 3 CAPSULES AT NIGHT 270 capsule 0    pramipexole (MIRAPEX) 1 MG tablet Take 1 tablet (1 mg total) by mouth 4 (four) times daily. 120 tablet 1    No Known Allergies  Social History   Tobacco Use   Smoking status: Never  Smokeless tobacco: Never  Substance Use Topics   Alcohol use: Yes    Comment: rarely    No family history on file.   Review of Systems  Constitutional:  Negative for chills and fever.  Respiratory:  Negative for cough and shortness of breath.   Cardiovascular:  Negative for chest pain.  Gastrointestinal:  Negative for nausea and vomiting.  Musculoskeletal:  Positive for arthralgias.     Objective:  Physical Exam Well nourished and well developed. General: Alert and oriented x3, cooperative and pleasant, no acute distress. Head: normocephalic, atraumatic, neck supple. Eyes: EOMI.  Musculoskeletal: Right knee exam: No significant palpable effusion, warmth erythema She does have a slight valgus  right knee compared to her left knee with more neutral Tenderness over the lateral aspect the knee more so than medially Stable medial and lateral collateral ligaments with some pain with correction of her valgus Right hip exam reveals no groin pain or referred pain Left knee does not reveal significant palpable effusion or significant tenderness laterally as compared to the right  Calves soft and nontender. Motor function intact in LE. Strength 5/5 LE bilaterally. Neuro: Distal pulses 2+. Sensation to light touch intact in LE.  Vital signs in last 24 hours:    Labs:   Estimated body mass index is 30.79 kg/m as calculated from the following:   Height as of 08/13/22: 5\' 5"  (1.651 m).   Weight as of 08/13/22: 83.9 kg.   Imaging Review Plain radiographs demonstrate severe degenerative joint disease of the right knee(s). The overall alignment isneutral. The bone quality appears to be adequate for age and reported activity level.      Assessment/Plan:  End stage arthritis, right knee   The patient history, physical examination, clinical judgment of the provider and imaging studies are consistent with end stage degenerative joint disease of the right knee(s) and total knee arthroplasty is deemed medically necessary. The treatment options including medical management, injection therapy arthroscopy and arthroplasty were discussed at length. The risks and benefits of total knee arthroplasty were presented and reviewed. The risks due to aseptic loosening, infection, stiffness, patella tracking problems, thromboembolic complications and other imponderables were discussed. The patient acknowledged the explanation, agreed to proceed with the plan and consent was signed. Patient is being admitted for inpatient treatment for surgery, pain control, PT, OT, prophylactic antibiotics, VTE prophylaxis, progressive ambulation and ADL's and discharge planning. The patient is planning to be discharged   home.     Patient's anticipated LOS is less than 2 midnights, meeting these requirements: - Younger than 42 - Lives within 1 hour of care - Has a competent adult at home to recover with post-op recover - NO history of  - Chronic pain requiring opiods  - Diabetes  - Coronary Artery Disease  - Heart failure  - Heart attack  - Stroke  - DVT/VTE  - Cardiac arrhythmia  - Respiratory Failure/COPD  - Renal failure  - Anemia  - Advanced Liver disease

## 2022-08-26 ENCOUNTER — Other Ambulatory Visit: Payer: Self-pay

## 2022-08-26 ENCOUNTER — Ambulatory Visit (HOSPITAL_COMMUNITY): Payer: Medicare HMO | Admitting: Physician Assistant

## 2022-08-26 ENCOUNTER — Observation Stay (HOSPITAL_COMMUNITY)
Admission: RE | Admit: 2022-08-26 | Discharge: 2022-08-27 | Disposition: A | Discharge status: Home / Self Care | Payer: Medicare HMO | Attending: Orthopedic Surgery | Admitting: Orthopedic Surgery

## 2022-08-26 ENCOUNTER — Ambulatory Visit (HOSPITAL_BASED_OUTPATIENT_CLINIC_OR_DEPARTMENT_OTHER): Payer: Medicare HMO | Admitting: Anesthesiology

## 2022-08-26 ENCOUNTER — Encounter (HOSPITAL_COMMUNITY)
Admission: RE | Disposition: A | Discharge status: Home / Self Care | Payer: Self-pay | Source: Home / Self Care | Attending: Orthopedic Surgery

## 2022-08-26 ENCOUNTER — Encounter (HOSPITAL_COMMUNITY): Payer: Self-pay | Admitting: Orthopedic Surgery

## 2022-08-26 DIAGNOSIS — M1711 Unilateral primary osteoarthritis, right knee: Secondary | ICD-10-CM | POA: Diagnosis not present

## 2022-08-26 DIAGNOSIS — Z79899 Other long term (current) drug therapy: Secondary | ICD-10-CM | POA: Diagnosis not present

## 2022-08-26 DIAGNOSIS — Z7982 Long term (current) use of aspirin: Secondary | ICD-10-CM | POA: Diagnosis not present

## 2022-08-26 DIAGNOSIS — I10 Essential (primary) hypertension: Secondary | ICD-10-CM | POA: Insufficient documentation

## 2022-08-26 DIAGNOSIS — Z96651 Presence of right artificial knee joint: Principal | ICD-10-CM

## 2022-08-26 HISTORY — PX: TOTAL KNEE ARTHROPLASTY: SHX125

## 2022-08-26 SURGERY — ARTHROPLASTY, KNEE, TOTAL
Anesthesia: Spinal | Site: Knee | Laterality: Right

## 2022-08-26 MED ORDER — ROPIVACAINE HCL 5 MG/ML IJ SOLN
INTRAMUSCULAR | Status: DC | PRN
  Administered 2022-08-26: 20 mL via PERINEURAL

## 2022-08-26 MED ORDER — METOCLOPRAMIDE HCL 5 MG/ML IJ SOLN: 5.0000 mg | Freq: Three times a day (TID) | INTRAMUSCULAR | Status: DC | PRN

## 2022-08-26 MED ORDER — MIDAZOLAM HCL 2 MG/2ML IJ SOLN: 2.0000 mg | INTRAMUSCULAR | Status: DC

## 2022-08-26 MED ORDER — DEXAMETHASONE SODIUM PHOSPHATE 10 MG/ML IJ SOLN
10.0000 mg | Freq: Once | INTRAMUSCULAR | Status: AC
  Administered 2022-08-27: 10 mg via INTRAVENOUS
  Filled 2022-08-26: qty 1

## 2022-08-26 MED ORDER — ONDANSETRON HCL 4 MG/2ML IJ SOLN: 4.0000 mg | Freq: Four times a day (QID) | INTRAMUSCULAR | Status: DC | PRN

## 2022-08-26 MED ORDER — OXYCODONE HCL 5 MG PO TABS: 5.0000 mg | ORAL_TABLET | Freq: Once | ORAL | Status: DC | PRN

## 2022-08-26 MED ORDER — OXYCODONE HCL 5 MG/5ML PO SOLN: 5.0000 mg | Freq: Once | ORAL | Status: DC | PRN

## 2022-08-26 MED ORDER — IRBESARTAN 150 MG PO TABS
300.0000 mg | ORAL_TABLET | Freq: Every day | ORAL | Status: DC
  Administered 2022-08-27: 300 mg via ORAL
  Filled 2022-08-26: qty 2

## 2022-08-26 MED ORDER — METHYLPHENIDATE HCL ER (OSM) 27 MG PO TBCR: 54.0000 mg | EXTENDED_RELEASE_TABLET | Freq: Every day | ORAL | Status: DC

## 2022-08-26 MED ORDER — PROPOFOL 500 MG/50ML IV EMUL
INTRAVENOUS | Status: DC | PRN
  Administered 2022-08-26: 100 ug/kg/min via INTRAVENOUS

## 2022-08-26 MED ORDER — BUPIVACAINE HCL (PF) 0.25 % IJ SOLN
INTRAMUSCULAR | Status: AC
  Filled 2022-08-26: qty 30

## 2022-08-26 MED ORDER — PHENYLEPHRINE HCL (PRESSORS) 10 MG/ML IV SOLN
INTRAVENOUS | Status: AC
  Filled 2022-08-26: qty 1

## 2022-08-26 MED ORDER — METHOCARBAMOL 500 MG IVPB - SIMPLE MED: 500.0000 mg | Freq: Four times a day (QID) | INTRAVENOUS | Status: DC | PRN

## 2022-08-26 MED ORDER — 0.9 % SODIUM CHLORIDE (POUR BTL) OPTIME
TOPICAL | Status: DC | PRN
  Administered 2022-08-26: 1000 mL

## 2022-08-26 MED ORDER — DEXAMETHASONE SODIUM PHOSPHATE 10 MG/ML IJ SOLN
INTRAMUSCULAR | Status: AC
  Filled 2022-08-26: qty 1

## 2022-08-26 MED ORDER — METOCLOPRAMIDE HCL 5 MG PO TABS: 5.0000 mg | ORAL_TABLET | Freq: Three times a day (TID) | ORAL | Status: DC | PRN

## 2022-08-26 MED ORDER — SODIUM CHLORIDE (PF) 0.9 % IJ SOLN
INTRAMUSCULAR | Status: DC | PRN
  Administered 2022-08-26: 30 mL

## 2022-08-26 MED ORDER — SODIUM CHLORIDE (PF) 0.9 % IJ SOLN
INTRAMUSCULAR | Status: AC
  Filled 2022-08-26: qty 30

## 2022-08-26 MED ORDER — ALPRAZOLAM 0.25 MG PO TABS: 0.2500 mg | ORAL_TABLET | Freq: Every day | ORAL | Status: DC | PRN

## 2022-08-26 MED ORDER — BUPIVACAINE IN DEXTROSE 0.75-8.25 % IT SOLN
INTRATHECAL | Status: DC | PRN
Start: 2022-08-26 — End: 2022-08-26
  Administered 2022-08-26: 1.6 mL via INTRATHECAL

## 2022-08-26 MED ORDER — POLYETHYLENE GLYCOL 3350 17 G PO PACK
17.0000 g | PACK | Freq: Two times a day (BID) | ORAL | Status: DC
  Administered 2022-08-27: 17 g via ORAL
  Filled 2022-08-26 (×2): qty 1

## 2022-08-26 MED ORDER — HYDROMORPHONE HCL 1 MG/ML IJ SOLN: 0.2500 mg | INTRAMUSCULAR | Status: DC | PRN

## 2022-08-26 MED ORDER — STERILE WATER FOR IRRIGATION IR SOLN
Status: DC | PRN
  Administered 2022-08-26: 2000 mL

## 2022-08-26 MED ORDER — DIPHENHYDRAMINE HCL 12.5 MG/5ML PO ELIX: 12.5000 mg | ORAL_SOLUTION | ORAL | Status: DC | PRN

## 2022-08-26 MED ORDER — LACTATED RINGERS IV SOLN: INTRAVENOUS | Status: DC

## 2022-08-26 MED ORDER — KETOROLAC TROMETHAMINE 30 MG/ML IJ SOLN
INTRAMUSCULAR | Status: DC | PRN
  Administered 2022-08-26: 30 mg

## 2022-08-26 MED ORDER — FENTANYL CITRATE PF 50 MCG/ML IJ SOSY
100.0000 ug | PREFILLED_SYRINGE | INTRAMUSCULAR | Status: AC
  Administered 2022-08-26: 50 ug via INTRAVENOUS
  Filled 2022-08-26: qty 2

## 2022-08-26 MED ORDER — POVIDONE-IODINE 10 % EX SWAB: 2.0000 | Freq: Once | CUTANEOUS | Status: DC

## 2022-08-26 MED ORDER — CEFAZOLIN SODIUM-DEXTROSE 2-4 GM/100ML-% IV SOLN
2.0000 g | INTRAVENOUS | Status: AC
  Administered 2022-08-26: 2 g via INTRAVENOUS
  Filled 2022-08-26: qty 100

## 2022-08-26 MED ORDER — OXYCODONE HCL 5 MG PO TABS
5.0000 mg | ORAL_TABLET | ORAL | Status: DC | PRN
  Administered 2022-08-26 – 2022-08-27 (×3): 5 mg via ORAL
  Filled 2022-08-26 (×3): qty 1

## 2022-08-26 MED ORDER — HYDROMORPHONE HCL 1 MG/ML IJ SOLN: 0.5000 mg | INTRAMUSCULAR | Status: DC | PRN

## 2022-08-26 MED ORDER — CLONIDINE HCL (ANALGESIA) 100 MCG/ML EP SOLN
EPIDURAL | Status: DC | PRN
  Administered 2022-08-26: 100 ug

## 2022-08-26 MED ORDER — EPINEPHRINE PF 1 MG/ML IJ SOLN
INTRAMUSCULAR | Status: AC
  Filled 2022-08-26: qty 1

## 2022-08-26 MED ORDER — ONDANSETRON HCL 4 MG/2ML IJ SOLN
INTRAMUSCULAR | Status: DC | PRN
  Administered 2022-08-26: 4 mg via INTRAVENOUS

## 2022-08-26 MED ORDER — OXYCODONE HCL 5 MG PO TABS: 10.0000 mg | ORAL_TABLET | ORAL | Status: DC | PRN

## 2022-08-26 MED ORDER — DOCUSATE SODIUM 100 MG PO CAPS
100.0000 mg | ORAL_CAPSULE | Freq: Two times a day (BID) | ORAL | Status: DC
  Administered 2022-08-26 – 2022-08-27 (×2): 100 mg via ORAL
  Filled 2022-08-26 (×2): qty 1

## 2022-08-26 MED ORDER — PROPOFOL 1000 MG/100ML IV EMUL
INTRAVENOUS | Status: AC
  Filled 2022-08-26: qty 100

## 2022-08-26 MED ORDER — ONDANSETRON HCL 4 MG PO TABS: 4.0000 mg | ORAL_TABLET | Freq: Four times a day (QID) | ORAL | Status: DC | PRN

## 2022-08-26 MED ORDER — TRANEXAMIC ACID-NACL 1000-0.7 MG/100ML-% IV SOLN
1000.0000 mg | Freq: Once | INTRAVENOUS | Status: AC
  Administered 2022-08-26: 1000 mg via INTRAVENOUS
  Filled 2022-08-26: qty 100

## 2022-08-26 MED ORDER — CEFAZOLIN SODIUM-DEXTROSE 2-4 GM/100ML-% IV SOLN
2.0000 g | Freq: Four times a day (QID) | INTRAVENOUS | Status: AC
  Administered 2022-08-26 – 2022-08-27 (×2): 2 g via INTRAVENOUS
  Filled 2022-08-26 (×2): qty 100

## 2022-08-26 MED ORDER — ASPIRIN 81 MG PO CHEW
81.0000 mg | CHEWABLE_TABLET | Freq: Two times a day (BID) | ORAL | Status: DC
  Administered 2022-08-26 – 2022-08-27 (×2): 81 mg via ORAL
  Filled 2022-08-26 (×2): qty 1

## 2022-08-26 MED ORDER — BUPIVACAINE-EPINEPHRINE (PF) 0.25% -1:200000 IJ SOLN
INTRAMUSCULAR | Status: DC | PRN
  Administered 2022-08-26: 30 mL

## 2022-08-26 MED ORDER — DEXAMETHASONE SODIUM PHOSPHATE 10 MG/ML IJ SOLN
8.0000 mg | Freq: Once | INTRAMUSCULAR | Status: AC
  Administered 2022-08-26: 8 mg via INTRAVENOUS

## 2022-08-26 MED ORDER — PRAMIPEXOLE DIHYDROCHLORIDE 0.25 MG PO TABS
1.0000 mg | ORAL_TABLET | Freq: Four times a day (QID) | ORAL | Status: DC
  Administered 2022-08-26 – 2022-08-27 (×3): 1 mg via ORAL
  Filled 2022-08-26 (×3): qty 4

## 2022-08-26 MED ORDER — PHENOL 1.4 % MT LIQD: 1.0000 | OROMUCOSAL | Status: DC | PRN

## 2022-08-26 MED ORDER — METHOCARBAMOL 500 MG PO TABS: 500.0000 mg | ORAL_TABLET | Freq: Four times a day (QID) | ORAL | Status: DC | PRN

## 2022-08-26 MED ORDER — KETOROLAC TROMETHAMINE 30 MG/ML IJ SOLN
INTRAMUSCULAR | Status: AC
  Filled 2022-08-26: qty 1

## 2022-08-26 MED ORDER — ONDANSETRON HCL 4 MG/2ML IJ SOLN: 4.0000 mg | Freq: Once | INTRAMUSCULAR | Status: DC | PRN

## 2022-08-26 MED ORDER — ACETAMINOPHEN 500 MG PO TABS
1000.0000 mg | ORAL_TABLET | Freq: Four times a day (QID) | ORAL | Status: DC
  Administered 2022-08-26 – 2022-08-27 (×4): 1000 mg via ORAL
  Filled 2022-08-26 (×4): qty 2

## 2022-08-26 MED ORDER — MENTHOL 3 MG MT LOZG: 1.0000 | LOZENGE | OROMUCOSAL | Status: DC | PRN

## 2022-08-26 MED ORDER — PHENYLEPHRINE HCL-NACL 20-0.9 MG/250ML-% IV SOLN
INTRAVENOUS | Status: DC | PRN
  Administered 2022-08-26: 50 ug/min via INTRAVENOUS

## 2022-08-26 MED ORDER — METOPROLOL SUCCINATE ER 50 MG PO TB24
50.0000 mg | ORAL_TABLET | Freq: Every day | ORAL | Status: DC
  Administered 2022-08-27: 50 mg via ORAL
  Filled 2022-08-26: qty 1

## 2022-08-26 MED ORDER — ORAL CARE MOUTH RINSE: 15.0000 mL | Freq: Once | OROMUCOSAL | Status: AC

## 2022-08-26 MED ORDER — PRAVASTATIN SODIUM 20 MG PO TABS
20.0000 mg | ORAL_TABLET | Freq: Every day | ORAL | Status: DC
  Administered 2022-08-26: 20 mg via ORAL
  Filled 2022-08-26: qty 1

## 2022-08-26 MED ORDER — PANTOPRAZOLE SODIUM 40 MG PO TBEC
40.0000 mg | DELAYED_RELEASE_TABLET | Freq: Every day | ORAL | Status: DC
  Administered 2022-08-27: 40 mg via ORAL
  Filled 2022-08-26: qty 1

## 2022-08-26 MED ORDER — CHLORHEXIDINE GLUCONATE 0.12 % MT SOLN
15.0000 mL | Freq: Once | OROMUCOSAL | Status: AC
  Administered 2022-08-26: 15 mL via OROMUCOSAL

## 2022-08-26 MED ORDER — TRANEXAMIC ACID-NACL 1000-0.7 MG/100ML-% IV SOLN
1000.0000 mg | INTRAVENOUS | Status: AC
  Administered 2022-08-26: 1000 mg via INTRAVENOUS
  Filled 2022-08-26: qty 100

## 2022-08-26 MED ORDER — SODIUM CHLORIDE 0.9 % IR SOLN
Status: DC | PRN
  Administered 2022-08-26: 1000 mL

## 2022-08-26 MED ORDER — SODIUM CHLORIDE 0.9 % IV SOLN: INTRAVENOUS | Status: DC

## 2022-08-26 MED ORDER — BISACODYL 10 MG RE SUPP: 10.0000 mg | Freq: Every day | RECTAL | Status: DC | PRN

## 2022-08-26 MED ORDER — PROPOFOL 10 MG/ML IV BOLUS
INTRAVENOUS | Status: DC | PRN
Start: 2022-08-26 — End: 2022-08-26
  Administered 2022-08-26 (×2): 20 mg via INTRAVENOUS

## 2022-08-26 MED ORDER — NORTRIPTYLINE HCL 25 MG PO CAPS
75.0000 mg | ORAL_CAPSULE | Freq: Every day | ORAL | Status: DC
  Administered 2022-08-26: 75 mg via ORAL
  Filled 2022-08-26: qty 3

## 2022-08-26 MED ORDER — ONDANSETRON HCL 4 MG/2ML IJ SOLN
INTRAMUSCULAR | Status: AC
  Filled 2022-08-26: qty 2

## 2022-08-26 SURGICAL SUPPLY — 59 items
ADH SKN CLS APL DERMABOND .7 (GAUZE/BANDAGES/DRESSINGS) ×1
ATTUNE MED ANAT PAT 35 KNEE (Knees) IMPLANT
BAG COUNTER SPONGE SURGICOUNT (BAG) IMPLANT
BAG SPEC THK2 15X12 ZIP CLS (MISCELLANEOUS)
BAG SPNG CNTER NS LX DISP (BAG)
BAG ZIPLOCK 12X15 (MISCELLANEOUS) IMPLANT
BASEPLATE TIB CMT FB PCKT SZ4 (Stem) IMPLANT
BLADE SAW SGTL 11.0X1.19X90.0M (BLADE) IMPLANT
BLADE SAW SGTL 13.0X1.19X90.0M (BLADE) ×1 IMPLANT
BNDG CMPR 6 X 5 YARDS HK CLSR (GAUZE/BANDAGES/DRESSINGS) ×1
BNDG CMPR MED 10X6 ELC LF (GAUZE/BANDAGES/DRESSINGS) ×1
BNDG ELASTIC 6INX 5YD STR LF (GAUZE/BANDAGES/DRESSINGS) ×1 IMPLANT
BNDG ELASTIC 6X10 VLCR STRL LF (GAUZE/BANDAGES/DRESSINGS) IMPLANT
BOWL SMART MIX CTS (DISPOSABLE) ×1 IMPLANT
BSPLAT TIB 4 CMNT FX BRNG STRL (Stem) ×1 IMPLANT
CEMENT HV SMART SET (Cement) IMPLANT
COMP FEM CMT ATTUNE NRW 5 RT (Joint) ×1 IMPLANT
COMPONENT FEM CMT ATTN NRW 5RT (Joint) IMPLANT
COOLER ICEMAN CLASSIC (MISCELLANEOUS) IMPLANT
COVER SURGICAL LIGHT HANDLE (MISCELLANEOUS) ×1 IMPLANT
CUFF TOURN SGL QUICK 34 (TOURNIQUET CUFF) ×1
CUFF TRNQT CYL 34X4.125X (TOURNIQUET CUFF) ×1 IMPLANT
DERMABOND ADVANCED .7 DNX12 (GAUZE/BANDAGES/DRESSINGS) ×1 IMPLANT
DRAPE U-SHAPE 47X51 STRL (DRAPES) ×1 IMPLANT
DRESSING AQUACEL AG SP 3.5X10 (GAUZE/BANDAGES/DRESSINGS) ×1 IMPLANT
DRSG AQUACEL AG ADV 3.5X10 (GAUZE/BANDAGES/DRESSINGS) IMPLANT
DRSG AQUACEL AG SP 3.5X10 (GAUZE/BANDAGES/DRESSINGS) ×1
DURAPREP 26ML APPLICATOR (WOUND CARE) ×2 IMPLANT
ELECT REM PT RETURN 15FT ADLT (MISCELLANEOUS) ×1 IMPLANT
GLOVE BIO SURGEON STRL SZ 6 (GLOVE) ×1 IMPLANT
GLOVE BIOGEL PI IND STRL 6.5 (GLOVE) ×1 IMPLANT
GLOVE BIOGEL PI IND STRL 7.5 (GLOVE) ×1 IMPLANT
GLOVE ORTHO TXT STRL SZ7.5 (GLOVE) ×2 IMPLANT
GOWN STRL REUS W/ TWL LRG LVL3 (GOWN DISPOSABLE) ×2 IMPLANT
GOWN STRL REUS W/TWL LRG LVL3 (GOWN DISPOSABLE) ×2
HANDPIECE INTERPULSE COAX TIP (DISPOSABLE) ×1
HOLDER FOLEY CATH W/STRAP (MISCELLANEOUS) IMPLANT
INSERT MED ATTUNE KNEE 5 7 RT (Insert) IMPLANT
KIT TURNOVER KIT A (KITS) IMPLANT
MANIFOLD NEPTUNE II (INSTRUMENTS) ×1 IMPLANT
NDL SAFETY ECLIP 18X1.5 (MISCELLANEOUS) IMPLANT
NS IRRIG 1000ML POUR BTL (IV SOLUTION) ×1 IMPLANT
PACK TOTAL KNEE CUSTOM (KITS) ×1 IMPLANT
PIN FIX SIGMA LCS THRD HI (PIN) IMPLANT
PROTECTOR NERVE ULNAR (MISCELLANEOUS) ×1 IMPLANT
SET HNDPC FAN SPRY TIP SCT (DISPOSABLE) ×1 IMPLANT
SET PAD KNEE POSITIONER (MISCELLANEOUS) ×1 IMPLANT
SPIKE FLUID TRANSFER (MISCELLANEOUS) ×2 IMPLANT
SUT MNCRL AB 4-0 PS2 18 (SUTURE) ×1 IMPLANT
SUT STRATAFIX PDS+ 0 24IN (SUTURE) ×1 IMPLANT
SUT VIC AB 1 CT1 36 (SUTURE) ×1 IMPLANT
SUT VIC AB 2-0 CT1 27 (SUTURE) ×2
SUT VIC AB 2-0 CT1 TAPERPNT 27 (SUTURE) ×2 IMPLANT
SYR 3ML LL SCALE MARK (SYRINGE) ×1 IMPLANT
TOWEL GREEN STERILE FF (TOWEL DISPOSABLE) ×1 IMPLANT
TRAY FOLEY MTR SLVR 16FR STAT (SET/KITS/TRAYS/PACK) ×1 IMPLANT
TUBE SUCTION HIGH CAP CLEAR NV (SUCTIONS) ×1 IMPLANT
WATER STERILE IRR 1000ML POUR (IV SOLUTION) ×2 IMPLANT
WRAP KNEE MAXI GEL POST OP (GAUZE/BANDAGES/DRESSINGS) ×1 IMPLANT

## 2022-08-26 NOTE — Plan of Care (Signed)
  Problem: Education: Goal: Knowledge of the prescribed therapeutic regimen will improve Outcome: Progressing   Problem: Activity: Goal: Ability to avoid complications of mobility impairment will improve Outcome: Progressing   Problem: Pain Management: Goal: Pain level will decrease with appropriate interventions Outcome: Progressing   Problem: Education: Goal: Knowledge of General Education information will improve Description: Including pain rating scale, medication(s)/side effects and non-pharmacologic comfort measures Outcome: Progressing   Problem: Activity: Goal: Risk for activity intolerance will decrease Outcome: Progressing   Problem: Nutrition: Goal: Adequate nutrition will be maintained Outcome: Progressing   Problem: Elimination: Goal: Will not experience complications related to bowel motility Outcome: Progressing   Problem: Pain Managment: Goal: General experience of comfort will improve Outcome: Progressing   

## 2022-08-26 NOTE — Anesthesia Procedure Notes (Signed)
Spinal  Patient location during procedure: OR Start time: 08/26/2022 12:42 PM End time: 08/26/2022 12:44 PM Reason for block: surgical anesthesia Staffing Performed: resident/CRNA  Anesthesiologist: Eilene Ghazi, MD Resident/CRNA: Doran Clay, CRNA Performed by: Doran Clay, CRNA Authorized by: Eilene Ghazi, MD   Preanesthetic Checklist Completed: patient identified, IV checked, site marked, risks and benefits discussed, surgical consent, monitors and equipment checked, pre-op evaluation and timeout performed Spinal Block Patient position: sitting Prep: DuraPrep Patient monitoring: cardiac monitor, heart rate, continuous pulse ox and blood pressure Approach: midline Location: L3-4 Needle Needle type: Pencan  Needle gauge: 24 G Needle length: 10 cm Needle insertion depth: 7 cm Assessment Sensory level: T6 Events: CSF return Additional Notes Timeout performed. L3-4 identified. Cleansed with Duraprep. SAB without difficulty. To supine position.

## 2022-08-26 NOTE — Anesthesia Postprocedure Evaluation (Signed)
Anesthesia Post Note  Patient: Rachael Callahan  Procedure(s) Performed: TOTAL KNEE ARTHROPLASTY (Right: Knee)     Patient location during evaluation: PACU Anesthesia Type: Spinal Level of consciousness: oriented and awake and alert Pain management: pain level controlled Vital Signs Assessment: post-procedure vital signs reviewed and stable Respiratory status: spontaneous breathing, respiratory function stable and patient connected to nasal cannula oxygen Cardiovascular status: blood pressure returned to baseline and stable Postop Assessment: no headache, no backache and no apparent nausea or vomiting Anesthetic complications: no  No notable events documented.  Last Vitals:  Vitals:   08/26/22 1515 08/26/22 1600  BP: (!) 160/71   Pulse: 67 71  Resp: 12 18  Temp: 36.4 C (!) 36.4 C  SpO2: 100% 99%    Last Pain:  Vitals:   08/26/22 1600  TempSrc:   PainSc: 0-No pain                 Vivian Neuwirth S

## 2022-08-26 NOTE — Transfer of Care (Signed)
Immediate Anesthesia Transfer of Care Note  Patient: Mendel Corning  Procedure(s) Performed: TOTAL KNEE ARTHROPLASTY (Right: Knee)  Patient Location: PACU  Anesthesia Type:Spinal  Level of Consciousness: sedated  Airway & Oxygen Therapy: Patient Spontanous Breathing and Patient connected to face mask oxygen  Post-op Assessment: Report given to RN and Post -op Vital signs reviewed and stable  Post vital signs: Reviewed and stable  Last Vitals:  Vitals Value Taken Time  BP    Temp    Pulse 68 08/26/22 1424  Resp 12 08/26/22 1424  SpO2 98 % 08/26/22 1424  Vitals shown include unfiled device data.  Last Pain:  Vitals:   08/26/22 1210  TempSrc:   PainSc: 0-No pain         Complications: No notable events documented.

## 2022-08-26 NOTE — Anesthesia Procedure Notes (Signed)
Anesthesia Regional Block: Adductor canal block   Pre-Anesthetic Checklist: , timeout performed,  Correct Patient, Correct Site, Correct Laterality,  Correct Procedure, Correct Position, site marked,  Risks and benefits discussed,  Surgical consent,  Pre-op evaluation,  At surgeon's request and post-op pain management  Laterality: Right  Prep: chloraprep       Needles:  Injection technique: Single-shot  Needle Type: Echogenic Needle     Needle Length: 9cm      Additional Needles:   Procedures:,,,, ultrasound used (permanent image in chart),,    Narrative:  Start time: 08/26/2022 11:55 AM End time: 08/26/2022 12:00 PM Injection made incrementally with aspirations every 5 mL.  Performed by: Personally  Anesthesiologist: Eilene Ghazi, MD  Additional Notes: Patient tolerated the procedure well without complications

## 2022-08-26 NOTE — Op Note (Signed)
NAME:  Rachael Callahan                      MEDICAL RECORD NO.:  [id number]                             FACILITY:  [facility]      PHYSICIAN:  Madlyn Frankel. Charlann Boxer, M.D.  DATE OF BIRTH:  1945/03/13      DATE OF PROCEDURE:  08/26/2022                                     OPERATIVE REPORT         PREOPERATIVE DIAGNOSIS:  Right knee osteoarthritis.      POSTOPERATIVE DIAGNOSIS:  Right knee osteoarthritis.      FINDINGS:  The patient was noted to have complete loss of cartilage and   bone-on-bone arthritis with associated osteophytes in the lateral and patellofemoral compartments of   the knee with a significant synovitis and associated effusion.  The patient had failed months of conservative treatment including medications, injection therapy, activity modification.     PROCEDURE:  Right total knee replacement.      COMPONENTS USED:  DePuy Attune FB CR MS stabilized knee   system, a size 5N femur, 4 tibia, size 6 mm CR MS AOX insert, and 35 anatomic patellar   button.      SURGEON:  Madlyn Frankel. Charlann Boxer, M.D.      ASSISTANT:  Rosalene Billings, PA-C.      ANESTHESIA:  Regional and Spinal.      SPECIMENS:  None.      COMPLICATION:  None.      DRAINS:  None.  EBL: 100 cc     TOURNIQUET TIME:  27 min st 225 mmHg      The patient was stable to the recovery room.      INDICATION FOR PROCEDURE:  Rachael Callahan is a 77 y.o. female patient of   mine.  The patient had been seen, evaluated, and treated for months conservatively in the   office with medication, activity modification, and injections.  The patient had   radiographic changes of bone-on-bone arthritis with endplate sclerosis and osteophytes noted.  Based on the radiographic changes and failed conservative measures, the patient   decided to proceed with definitive treatment, total knee replacement.  Risks of infection, DVT, component failure, need for revision surgery, neurovascular injury were reviewed in the office setting.  The postop course  was reviewed stressing the efforts to maximize post-operative satisfaction and function.  Consent was obtained for benefit of pain   relief.      PROCEDURE IN DETAIL:  The patient was brought to the operative theater.   Once adequate anesthesia, preoperative antibiotics, 2 gm of Ancef,1 gm of Tranexamic Acid, and 10 mg of Decadron administered, the patient was positioned supine with a right thigh tourniquet placed.  The  right lower extremity was prepped and draped in sterile fashion.  A time-   out was performed identifying the patient, planned procedure, and the appropriate extremity.      The right lower extremity was placed in the [facility] leg holder.  The leg was   exsanguinated, tourniquet elevated to 225 mmHg.  A midline incision was   made followed by median parapatellar arthrotomy.  Following initial   exposure, attention was first  directed to the patella.  Precut   measurement was noted to be 22 mm.  I resected down to 13 mm and used a   35 anatomic patellar button to restore patellar height as well as cover the cut surface.      The lug holes were drilled and a metal shim was placed to protect the   patella from retractors and saw blade during the procedure.      At this point, attention was now directed to the femur.  The femoral   canal was opened with a drill, irrigated to try to prevent fat emboli.  An   intramedullary rod was passed at 3 degrees valgus, 9 mm of bone was   resected off the distal femur.  Following this resection, the tibia was   subluxated anteriorly.  Using the extramedullary guide, 2 mm of bone was resected off   the proximal lateral tibia.  We confirmed the gap would be   stable medially and laterally with a size 5 spacer block as well as confirmed that the tibial cut was perpendicular in the coronal plane, checking with an alignment rod.      Once this was done, I sized the femur to be a size 5 in the anterior-   posterior dimension, chose a narrow  component based on medial and   lateral dimension.  The size 5 rotation block was then pinned in   position anterior referenced using the C-clamp to set rotation.  The   anterior, posterior, and  chamfer cuts were made without difficulty nor   notching making certain that I was along the anterior cortex to help   with flexion gap stability.      The final box cut was made off the lateral aspect of distal femur.      At this point, the tibia was sized to be a size 4.  The size 4 tray was   then pinned in position through the medial third of the tubercle,   drilled, and keel punched.  Trial reduction was now carried with a 5 femur,  4 tibia, a size 6 mm PS insert, and the 35 anatomic patella botton.  The knee was brought to full extension with good flexion stability with the patella   tracking through the trochlea without application of pressure.  Given   all these findings the trial components removed.  Final components were   opened and cement was mixed.  The knee was irrigated with normal saline solution and pulse lavage.  The synovial lining was   then injected with 30 cc of 0.25% Marcaine with epinephrine, 1 cc of Toradol and 30 cc of NS for a total of 61 cc.     Final implants were then cemented onto cleaned and dried cut surfaces of bone with the knee brought to extension with a size 6 mm CR MS trial insert.      Once the cement had fully cured, excess cement was removed   throughout the knee.  I confirmed that I was satisfied with the range of   motion and stability, and the final size 6 mm CR MS AOX insert was chosen.  It was   placed into the knee.      The tourniquet had been let down at 27 minutes.  No significant   hemostasis was required.  The extensor mechanism was then reapproximated using #1 Vicryl and #1 Stratafix sutures with the knee   in flexion.  The  remaining wound was closed with 2-0 Vicryl and running 4-0 Monocryl.   The knee was cleaned, dried, dressed sterilely  using Dermabond and   Aquacel dressing.  The patient was then   brought to recovery room in stable condition, tolerating the procedure   well.   Please note that Physician Assistant, Rosalene Billings, PA-C was present for the entirety of the case, and was utilized for pre-operative positioning, peri-operative retractor management, general facilitation of the procedure and for primary wound closure at the end of the case.              Madlyn Frankel Charlann Boxer, M.D.    08/26/2022 11:14 AM

## 2022-08-26 NOTE — Anesthesia Preprocedure Evaluation (Signed)
Anesthesia Evaluation  Patient identified by MRN, date of birth, ID band Patient awake    Reviewed: Allergy & Precautions, H&P , NPO status , Patient's Chart, lab work & pertinent test results  Airway Mallampati: II  TM Distance: >3 FB Neck ROM: Full    Dental no notable dental hx.    Pulmonary neg pulmonary ROS   Pulmonary exam normal breath sounds clear to auscultation       Cardiovascular hypertension, Pt. on medications Normal cardiovascular exam Rhythm:Regular Rate:Normal     Neuro/Psych negative neurological ROS  negative psych ROS   GI/Hepatic Neg liver ROS, PUD,,,  Endo/Other  negative endocrine ROS    Renal/GU negative Renal ROS  negative genitourinary   Musculoskeletal  (+) Arthritis , Osteoarthritis,    Abdominal   Peds negative pediatric ROS (+)  Hematology negative hematology ROS (+)   Anesthesia Other Findings   Reproductive/Obstetrics negative OB ROS                             Anesthesia Physical Anesthesia Plan  ASA: 2  Anesthesia Plan: Spinal   Post-op Pain Management: Regional block*   Induction: Intravenous  PONV Risk Score and Plan: 2 and Ondansetron, Dexamethasone and Treatment may vary due to age or medical condition  Airway Management Planned: Simple Face Mask  Additional Equipment:   Intra-op Plan:   Post-operative Plan:   Informed Consent: I have reviewed the patients History and Physical, chart, labs and discussed the procedure including the risks, benefits and alternatives for the proposed anesthesia with the patient or authorized representative who has indicated his/her understanding and acceptance.     Dental advisory given  Plan Discussed with: CRNA and Surgeon  Anesthesia Plan Comments:        Anesthesia Quick Evaluation

## 2022-08-26 NOTE — Evaluation (Signed)
Physical Therapy Evaluation Patient Details Name: Rachael Callahan MRN: [id number] DOB: 12-11-1945 Today's Date: 08/26/2022  History of Present Illness  77 yo female presents to therapy s/p R TKA on 08/26/2022 due to failure of conservative measures. Pt PMH includes but is not limited to: HLD, HTN, migraine, R RTC repair and B CT release.  Clinical Impression    Rachael Callahan is a 77 y.o. female POD 0 s/p R TKA. Patient reports IND with mobility at baseline. Patient is now limited by functional impairments (see PT problem list below) and requires min A for supine to sit and min guard for lateral scoot to L toward HOB and min guard for sit to supine. PT eval limited due to slow regression of anesthesia and pt report of abn sensation buttock and R proximal LE. Unsafe at time of eval to assess transfers or gait. Patient will benefit from continued skilled PT interventions to address impairments and progress towards PLOF. Acute PT will follow to progress mobility and stair training in preparation for safe discharge home with family support and OPPT services.       If plan is discharge home, recommend the following: Two people to help with walking and/or transfers;A lot of help with bathing/dressing/bathroom;Assistance with cooking/housework;Assist for transportation;Help with stairs or ramp for entrance   Can travel by private vehicle        Equipment Recommendations None recommended by PT  Recommendations for Other Services       Functional Status Assessment Patient has had a recent decline in their functional status and demonstrates the ability to make significant improvements in function in a reasonable and predictable amount of time.     Precautions / Restrictions Precautions Precautions: Knee;Fall Restrictions Weight Bearing Restrictions: No      Mobility  Bed Mobility Overal bed mobility: Needs Assistance Bed Mobility: Supine to Sit, Sit to Supine     Supine to sit: Min assist, HOB  elevated Sit to supine: Min guard   General bed mobility comments: min A to initiate R LE to EOB for supine to sit, pt able to tolerate sitting EOB no c/o dizziness and no instability noted and pt able to latteral scoot toward HOB to the L with min guard and sit to supine with min guard    Transfers                   General transfer comment: NT due to slow regresion of anesthesia    Ambulation/Gait               General Gait Details: NT due to slow regresion of anesthesia  Stairs            Wheelchair Mobility     Tilt Bed    Modified Rankin (Stroke Patients Only)       Balance Overall balance assessment: Needs assistance Sitting-balance support: Feet supported Sitting balance-Leahy Scale: Good                                       Pertinent Vitals/Pain Pain Assessment Pain Assessment: 0-10 Pain Score: 2  Pain Location: R knee Pain Descriptors / Indicators: Burning, Operative site guarding Pain Intervention(s): Limited activity within patient's tolerance, Monitored during session, Premedicated before session, Repositioned, Ice applied    Home Living Family/patient expects to be discharged to:: Private residence Living Arrangements: Spouse/significant other Available Help at Discharge: Family Type of  Home: House (Condo) Home Access: Stairs to enter Entrance Stairs-Rails: None Entrance Stairs-Number of Steps: 1 (from the garage)   Home Layout: One level Home Equipment: Agricultural consultant (2 wheels);Cane - quad      Prior Function Prior Level of Function : Independent/Modified Independent;Driving             Mobility Comments: IND for all ADLs, self care tasks and IADLs.       Hand Dominance        Extremity/Trunk Assessment        Lower Extremity Assessment Lower Extremity Assessment: RLE deficits/detail RLE Deficits / Details: ankle DF/PF 5/5; SLR < 10 degree lag, unable to perform glute set on R side RLE  Sensation: decreased light touch;history of peripheral neuropathy    Cervical / Trunk Assessment Cervical / Trunk Assessment: Normal  Communication   Communication: No difficulties  Cognition Arousal/Alertness: Awake/alert Behavior During Therapy: WFL for tasks assessed/performed Overall Cognitive Status: Within Functional Limits for tasks assessed                                          General Comments      Exercises     Assessment/Plan    PT Assessment Patient needs continued PT services  PT Problem List Decreased strength;Decreased range of motion;Decreased activity tolerance;Decreased balance;Decreased mobility;Decreased coordination;Pain       PT Treatment Interventions DME instruction;Gait training;Stair training;Functional mobility training;Therapeutic activities;Therapeutic exercise;Balance training;Neuromuscular re-education;Patient/family education;Modalities    PT Goals (Current goals can be found in the Care Plan section)  Acute Rehab PT Goals Patient Stated Goal: to be able to go for longer walks, get up and down off the floor and play with the greatgrandchildren PT Goal Formulation: With patient Time For Goal Achievement: 09/16/22 Potential to Achieve Goals: Good    Frequency 7X/week     Co-evaluation               AM-PAC PT "6 Clicks" Mobility  Outcome Measure Help needed turning from your back to your side while in a flat bed without using bedrails?: A Little Help needed moving from lying on your back to sitting on the side of a flat bed without using bedrails?: A Little Help needed moving to and from a bed to a chair (including a wheelchair)?: Total Help needed standing up from a chair using your arms (e.g., wheelchair or bedside chair)?: Total Help needed to walk in hospital room?: Total Help needed climbing 3-5 steps with a railing? : Total 6 Click Score: 10    End of Session   Activity Tolerance: Other (comment) (eval  limited due to slow regresion of anesthesia) Patient left: in bed;with call bell/phone within reach;with nursing/sitter in room;with family/visitor present Nurse Communication: Mobility status PT Visit Diagnosis: Unsteadiness on feet (R26.81);Other abnormalities of gait and mobility (R26.89);Muscle weakness (generalized) (M62.81);Difficulty in walking, not elsewhere classified (R26.2);Pain Pain - Right/Left: Right Pain - part of body: Knee;Leg    Time: 1610-9604 PT Time Calculation (min) (ACUTE ONLY): 24 min   Charges:   PT Evaluation $PT Eval Low Complexity: 1 Low PT Treatments $Therapeutic Activity: 8-22 mins PT General Charges $$ ACUTE PT VISIT: 1 Visit         Johnny Bridge, PT Acute Rehab   Jacqualyn Posey 08/26/2022, 7:58 PM

## 2022-08-26 NOTE — Progress Notes (Signed)
Error in charting for MEWS score

## 2022-08-26 NOTE — Anesthesia Procedure Notes (Signed)
Anesthesia Procedure Image    

## 2022-08-26 NOTE — Discharge Instructions (Signed)

## 2022-08-26 NOTE — Interval H&P Note (Signed)
History and Physical Interval Note:  08/26/2022 11:14 AM  Rachael Callahan  has presented today for surgery, with the diagnosis of Right knee osteoarthritis.  The various methods of treatment have been discussed with the patient and family. After consideration of risks, benefits and other options for treatment, the patient has consented to  Procedure(s): TOTAL KNEE ARTHROPLASTY (Right) as a surgical intervention.  The patient's history has been reviewed, patient examined, no change in status, stable for surgery.  I have reviewed the patient's chart and labs.  Questions were answered to the patient's satisfaction.     Shelda Pal

## 2022-08-27 ENCOUNTER — Encounter (HOSPITAL_COMMUNITY): Payer: Self-pay | Admitting: Orthopedic Surgery

## 2022-08-27 DIAGNOSIS — M1711 Unilateral primary osteoarthritis, right knee: Secondary | ICD-10-CM | POA: Diagnosis not present

## 2022-08-27 MED ORDER — METHOCARBAMOL 500 MG PO TABS: 500.0000 mg | ORAL_TABLET | Freq: Four times a day (QID) | ORAL | 2 refills | Status: AC | PRN

## 2022-08-27 MED ORDER — POLYETHYLENE GLYCOL 3350 17 G PO PACK: 17.0000 g | PACK | Freq: Two times a day (BID) | ORAL | 0 refills | Status: AC

## 2022-08-27 MED ORDER — ASPIRIN 81 MG PO CHEW: 81.0000 mg | CHEWABLE_TABLET | Freq: Two times a day (BID) | ORAL | 0 refills | Status: AC

## 2022-08-27 MED ORDER — SENNA 8.6 MG PO TABS: 2.0000 | ORAL_TABLET | Freq: Every day | ORAL | 0 refills | Status: AC

## 2022-08-27 MED ORDER — OXYCODONE HCL 5 MG PO TABS: 5.0000 mg | ORAL_TABLET | ORAL | 0 refills | Status: DC | PRN

## 2022-08-27 NOTE — TOC Transition Note (Signed)
Transition of Care [facility]) - CM/SW Discharge Note  Patient Details  Name: Rachael Callahan MRN: [id number] Date of Birth: 03-10-1945  [facility] CM/SW Contact:  Ewing Schlein, LCSW Phone Number: 08/27/2022, 10:51 AM  Clinical Narrative: Patient is expected to discharge home after working with PT. CSW met with patient to confirm discharge plan and needs. Patient will go home with OPPT at [facility]. Patient will need a rolling walker, which [employer] delivered to patient's room. TOC signing off.   Final next level of care: OP Rehab Barriers to Discharge: No Barriers Identified  Patient Goals and CMS Choice CMS Medicare.gov Compare Post Acute Care list provided to:: Patient Choice offered to / list presented to : Patient  Discharge Plan and Services Additional resources added to the After Visit Summary for         DME Arranged: Walker rolling DME Agency: [employer] Representative spoke with at DME Agency: Prearranged in orthopedist's office  Social Determinants of Health (SDOH) Interventions SDOH Screenings   Food Insecurity: No Food Insecurity (08/26/2022)  Housing: Low Risk  (08/26/2022)  Transportation Needs: No Transportation Needs (08/26/2022)  Utilities: Not At Risk (08/26/2022)  Tobacco Use: Low Risk  (08/26/2022)   Readmission Risk Interventions     No data to display

## 2022-08-27 NOTE — Plan of Care (Signed)
  Problem: Education: Goal: Knowledge of the prescribed therapeutic regimen will improve Outcome: Progressing Goal: Individualized Educational Video(s) Outcome: Progressing   Problem: Activity: Goal: Ability to avoid complications of mobility impairment will improve Outcome: Progressing Goal: Range of joint motion will improve Outcome: Progressing   Problem: Clinical Measurements: Goal: Postoperative complications will be avoided or minimized Outcome: Progressing   Problem: Pain Management: Goal: Pain level will decrease with appropriate interventions Outcome: Progressing   Problem: Education: Goal: Knowledge of General Education information will improve Description: Including pain rating scale, medication(s)/side effects and non-pharmacologic comfort measures Outcome: Progressing   Problem: Health Behavior/Discharge Planning: Goal: Ability to manage health-related needs will improve Outcome: Progressing   Problem: Clinical Measurements: Goal: Ability to maintain clinical measurements within normal limits will improve Outcome: Progressing Goal: Will remain free from infection Outcome: Progressing Goal: Diagnostic test results will improve Outcome: Progressing Goal: Respiratory complications will improve Outcome: Progressing Goal: Cardiovascular complication will be avoided Outcome: Progressing   Problem: Activity: Goal: Risk for activity intolerance will decrease Outcome: Progressing   Problem: Nutrition: Goal: Adequate nutrition will be maintained Outcome: Progressing   Problem: Coping: Goal: Level of anxiety will decrease Outcome: Progressing   Problem: Elimination: Goal: Will not experience complications related to bowel motility Outcome: Progressing Goal: Will not experience complications related to urinary retention Outcome: Progressing   Problem: Pain Managment: Goal: General experience of comfort will improve Outcome: Progressing   Problem:  Safety: Goal: Ability to remain free from injury will improve Outcome: Progressing

## 2022-08-27 NOTE — Plan of Care (Signed)
  Problem: Education: Goal: Knowledge of the prescribed therapeutic regimen will improve Outcome: Adequate for Discharge Goal: Individualized Educational Video(s) Outcome: Adequate for Discharge   Problem: Activity: Goal: Ability to avoid complications of mobility impairment will improve Outcome: Adequate for Discharge Goal: Range of joint motion will improve Outcome: Adequate for Discharge   Problem: Clinical Measurements: Goal: Postoperative complications will be avoided or minimized Outcome: Adequate for Discharge   Problem: Pain Management: Goal: Pain level will decrease with appropriate interventions Outcome: Adequate for Discharge   Problem: Education: Goal: Knowledge of General Education information will improve Description: Including pain rating scale, medication(s)/side effects and non-pharmacologic comfort measures Outcome: Adequate for Discharge   Problem: Health Behavior/Discharge Planning: Goal: Ability to manage health-related needs will improve Outcome: Adequate for Discharge   Problem: Clinical Measurements: Goal: Ability to maintain clinical measurements within normal limits will improve Outcome: Adequate for Discharge Goal: Will remain free from infection Outcome: Adequate for Discharge Goal: Diagnostic test results will improve Outcome: Adequate for Discharge Goal: Respiratory complications will improve Outcome: Adequate for Discharge Goal: Cardiovascular complication will be avoided Outcome: Adequate for Discharge   Problem: Activity: Goal: Risk for activity intolerance will decrease Outcome: Adequate for Discharge   Problem: Nutrition: Goal: Adequate nutrition will be maintained Outcome: Adequate for Discharge   Problem: Coping: Goal: Level of anxiety will decrease Outcome: Adequate for Discharge   Problem: Elimination: Goal: Will not experience complications related to bowel motility Outcome: Adequate for Discharge Goal: Will not  experience complications related to urinary retention Outcome: Adequate for Discharge   Problem: Pain Managment: Goal: General experience of comfort will improve Outcome: Adequate for Discharge   Problem: Safety: Goal: Ability to remain free from injury will improve Outcome: Adequate for Discharge

## 2022-08-27 NOTE — Progress Notes (Signed)
   Subjective: 1 Day Post-Op Procedure(s) (LRB): TOTAL KNEE ARTHROPLASTY (Right) Patient reports pain as mild.   Patient seen in rounds by Dr. Charlann Boxer. Patient is well, and has had no acute complaints or problems. No acute events overnight. Foley catheter removed. Patient has not been up with PT yet.  We will start therapy today.   Objective: Vital signs in last 24 hours: Temp:  [95.7 F (35.4 C)-98.1 F (36.7 C)] 97.7 F (36.5 C) (07/31 0620) Pulse Rate:  [62-90] 81 (07/31 0620) Resp:  [10-20] 17 (07/31 0620) BP: (117-160)/(55-98) 147/86 (07/31 0620) SpO2:  [95 %-100 %] 99 % (07/31 0620) Weight:  [83.9 kg] 83.9 kg (07/30 1035)  Intake/Output from previous day:  Intake/Output Summary (Last 24 hours) at 08/27/2022 0743 Last data filed at 08/27/2022 0600 Gross per 24 hour  Intake 2997.77 ml  Output 1955 ml  Net 1042.77 ml     Intake/Output this shift: No intake/output data recorded.  Labs: Recent Labs    08/27/22 0410  HGB 11.7*   Recent Labs    08/27/22 0410  WBC 9.9  RBC 3.78*  HCT 35.9*  PLT 205   Recent Labs    08/27/22 0410  NA 136  K 4.5  CL 103  CO2 25  BUN 13  CREATININE 0.74  GLUCOSE 182*  CALCIUM 8.7*   No results for input(s): "LABPT", "INR" in the last 72 hours.  Exam: General - Patient is Alert and Oriented Extremity - Neurologically intact Sensation intact distally Intact pulses distally Dorsiflexion/Plantar flexion intact Dressing - dressing C/D/I Motor Function - intact, moving foot and toes well on exam.   Past Medical History:  Diagnosis Date   Arthritis    Depression    High cholesterol    Hypertension    Migraine    Stomach ulcer     Assessment/Plan: 1 Day Post-Op Procedure(s) (LRB): TOTAL KNEE ARTHROPLASTY (Right) Principal Problem:   S/P total knee arthroplasty, right  Estimated body mass index is 30.79 kg/m as calculated from the following:   Height as of this encounter: 5\' 5"  (1.651 m).   Weight as of this  encounter: 83.9 kg. Advance diet Up with therapy D/C IV fluids   Patient's anticipated LOS is less than 2 midnights, meeting these requirements: - Younger than 3 - Lives within 1 hour of care - Has a competent adult at home to recover with post-op recover - NO history of  - Chronic pain requiring opiods  - Diabetes  - Coronary Artery Disease  - Heart failure  - Heart attack  - Stroke  - DVT/VTE  - Cardiac arrhythmia  - Respiratory Failure/COPD  - Renal failure  - Anemia  - Advanced Liver disease     DVT Prophylaxis - Aspirin Weight bearing as tolerated.  Hgb stable at 11.7 this AM.  Plan is to go Home after hospital stay. Plan for discharge today following 1-2 sessions of PT as long as they are meeting their goals. Patient is scheduled for OPPT. Follow up in the office in 2 weeks.   Rosalene Billings, PA-C Orthopedic Surgery [phone] 08/27/2022, 7:43 AM

## 2022-08-27 NOTE — Progress Notes (Signed)
Physical Therapy Treatment Patient Details Name: Rachael Callahan MRN: [id number] DOB: [birth year] Today's Date: 08/27/2022   History of Present Illness 77 yo female presents to therapy s/p R TKA on 08/26/2022 due to failure of conservative measures. Pt PMH includes but is not limited to: HLD, HTN, migraine, R RTC repair and B CT release.    PT Comments  Pt making excellent progress, meeting PT goals and feels ready to d/c with family assist prn; anticipate continued progress in OP setting     If plan is discharge home, recommend the following: Assist for transportation;Help with stairs or ramp for entrance;A little help with walking and/or transfers;Assistance with cooking/housework;A little help with bathing/dressing/bathroom   Can travel by private vehicle        Equipment Recommendations  None recommended by PT    Recommendations for Other Services       Precautions / Restrictions Precautions Precautions: Knee;Fall Restrictions Weight Bearing Restrictions: No RLE Weight Bearing: Weight bearing as tolerated     Mobility  Bed Mobility Overal bed mobility: Needs Assistance Bed Mobility: Sit to Supine       Sit to supine: Supervision   General bed mobility comments: for safety; no physical assist    Transfers Overall transfer level: Needs assistance Equipment used: Rolling walker (2 wheels) Transfers: Sit to/from Stand Sit to Stand: Supervision           General transfer comment: cues for hand placement    Ambulation/Gait Ambulation/Gait assistance: Supervision Gait Distance (Feet): 100 Feet Assistive device: Rolling walker (2 wheels) Gait Pattern/deviations: Step-to pattern, Step-through pattern       General Gait Details: cues for initial sequence, progression to step through; good quad activation, no buckling or knee instability noted   Stairs             Wheelchair Mobility     Tilt Bed    Modified Rankin (Stroke Patients Only)        Balance Overall balance assessment: Needs assistance Sitting-balance support: Feet supported Sitting balance-Leahy Scale: Good                                      Cognition Arousal/Alertness: Awake/alert Behavior During Therapy: WFL for tasks assessed/performed Overall Cognitive Status: Within Functional Limits for tasks assessed                                          Exercises Total Joint Exercises Ankle Circles/Pumps: AROM, Both, 10 reps Quad Sets: AROM, Both, 10 reps Heel Slides: AAROM, Right, 10 reps Goniometric ROM: ~5 to 70 degrees knee flexion    General Comments        Pertinent Vitals/Pain Pain Assessment Pain Assessment: 0-10 Pain Score: 2  Pain Location: R knee Pain Descriptors / Indicators: Burning, Sore Pain Intervention(s): Limited activity within patient's tolerance, Monitored during session, Premedicated before session, Repositioned, Ice applied    Home Living                          Prior Function            PT Goals (current goals can now be found in the care plan section) Acute Rehab PT Goals Patient Stated Goal: to be able to go for longer walks, get up and  down off the floor and play with the greatgrandchildren PT Goal Formulation: With patient Time For Goal Achievement: 09/16/22 Potential to Achieve Goals: Good Progress towards PT goals: Progressing toward goals    Frequency    7X/week      PT Plan Current plan remains appropriate    Co-evaluation              AM-PAC PT "6 Clicks" Mobility   Outcome Measure  Help needed turning from your back to your side while in a flat bed without using bedrails?: A Little Help needed moving from lying on your back to sitting on the side of a flat bed without using bedrails?: A Little Help needed moving to and from a bed to a chair (including a wheelchair)?: A Little Help needed standing up from a chair using your arms (e.g., wheelchair or  bedside chair)?: A Little Help needed to walk in hospital room?: A Little Help needed climbing 3-5 steps with a railing? : A Little 6 Click Score: 18    End of Session   Activity Tolerance: Other (comment) (eval limited due to slow regresion of anesthesia) Patient left: in bed;with call bell/phone within reach;with nursing/sitter in room Nurse Communication: Mobility status PT Visit Diagnosis: Unsteadiness on feet (R26.81);Other abnormalities of gait and mobility (R26.89);Muscle weakness (generalized) (M62.81);Difficulty in walking, not elsewhere classified (R26.2);Pain Pain - Right/Left: Right Pain - part of body: Knee;Leg     Time: 1001-1029 PT Time Calculation (min) (ACUTE ONLY): 28 min  Charges:    $Gait Training: 8-22 mins $Therapeutic Exercise: 8-22 mins PT General Charges $$ ACUTE PT VISIT: 1 Visit                     Janayla Marik, PT  Acute Rehab Dept [facility]) [phone]  08/27/2022    [facility] 08/27/2022, 10:34 AM

## 2022-09-02 NOTE — Discharge Summary (Signed)
Patient ID: Rachael Callahan MRN: [id number] DOB/AGE: [birth year] 77 y.o.  Admit date: 08/26/2022 Discharge date: 08/27/2022  Admission Diagnoses:  Right knee osteoarthritis  Discharge Diagnoses:  Principal Problem:   S/P total knee arthroplasty, right   Past Medical History:  Diagnosis Date   Arthritis    Depression    High cholesterol    Hypertension    Migraine    Stomach ulcer     Surgeries: Procedure(s): TOTAL KNEE ARTHROPLASTY on 08/26/2022   Consultants:   Discharged Condition: Improved  Hospital Course: Rachael Callahan is an 77 y.o. female who was admitted 08/26/2022 for operative treatment ofS/P total knee arthroplasty, right. Patient has severe unremitting pain that affects sleep, daily activities, and work/hobbies. After pre-op clearance the patient was taken to the operating room on 08/26/2022 and underwent  Procedure(s): TOTAL KNEE ARTHROPLASTY.    Patient was given perioperative antibiotics:  Anti-infectives (From admission, onward)    Start     Dose/Rate Route Frequency Ordered Stop   08/26/22 1845  ceFAZolin (ANCEF) IVPB 2g/100 mL premix        2 g 200 mL/hr over 30 Minutes Intravenous Every 6 hours 08/26/22 1711 08/27/22 0053   08/26/22 1045  ceFAZolin (ANCEF) IVPB 2g/100 mL premix        2 g 200 mL/hr over 30 Minutes Intravenous On call to O.R. 08/26/22 1035 08/26/22 1245        Patient was given sequential compression devices, early ambulation, and chemoprophylaxis to prevent DVT. Patient worked with PT and was meeting their goals regarding safe ambulation and transfers.  Patient benefited maximally from hospital stay and there were no complications.    Recent vital signs: No data found.   Recent laboratory studies: No results for input(s): "WBC", "HGB", "HCT", "PLT", "NA", "K", "CL", "CO2", "BUN", "CREATININE", "GLUCOSE", "INR", "CALCIUM" in the last 72 hours.  Invalid input(s): "PT", "2"   Discharge Medications:   Allergies as of 08/27/2022   No  Known Allergies      Medication List     STOP taking these medications    aspirin EC 81 MG tablet Replaced by: aspirin 81 MG chewable tablet       TAKE these medications    ALPRAZolam 0.25 MG tablet Commonly known as: XANAX TAKE 1-2 TABLETS EVERYDAY AS NEEDED ANXIETY   aspirin 81 MG chewable tablet Chew 1 tablet (81 mg total) by mouth 2 (two) times daily for 28 days. Replaces: aspirin EC 81 MG tablet   Cholecalciferol 125 MCG (5000 UT) Tabs Take 5,000 Units by mouth daily.   methocarbamol 500 MG tablet Commonly known as: ROBAXIN Take 1 tablet (500 mg total) by mouth every 6 (six) hours as needed for muscle spasms (muscle pain).   methylphenidate 54 MG CR tablet Commonly known as: CONCERTA Take 1 tablet (54 mg total) by mouth daily. What changed: Another medication with the same name was removed. Continue taking this medication, and follow the directions you see here.   metoprolol succinate 50 MG 24 hr tablet Commonly known as: TOPROL-XL Take 50 mg by mouth daily.   MULTIVITAMIN ADULT PO Take 1 tablet by mouth daily.   nortriptyline 25 MG capsule Commonly known as: PAMELOR TAKE 3 CAPSULES AT NIGHT   olmesartan 40 MG tablet Commonly known as: BENICAR Take 40 mg by mouth daily.   omeprazole 40 MG capsule Commonly known as: PRILOSEC Take 40 mg by mouth daily.   oxyCODONE 5 MG immediate release tablet Commonly known as: Oxy IR/ROXICODONE Take 1  tablet (5 mg total) by mouth every 4 (four) hours as needed for severe pain.   polyethylene glycol 17 g packet Commonly known as: MIRALAX / GLYCOLAX Take 17 g by mouth 2 (two) times daily.   pramipexole 1 MG tablet Commonly known as: MIRAPEX Take 1 tablet (1 mg total) by mouth 4 (four) times daily.   pravastatin 20 MG tablet Commonly known as: PRAVACHOL Take 20 mg by mouth at bedtime.   PROBIOTIC-10 PO Take 1 tablet by mouth daily.   senna 8.6 MG Tabs tablet Commonly known as: SENOKOT Take 2 tablets (17.2  mg total) by mouth at bedtime for 14 days.               Discharge Care Instructions  (From admission, onward)           Start     Ordered   08/27/22 0000  Change dressing       Comments: Maintain surgical dressing until follow up in the clinic. If the edges start to pull up, may reinforce with tape. If the dressing is no longer working, may remove and cover with gauze and tape, but must keep the area dry and clean.  Call with any questions or concerns.   08/27/22 0745            Diagnostic Studies: No results found.  Disposition: Discharge disposition: 01-Home or Self Care       Discharge Instructions     Call MD / Call 911   Complete by: As directed    If you experience chest pain or shortness of breath, CALL 911 and be transported to the hospital emergency room.  If you develope a fever above 101 F, pus (white drainage) or increased drainage or redness at the wound, or calf pain, call your surgeon's office.   Change dressing   Complete by: As directed    Maintain surgical dressing until follow up in the clinic. If the edges start to pull up, may reinforce with tape. If the dressing is no longer working, may remove and cover with gauze and tape, but must keep the area dry and clean.  Call with any questions or concerns.   Constipation Prevention   Complete by: As directed    Drink plenty of fluids.  Prune juice may be helpful.  You may use a stool softener, such as Colace (over the counter) 100 mg twice a day.  Use MiraLax (over the counter) for constipation as needed.   Diet - low sodium heart healthy   Complete by: As directed    Increase activity slowly as tolerated   Complete by: As directed    Weight bearing as tolerated with assist device (walker, cane, etc) as directed, use it as long as suggested by your surgeon or therapist, typically at least 4-6 weeks.   Post-operative opioid taper instructions:   Complete by: As directed    POST-OPERATIVE OPIOID  TAPER INSTRUCTIONS: It is important to wean off of your opioid medication as soon as possible. If you do not need pain medication after your surgery it is ok to stop day one. Opioids include: Codeine, Hydrocodone(Norco, Vicodin), Oxycodone(Percocet, oxycontin) and hydromorphone amongst others.  Long term and even short term use of opiods can cause: Increased pain response Dependence Constipation Depression Respiratory depression And more.  Withdrawal symptoms can include Flu like symptoms Nausea, vomiting And more Techniques to manage these symptoms Hydrate well Eat regular healthy meals Stay active Use relaxation techniques(deep breathing, meditating, yoga)  Do Not substitute Alcohol to help with tapering If you have been on opioids for less than two weeks and do not have pain than it is ok to stop all together.  Plan to wean off of opioids This plan should start within one week post op of your joint replacement. Maintain the same interval or time between taking each dose and first decrease the dose.  Cut the total daily intake of opioids by one tablet each day Next start to increase the time between doses. The last dose that should be eliminated is the evening dose.      TED hose   Complete by: As directed    Use stockings (TED hose) for 2 weeks on both leg(s).  You may remove them at night for sleeping.        Follow-up Information     Durene Romans, MD. Schedule an appointment as soon as possible for a visit in 2 week(s).   Specialty: Orthopedic Surgery Contact information: [address] [place] [address] [place] [address] [phone]                  Signed: Cassandria Anger 09/02/2022, 7:19 AM

## 2022-09-04 ENCOUNTER — Other Ambulatory Visit: Payer: Self-pay | Admitting: Psychiatry

## 2022-09-04 DIAGNOSIS — F339 Major depressive disorder, recurrent, unspecified: Secondary | ICD-10-CM

## 2022-09-04 DIAGNOSIS — G2581 Restless legs syndrome: Secondary | ICD-10-CM

## 2022-09-05 NOTE — Telephone Encounter (Signed)
Pt called and also needs a refill on her xanax as well

## 2022-09-06 ENCOUNTER — Other Ambulatory Visit: Payer: Self-pay

## 2022-09-06 ENCOUNTER — Other Ambulatory Visit: Payer: Self-pay | Admitting: Psychiatry

## 2022-09-06 DIAGNOSIS — G2581 Restless legs syndrome: Secondary | ICD-10-CM

## 2022-09-07 MED ORDER — ALPRAZOLAM 0.25 MG PO TABS
ORAL_TABLET | ORAL | 1 refills | Status: DC
Start: 2022-09-07 — End: 2022-09-23

## 2022-09-09 ENCOUNTER — Other Ambulatory Visit: Payer: Self-pay | Admitting: Psychiatry

## 2022-09-09 ENCOUNTER — Telehealth: Payer: Self-pay

## 2022-09-09 DIAGNOSIS — F339 Major depressive disorder, recurrent, unspecified: Secondary | ICD-10-CM

## 2022-09-09 MED ORDER — NORTRIPTYLINE HCL 50 MG PO CAPS
100.0000 mg | ORAL_CAPSULE | Freq: Every day | ORAL | 0 refills | Status: DC
Start: 2022-09-09 — End: 2022-09-23

## 2022-09-09 NOTE — Telephone Encounter (Signed)
Patient notified

## 2022-09-09 NOTE — Telephone Encounter (Signed)
Received a note from pharmacy that said patient is on 100 mg of nortriptyline and asking [place] to send Rx for 100 mg. Called patient and she said she thought she was supposed to be taking 4 tablets. The pramipexole had been increased to 4 every day.  Please verify the correct dose.   From 6/26 note:  nortriptyline 75 mg HS   You sent a RF on 7/22 for 75 mg.

## 2022-09-09 NOTE — Telephone Encounter (Signed)
Agreed to nortriptyline 100 mg HS, sent Rx as 50 mg cap, 2 nightly.

## 2022-09-15 ENCOUNTER — Other Ambulatory Visit: Payer: Self-pay | Admitting: Psychiatry

## 2022-09-15 DIAGNOSIS — F339 Major depressive disorder, recurrent, unspecified: Secondary | ICD-10-CM

## 2022-09-23 ENCOUNTER — Ambulatory Visit (INDEPENDENT_AMBULATORY_CARE_PROVIDER_SITE_OTHER): Payer: Medicare HMO | Admitting: Psychiatry

## 2022-09-23 ENCOUNTER — Encounter: Payer: Self-pay | Admitting: Psychiatry

## 2022-09-23 DIAGNOSIS — F339 Major depressive disorder, recurrent, unspecified: Secondary | ICD-10-CM

## 2022-09-23 DIAGNOSIS — G2581 Restless legs syndrome: Secondary | ICD-10-CM | POA: Diagnosis not present

## 2022-09-23 DIAGNOSIS — F900 Attention-deficit hyperactivity disorder, predominantly inattentive type: Secondary | ICD-10-CM

## 2022-09-23 MED ORDER — NORTRIPTYLINE HCL 50 MG PO CAPS
100.0000 mg | ORAL_CAPSULE | Freq: Every day | ORAL | 0 refills | Status: DC
Start: 2022-09-23 — End: 2022-11-27

## 2022-09-23 MED ORDER — PRAMIPEXOLE DIHYDROCHLORIDE 1 MG PO TABS
1.0000 mg | ORAL_TABLET | Freq: Four times a day (QID) | ORAL | 1 refills | Status: DC
Start: 2022-09-23 — End: 2022-09-23

## 2022-09-23 MED ORDER — METHYLPHENIDATE HCL ER (OSM) 54 MG PO TBCR
54.0000 mg | EXTENDED_RELEASE_TABLET | ORAL | 0 refills | Status: DC
Start: 2022-10-21 — End: 2022-11-27

## 2022-09-23 MED ORDER — PRAMIPEXOLE DIHYDROCHLORIDE 1 MG PO TABS
1.0000 mg | ORAL_TABLET | Freq: Four times a day (QID) | ORAL | 1 refills | Status: DC
Start: 2022-09-23 — End: 2022-09-24

## 2022-09-23 MED ORDER — METHYLPHENIDATE HCL ER (OSM) 54 MG PO TBCR
54.0000 mg | EXTENDED_RELEASE_TABLET | Freq: Every day | ORAL | 0 refills | Status: DC
Start: 2022-09-23 — End: 2022-09-23

## 2022-09-23 MED ORDER — ALPRAZOLAM 0.25 MG PO TABS
ORAL_TABLET | ORAL | 1 refills | Status: AC
Start: 2022-09-23 — End: ?

## 2022-09-23 MED ORDER — METHYLPHENIDATE HCL ER (OSM) 54 MG PO TBCR
54.0000 mg | EXTENDED_RELEASE_TABLET | Freq: Every day | ORAL | 0 refills | Status: DC
Start: 2022-09-23 — End: 2022-09-24

## 2022-09-23 MED ORDER — METHYLPHENIDATE HCL ER (OSM) 54 MG PO TBCR
54.0000 mg | EXTENDED_RELEASE_TABLET | ORAL | 0 refills | Status: DC
Start: 2022-11-18 — End: 2023-02-04

## 2022-09-23 NOTE — Progress Notes (Signed)
Rachael Callahan [id number] [birth year] 77 y.o.  Subjective:   Patient ID:  Rachael Callahan is a 77 y.o. (DOB [birth year]) female.  Chief Complaint:  Chief Complaint  Patient presents with   Follow-up   Depression   Medication Reaction    Depression        Associated symptoms include no decreased concentration and no suicidal ideas.  Rachael Callahan presents to the office today for follow-up of TRD.  Prior addition of Concerta had helped with depression and productivity and interest and alertness.  visit August 2020.  She was doing generally well and no meds were changed.  03/2019 appt with the following noted: BP up here but reports it has been normal at home.   No new problems.  Still good On Concerta to 54 mg daily and duloxetine 120 mg since Jan 2020.  Has been doing well.  Overall the combination of meds is effective.  More productive and more interested and no longer depressed.  No longer napping.   Busy summer.  D in law brought 2 gkids from [place] and that was great.  GS 8 years older than adopted Rachael Callahan and they do well together. Thinks TMS helped some also. RLS managed usually with daily pramipexole. Xanax use a couple times per month. Often over concerns with grand-daughter's mother who causes problems. Minimal anxiety .  Sleep good and less napping.  Patient reports stable mood and denies depressed or irritable moods.  Patient denies any recent difficulty with anxiety.  Patient denies difficulty with sleep initiation or maintenance. Night owl and gets 7-8 hours.  No napping.   Denies appetite disturbance.  Patient reports that energy and motivation have been good.  Patient denies any difficulty with concentration.  Patient denies any suicidal ideation. Concerned about Rachael Callahan who's not doing well with psych treatment.  Had seen Rachael Callahan before but transferred.  Rachael Callahan Bipolar disorder dx.  Seen at [facility].  Also had hosp.  Plan:  No meds changed  10/11/19 appt with the following  noted: Covid free.  Vaccinated. Pretty good mood. Satisfied with meds otherwise. Last week problems with RLS after being stable for awhile.  No more caffeine than usual. No SE. Patient reports stable mood and denies depressed or irritable moods.  Patient denies any recent difficulty with anxiety.  Patient denies difficulty with sleep initiation or maintenance. Denies appetite disturbance.  Patient reports that energy and motivation have been good.  Patient denies any difficulty with concentration.  Patient denies any suicidal ideation. Uses Xanax occ. [name] well. Rachael Callahan 27 and needs for insurance to see doc in [facility]. Plan: no changes  04/11/2020 appointment with the following noted: Since here concerta increased to 72 mg AM.  I think it's what I needed.  More motivated and energetic and productive but not normal. Still on duloxetine 120 without SE.   Mood good.  Patient reports stable mood and denies depressed or irritable moods.  Patient denies any recent difficulty with anxiety.  Patient denies difficulty with sleep initiation or maintenance, but night owl so sleep is messed up with retirement. Denies appetite disturbance.  Patient reports that energy and motivation have been good.  Patient denies any difficulty with concentration.  Patient denies any suicidal ideation. RLS managed.   Plan :  No med changes and she agrees.  10/11/2020 appointment with the following noted: Xanax 3-4 times per month. RLS managed with pramipexole and muscle relaxing cream. No sig depression.  Satisfied with meds.  Occ  situational mood problems. Patient reports stable mood and denies depressed or irritable moods.  Patient denies any recent difficulty with anxiety.  Patient denies difficulty with sleep initiation or maintenance. Denies appetite disturbance.  Patient reports that energy and motivation have been good.  Patient denies any difficulty with concentration.  Patient denies any suicidal  ideation. Stress Rachael Callahan married last year to cocaine addict. No SE Initial insomnia at night but can sleep better in the day. 1 cup AM coffee. Plan: No med changes  02/26/2021 phone call from husband and also discussed with patient.  MD note as follows: RTC  [phone] Rachael Callahan She's been in bed for a couple of days and doesn't feel life is worth living.  Not directly voiced SI.  Only like this for a couple of days.  She doesn't desire to go anywhere.  He knows of no trigger except ongoing family issues.  Disc with pt Felt bad for a couple of mos after virus in December.  Not Covid.  Cough ongoing.  Don't feel like doing anything.  No energy.  No interest.  Everything seems like too much to deal with.  No med changes. No SI but feels useless.  Seemed OK prior to virus. Change in Concerta recently about a week or 2 ago.  Plan: Would prefer something that could have a fairly quick effect and not have to change the Cymbalta given her history of doing well on it until recently. She did not respond to Abilify or Rexulti but she might respond to Vraylar 1.5 mg every morning.  It is quick-acting and compatible with her duloxetine and Concerta.  Disc discussed side effects.  She is not suicidal and does not need hospitalization at this time. Her husband will come pick up samples of Vraylar 1.5 mg 1 every morning starting tomorrow.  Rachael Staggers, MD, [facility]  04/30/2021 appointment with the following noted: Several phone calls since she was here. Took Vryalar for a week and saw benefit witout SE and then stopped it. Felt fine for awhile.  Then had hard time last weekend and took another Vraylar bc felt weepy. Today feels fine with mood..  Generally low anxiety. Rare alprazolam. Taking generic Concerta without a problem. No SE. Not much dep in last 6 mos except as noted. Rachael Callahan pregnant with first Rachael Callahan.  Concerned about Rachael Callahan physical's and mental health. Rachael Callahan married.   Rachael Callahan sober. Plan: continue Concerta 72,  duloxetine 120  05/21/21 took Vraylar again for depression and felt better and rX sent.  Cost was a problem so PT assistance forms mailed to her.  07/12/21 TC complaining of sleepiness and relating it to Vraylar.  Dose reduced to QOD  08/01/21 No show  10/03/21 TC complaining of depression. Sent Rx mirtazapine 30 mg HS.  Still taking Vraylar 1.5 QOD.  Was told to stop it since it was apparently no lonnger helping  12/10/21 appt noted:  seen with Rachael Callahan A lot of phone calls since here with depressive sx. Current psych meds: duloxetine 90, lithium 300, Ritalin 72 AM, mirtazapine 30. Depression worse in the last couple of week. SE lithium tremor and jittery.  Therefore needing alprazolam more often. Last free of dep about 6 weeks ago. Sleeping too much.  Sleep to escape depression.   Asks about Zoloft.   Plan: Plan: Stop mirtazapine and lithium Reduce duloxetine to 60 mg daily for 1 week then 30 mg daily for 1 week then stop duloxetine Start Auvelity 1 in the AM for  1 week then 1 twice daily as long as dizziness is not a problem.  02/24/22 appt noted: Several phone calls since she was here.  Jittery on Cyrus and stopped it. Referral sent for TMS. Then restarted Auvelity and wanted a prescription February 07, 2022 Taking Auvelity twice daily since here.   Meds: Auvelity BID (AM & HS), Concerta 72 AM, pramipexole 1 mg HS.  No SE Didn't notice anything profound coming off other meds but does feel better generally.  Doesn't feel worse.  Stressful family sensation. Feels a little odd sometimes like I'm not totally engaged and don't have energy she wisshes long term.  Depression is better but not gone. Sleep is good.  Appetite good. No sig RLS with pramipexole. Planning to move to where D lives  in [facility] and build house. Enjoys theater and music. Plan: continue Auvelity BID, Concerta 36 AM, pramipexole 1 mg HS Still being evaluated for TMS  04/29/22 appt noted: Several phone calls since  here with various problems. Weaning TMS  and maybe somewhat helpful. Still so low energy.  Days of feeling sad withotu reason.  No recent crying spells-TMS helped.  Better motivation later in the day.  Not much interest or enjoyment.   No change off duloxetine and on Auvelity. No SE now.   Plan: Plan: DT NR reduce Concerta 36 AM,  Off label for TRD increase pramipexole to 0.5 mg AM and 1 mg HS Tapering TMS DC  AUVELITY Start TCA nortriptyline 25 up to 75 mg HS  05/08/22 TC no benefit pramipexole Plan:  Increase pramipexole to 1 mg BID if tolerated if she has SE then split the AM dose to 1/2 in the AM and noon and 1 at night.     06/12/22 appt noted: Meds; concerta 36 AM, nortriptyline 75 HS, pramipexole 1 mg BID. Finished TMS end of April Seemed to level out with meds.  Not severe downs or crying in the last couple of weeks.  Tire easily.  Motivation so so.  A little better interest and enjoyment.  Not required to do much. Dep 4/10.   No SE noted except dry mouth.  No N or dizziness.   Needs to lose wt.   No RLS.   Sleep ok.   Plan: continue Concerta 36 AM,  Off label for TRD increase pramipexole to 1 mg TID  nortriptyline 75 mg HS  07/23/22 appt noted: Finished TMS end of April 2024 Meds as above. No SE except dry mouth.  No sleepiness or N or revved up. No benefit with increase pramipexole.  Wants to feel better.  Andhedonia.  Didn't enjoy beach trip and should have.  Occ crying spells.    Good relationship with D and son in law.  Moving to live with them in [facility].  Looking forward to it. No RLS. Pending knee surgery 08/26/22 Sleep ok.   Plan: Off label for TRD increase pramipexole to 1 mg QID DT NR or SE If NR in 2 weeks then call and will increase to max 5 mg daily. Check nortriptyline level .    07/28/22  Amitriptyline Not Estab. ng/mL <20    Nortriptyline Not Estab. ng/mL 89  Amitriptyline & Nortriptyline, Total 80 - 200 ng/mL   Total <109 Plan increase to 100  mg HS from 75 mg HS  09/23/22 appt noted; Psych med; Xanax 0.25 mg prn, Concerta 54 AM, nortrip 100 HS, pramipexole 1 mg QID,  SE dryness Had TKR on R 08/26/22.  No opiates. Feeling better but not sure why.  Pretty quickly after here.  Overall mood is better.  Dep 1-2/10 is clearly better.  Can enjoy some things esp great grand babies.   Rachael Callahan Ariel has baby Iris.  Doing well.   Asks about incr prn Xanax.  Needs some to help with sleep bc awakening for an hour.  Some related to knee discomfort. Rachael Callahan just retired.  Plan to sell house and move to [facility] where D located.   Psychiatric medications tried include  fluoxetine,  venlafaxine,  duloxetine 120 + Concerta 54, bupropion,  Trintellix, lithium,  Mirtazapine 30 without help for depression Auvelity NR Nortriptyline 100  pramipexole 1 QID  Latuda 40 mg,  Rexulti 2 mg with no benefit,  Vraylar 1.5 had benefit and then lost it.  TMS helped, 2019  ; repeat 04/2022  Review of Systems:  Review of Systems  Cardiovascular:  Negative for chest pain and palpitations.  Musculoskeletal:  Positive for arthralgias.  Neurological:  Negative for dizziness and tremors.       RLS off and on  Psychiatric/Behavioral:  Positive for dysphoric mood. Negative for agitation, behavioral problems, confusion, decreased concentration, hallucinations, self-injury, sleep disturbance and suicidal ideas. The patient is not nervous/anxious and is not hyperactive.     Medications: I have reviewed the patient's current medications.  Current Outpatient Medications  Medication Sig Dispense Refill   aspirin 81 MG chewable tablet Chew 1 tablet (81 mg total) by mouth 2 (two) times daily for 28 days. 56 tablet 0   Cholecalciferol 125 MCG (5000 UT) TABS Take 5,000 Units by mouth daily.     methocarbamol (ROBAXIN) 500 MG tablet Take 1 tablet (500 mg total) by mouth every 6 (six) hours as needed for muscle spasms (muscle pain). 40 tablet 2   [START ON 10/21/2022]  methylphenidate (CONCERTA) 54 MG PO CR tablet Take 1 tablet (54 mg total) by mouth every morning. 30 tablet 0   [START ON 11/18/2022] methylphenidate (CONCERTA) 54 MG PO CR tablet Take 1 tablet (54 mg total) by mouth every morning. 30 tablet 0   metoprolol succinate (TOPROL-XL) 50 MG 24 hr tablet Take 50 mg by mouth daily.     Multiple Vitamins-Minerals (MULTIVITAMIN ADULT PO) Take 1 tablet by mouth daily.     olmesartan (BENICAR) 40 MG tablet Take 40 mg by mouth daily.     omeprazole (PRILOSEC) 40 MG capsule Take 40 mg by mouth daily.  1   polyethylene glycol (MIRALAX / GLYCOLAX) 17 g packet Take 17 g by mouth 2 (two) times daily. 14 each 0   pravastatin (PRAVACHOL) 20 MG tablet Take 20 mg by mouth at bedtime.  3   Probiotic Product (PROBIOTIC-10 PO) Take 1 tablet by mouth daily.     ALPRAZolam (XANAX) 0.25 MG tablet TAKE 1 TABLETS EVERYDAY AS NEEDED ANXIETY and 1 at night if needed for sleep 40 tablet 1   methylphenidate 54 MG PO CR tablet Take 1 tablet (54 mg total) by mouth daily. 30 tablet 0   nortriptyline (PAMELOR) 50 MG capsule Take 2 capsules (100 mg total) by mouth at bedtime. 180 capsule 0   oxyCODONE (OXY IR/ROXICODONE) 5 MG immediate release tablet Take 1 tablet (5 mg total) by mouth every 4 (four) hours as needed for severe pain. (Patient not taking: Reported on 09/23/2022) 42 tablet 0   pramipexole (MIRAPEX) 1 MG tablet Take 1 tablet (1 mg total) by mouth 4 (four) times daily. 120 tablet 1  No current facility-administered medications for this visit.    Medication Side Effects: None  Allergies: No Known Allergies  Past Medical History:  Diagnosis Date   Arthritis    Depression    High cholesterol    Hypertension    Migraine    Stomach ulcer     History reviewed. No pertinent family history.  Social History   Socioeconomic History   Marital status: Married    Spouse name: Not on file   Number of children: Not on file   Years of education: Not on file   Highest  education level: Not on file  Occupational History   Not on file  Tobacco Use   Smoking status: Never   Smokeless tobacco: Never  Vaping Use   Vaping status: Never Used  Substance and Sexual Activity   Alcohol use: Yes    Comment: rarely   Drug use: Never   Sexual activity: Not on file  Other Topics Concern   Not on file  Social History Narrative   Not on file   Social Determinants of Health   Financial Resource Strain: Not on file  Food Insecurity: No Food Insecurity (08/26/2022)   Hunger Vital Sign    Worried About Running Out of Food in the Last Year: Never true    Ran Out of Food in the Last Year: Never true  Transportation Needs: No Transportation Needs (08/26/2022)   PRAPARE - Administrator, Civil Service (Medical): No    Lack of Transportation (Non-Medical): No  Physical Activity: Not on file  Stress: Not on file  Social Connections: Not on file  Intimate Partner Violence: Not At Risk (08/26/2022)   Humiliation, Afraid, Rape, and Kick questionnaire    Fear of Current or Ex-Partner: No    Emotionally Abused: No    Physically Abused: No    Sexually Abused: No    Past Medical History, Surgical history, Social history, and Family history were reviewed and updated as appropriate.   Please see review of systems for further details on the patient's review from today.   Objective:   Physical Exam:  There were no vitals taken for this visit.  Physical Exam Constitutional:      General: She is not in acute distress.    Appearance: She is well-developed.  Musculoskeletal:        General: No deformity.  Neurological:     Mental Status: She is alert and oriented to person, place, and time.     Motor: No tremor.     Coordination: Coordination normal.     Gait: Gait normal.  Psychiatric:        Attention and Perception: Perception normal. She is attentive.        Mood and Affect: Mood is depressed. Mood is not anxious. Affect is blunt. Affect is not  labile, angry or inappropriate.        Speech: Speech normal.        Behavior: Behavior normal.        Thought Content: Thought content normal. Thought content is not delusional. Thought content does not include homicidal or suicidal ideation. Thought content does not include suicidal plan.        Cognition and Memory: Cognition normal.        Judgment: Judgment normal.     Comments: Insight intact. No auditory or visual hallucinations. No delusions.  Talkative without pressure Much better with depression almost gone      Lab Review:  Component Value Date/Time   NA 136 08/27/2022 0410   K 4.5 08/27/2022 0410   CL 103 08/27/2022 0410   CO2 25 08/27/2022 0410   GLUCOSE 182 (H) 08/27/2022 0410   BUN 13 08/27/2022 0410   CREATININE 0.74 08/27/2022 0410   CALCIUM 8.7 (L) 08/27/2022 0410   GFRNONAA >60 08/27/2022 0410   GFRAA >60 05/14/2017 0917       Component Value Date/Time   WBC 9.9 08/27/2022 0410   RBC 3.78 (L) 08/27/2022 0410   HGB 11.7 (L) 08/27/2022 0410   HCT 35.9 (L) 08/27/2022 0410   PLT 205 08/27/2022 0410   MCV 95.0 08/27/2022 0410   MCH 31.0 08/27/2022 0410   MCHC 32.6 08/27/2022 0410   RDW 13.0 08/27/2022 0410   LYMPHSABS 1.4 05/14/2017 0917   MONOABS 0.5 05/14/2017 0917   EOSABS 0.2 05/14/2017 0917   BASOSABS 0.0 05/14/2017 0917    No results found for: "POCLITH", "LITHIUM"   No results found for: "PHENYTOIN", "PHENOBARB", "VALPROATE", "CBMZ"   05/07/22 ONLY ON NORTRIPTYLINE A FEW DAYS: NORTRIP level 70  .res Assessment: Plan:    Aurie was seen today for follow-up, depression and medication reaction.  Diagnoses and all orders for this visit:  Recurrent major depression resistant to treatment (HCC) -     Discontinue: methylphenidate 54 MG PO CR tablet; Take 1 tablet (54 mg total) by mouth daily. -     nortriptyline (PAMELOR) 50 MG capsule; Take 2 capsules (100 mg total) by mouth at bedtime. -     Discontinue: pramipexole (MIRAPEX) 1 MG  tablet; Take 1 tablet (1 mg total) by mouth 4 (four) times daily. -     methylphenidate 54 MG PO CR tablet; Take 1 tablet (54 mg total) by mouth daily. -     methylphenidate (CONCERTA) 54 MG PO CR tablet; Take 1 tablet (54 mg total) by mouth every morning. -     methylphenidate (CONCERTA) 54 MG PO CR tablet; Take 1 tablet (54 mg total) by mouth every morning. -     pramipexole (MIRAPEX) 1 MG tablet; Take 1 tablet (1 mg total) by mouth 4 (four) times daily.  Attention deficit hyperactivity disorder (ADHD), predominantly inattentive type  Restless legs syndrome -     ALPRAZolam (XANAX) 0.25 MG tablet; TAKE 1 TABLETS EVERYDAY AS NEEDED ANXIETY and 1 at night if needed for sleep -     Discontinue: pramipexole (MIRAPEX) 1 MG tablet; Take 1 tablet (1 mg total) by mouth 4 (four) times daily. -     pramipexole (MIRAPEX) 1 MG tablet; Take 1 tablet (1 mg total) by mouth 4 (four) times daily.   30 min face to face time with patient was spent on counseling and coordination of care.  Recent severe depression in Jan 2023 getting better then worse.  But has had a number of bouts of depression in 2023.   Overall seems to have high tolerance of meds.    Discussed potential benefits, risks, and side effects of stimulants with patient to include increased heart rate, palpitations, insomnia, increased anxiety, increased irritability, or decreased appetite.  Instructed patient to contact office if experiencing any significant tolerability issues.  Disc SE including rare risks of compulsive behaviors with pramipexole.  Disc dosing range from off label TRD studies 1-5 mg daily.    Extensive discussion of tx options including repeating TMS, Spravato, TCA.  , MAOI If plan fails then selegiline.   Disc MAOI restrictions.    Answered questions about reasons  not to chose another sSRI, SNRI  Disc SE each type of med including manic type sx.  Plan: continue Concerta 36 AM,  nortriptyline 100 mg HS  Off label for  TRD increase pramipexole to 1 mg QID helpful  Extensive disc of alternative Spravato.  Checked nortriptyline level on 75 mg HS.  Then increased to 100 mg HS. 07/28/22  Amitriptyline Not Estab. ng/mL <20    Nortriptyline Not Estab. ng/mL 89  Amitriptyline & Nortriptyline, Total 80 - 200 ng/mL   Total <109  No med changes today; Concerta 36 AM, nortrip 100 HS, pramipexole 1 QID, Xanax 0.25 mg HS.  FU 8 weeks  Rachael Staggers, MD, [facility]AP[facility]   No future appointments.    No orders of the defined types were placed in this encounter.     -------------------------------

## 2022-09-24 ENCOUNTER — Other Ambulatory Visit: Payer: Self-pay

## 2022-09-24 DIAGNOSIS — F339 Major depressive disorder, recurrent, unspecified: Secondary | ICD-10-CM

## 2022-09-24 DIAGNOSIS — G2581 Restless legs syndrome: Secondary | ICD-10-CM

## 2022-09-24 MED ORDER — PRAMIPEXOLE DIHYDROCHLORIDE 1 MG PO TABS
1.0000 mg | ORAL_TABLET | Freq: Four times a day (QID) | ORAL | 1 refills | Status: DC
Start: 2022-09-24 — End: 2022-11-13

## 2022-09-24 MED ORDER — METHYLPHENIDATE HCL ER (OSM) 54 MG PO TBCR
54.0000 mg | EXTENDED_RELEASE_TABLET | Freq: Every day | ORAL | 0 refills | Status: DC
Start: 2022-09-24 — End: 2022-11-27

## 2022-10-02 ENCOUNTER — Telehealth: Payer: Self-pay | Admitting: Psychiatry

## 2022-10-02 NOTE — Telephone Encounter (Signed)
Patient lvm for Clarisse Gouge stating the Alprazolam Rx directions are incorrect. She stated this has been a continuous issue for the pharmacy due to her taken more using them also for sleep. Joella stated if there is an alternative to aid in her sleep she would like to discuss that.  Contact # [phone]

## 2022-10-03 NOTE — Telephone Encounter (Signed)
LVM to RC 

## 2022-10-06 NOTE — Telephone Encounter (Signed)
Rx was sent in for #40 alprazolam at her 8/27 appt. She is still worried about needing to take 2 a day and running out early. I told her Rx was written as needed. She reported last night she didn't need alprazolam for sleep. Told her if she feels like she needs more than prescribed she needs to revisit with Dr. Jennelle Human and maybe another medication change needs to be made.

## 2022-10-27 ENCOUNTER — Telehealth: Payer: Self-pay | Admitting: Psychiatry

## 2022-10-27 ENCOUNTER — Other Ambulatory Visit: Payer: Self-pay

## 2022-10-27 DIAGNOSIS — G2581 Restless legs syndrome: Secondary | ICD-10-CM

## 2022-10-27 MED ORDER — ALPRAZOLAM 0.25 MG PO TABS
ORAL_TABLET | ORAL | 0 refills | Status: DC
Start: 2022-10-27 — End: 2022-11-27

## 2022-10-27 NOTE — Telephone Encounter (Signed)
Pended.

## 2022-10-27 NOTE — Telephone Encounter (Signed)
Patient lvm at 11:03 stating that she needs prescription for Alprazolam 0.25mg . " Has taken more now than in the past for anxiety an sleep." Ph: [phone] Appt 10/30

## 2022-10-28 ENCOUNTER — Other Ambulatory Visit: Payer: Self-pay | Admitting: Psychiatry

## 2022-10-28 NOTE — Telephone Encounter (Signed)
RF inappropriate dt discontinuing 04/29/22; no mention in previous notes about restarting Mirtazapine. No patient calls requesting to restart Mirtazapine. Medication not on medication list.

## 2022-11-13 ENCOUNTER — Other Ambulatory Visit: Payer: Self-pay | Admitting: Psychiatry

## 2022-11-13 DIAGNOSIS — G2581 Restless legs syndrome: Secondary | ICD-10-CM

## 2022-11-13 DIAGNOSIS — F339 Major depressive disorder, recurrent, unspecified: Secondary | ICD-10-CM

## 2022-11-13 NOTE — Telephone Encounter (Signed)
LF 08/28; LV 08/27; NV 10/30

## 2022-11-26 ENCOUNTER — Ambulatory Visit: Payer: Medicare HMO | Admitting: Psychiatry

## 2022-11-27 ENCOUNTER — Encounter: Payer: Self-pay | Admitting: Psychiatry

## 2022-11-27 ENCOUNTER — Ambulatory Visit (INDEPENDENT_AMBULATORY_CARE_PROVIDER_SITE_OTHER): Payer: Medicare HMO | Admitting: Psychiatry

## 2022-11-27 DIAGNOSIS — G2581 Restless legs syndrome: Secondary | ICD-10-CM

## 2022-11-27 DIAGNOSIS — F339 Major depressive disorder, recurrent, unspecified: Secondary | ICD-10-CM | POA: Diagnosis not present

## 2022-11-27 DIAGNOSIS — F900 Attention-deficit hyperactivity disorder, predominantly inattentive type: Secondary | ICD-10-CM | POA: Diagnosis not present

## 2022-11-27 MED ORDER — PRAMIPEXOLE DIHYDROCHLORIDE ER 4.5 MG PO TB24
1.0000 | ORAL_TABLET | Freq: Every day | ORAL | 1 refills | Status: DC
Start: 2022-11-27 — End: 2022-12-22

## 2022-11-27 MED ORDER — METHYLPHENIDATE HCL ER (OSM) 54 MG PO TBCR: 54.0000 mg | EXTENDED_RELEASE_TABLET | Freq: Every day | ORAL | 0 refills | Status: DC

## 2022-11-27 MED ORDER — METHYLPHENIDATE HCL ER (OSM) 54 MG PO TBCR: 54.0000 mg | EXTENDED_RELEASE_TABLET | ORAL | 0 refills | Status: DC

## 2022-11-27 MED ORDER — ALPRAZOLAM 0.25 MG PO TABS: ORAL_TABLET | ORAL | 0 refills | Status: DC

## 2022-11-27 MED ORDER — NORTRIPTYLINE HCL 50 MG PO CAPS: 100.0000 mg | ORAL_CAPSULE | Freq: Every day | ORAL | 0 refills | Status: DC

## 2022-11-27 NOTE — Progress Notes (Signed)
Rachael Callahan [id number] 1945-08-06 77 y.o.  Subjective:   Patient ID:  Rachael Callahan is a 77 y.o. (DOB [birth year]) female.  Chief Complaint:  Chief Complaint  Patient presents with   Follow-up   Depression   Fatigue    Depression        Associated symptoms include no decreased concentration and no suicidal ideas.  Rachael Callahan presents to the office today for follow-up of TRD.  Prior addition of Concerta had helped with depression and productivity and interest and alertness.  visit August 2020.  She was doing generally well and no meds were changed.  03/2019 appt with the following noted: BP up here but reports it has been normal at home.   No new problems.  Still good On Concerta to 54 mg daily and duloxetine 120 mg since Jan 2020.  Has been doing well.  Overall the combination of meds is effective.  More productive and more interested and no longer depressed.  No longer napping.   Busy summer.  D in law brought 2 gkids from [place] and that was great.  GS 8 years older than adopted GD and they do well together. Thinks TMS helped some also. RLS managed usually with daily pramipexole. Xanax use a couple times per month. Often over concerns with grand-daughter's mother who causes problems. Minimal anxiety .  Sleep good and less napping.  Patient reports stable mood and denies depressed or irritable moods.  Patient denies any recent difficulty with anxiety.  Patient denies difficulty with sleep initiation or maintenance. Night owl and gets 7-8 hours.  No napping.   Denies appetite disturbance.  Patient reports that energy and motivation have been good.  Patient denies any difficulty with concentration.  Patient denies any suicidal ideation. Concerned about GD who's not doing well with psych treatment.  Had seen Rachael Callahan before but transferred.  GD Bipolar disorder dx.  Se[facility] at [facility].  Also had hosp.  Plan:  No meds changed  10/11/19 appt with the following noted: Covid  free.  Vaccinated. Pretty good mood. Satisfied with meds otherwise. Last week problems with RLS after being stable for awhile.  No more caffeine than usual. No SE. Patient reports stable mood and denies depressed or irritable moods.  Patient denies any recent difficulty with anxiety.  Patient denies difficulty with sleep initiation or maintenance. Denies appetite disturbance.  Patient reports that energy and motivation have been good.  Patient denies any difficulty with concentration.  Patient denies any suicidal ideation. Uses Xanax occ. [name] well. GD 26 and needs for insurance to see doc in [facility]. Plan: no changes  04/11/2020 appointment with the following noted: Since here concerta increased to 72 mg AM.  I think it's what I needed.  More motivated and energetic and productive but not normal. Still on duloxetine 120 without SE.   Mood good.  Patient reports stable mood and denies depressed or irritable moods.  Patient denies any recent difficulty with anxiety.  Patient denies difficulty with sleep initiation or maintenance, but night owl so sleep is messed up with retirement. Denies appetite disturbance.  Patient reports that energy and motivation have been good.  Patient denies any difficulty with concentration.  Patient denies any suicidal ideation. RLS managed.   Plan :  No med changes and she agrees.  10/11/2020 appointment with the following noted: Xanax 3-4 times per month. RLS managed with pramipexole and muscle relaxing cream. No sig depression.  Satisfied with meds.  Occ situational  mood problems. Patient reports stable mood and denies depressed or irritable moods.  Patient denies any recent difficulty with anxiety.  Patient denies difficulty with sleep initiation or maintenance. Denies appetite disturbance.  Patient reports that energy and motivation have been good.  Patient denies any difficulty with concentration.  Patient denies any suicidal ideation. Stress GD married  last year to cocaine addict. No SE Initial insomnia at night but can sleep better in the day. 1 cup AM coffee. Plan: No med changes  02/26/2021 phone call from husband and also discussed with patient.  MD note as follows: RTC  [phone] Rachael Callahan She's been in bed for a couple of days and doesn't feel life is worth living.  Not directly voiced SI.  Only like this for a couple of days.  She doesn't desire to go anywhere.  He knows of no trigger except ongoing family issues.  Disc with pt Felt bad for a couple of mos after virus in December.  Not Covid.  Cough ongoing.  Don't feel like doing anything.  No energy.  No interest.  Everything seems like too much to deal with.  No med changes. No SI but feels useless.  Seemed OK prior to virus. Change in Concerta recently about a week or 2 ago.  Plan: Would prefer something that could have a fairly quick effect and not have to change the Cymbalta given her history of doing well on it until recently. She did not respond to Abilify or Rexulti but she might respond to Vraylar 1.5 mg every morning.  It is quick-acting and compatible with her duloxetine and Concerta.  Disc discussed side effects.  She is not suicidal and does not need hospitalization at this time. Her husband will come pick up samples of Vraylar 1.5 mg 1 every morning starting tomorrow.  Rachael Staggers, MD, [facility]  04/30/2021 appointment with the following noted: Several phone calls since she was here. Took Vryalar for a week and saw benefit witout SE and then stopped it. Felt fine for awhile.  Then had hard time last weekend and took another Vraylar bc felt weepy. Today feels fine with mood..  Generally low anxiety. Rare alprazolam. Taking generic Concerta without a problem. No SE. Not much dep in last 6 mos except as noted. GD pregnant with first [name].  Concerned about GD physical's and mental health. GD married.   GD sober. Plan: continue Concerta 72, duloxetine 120  05/21/21  took Vraylar again for depression and felt better and rX sent.  Cost was a problem so PT assistance forms mailed to her.  07/12/21 TC complaining of sleepiness and relating it to Vraylar.  Dose reduced to QOD  08/01/21 No show  10/03/21 TC complaining of depression. Sent Rx mirtazapine 30 mg HS.  Still taking Vraylar 1.5 QOD.  Was told to stop it since it was apparently no lonnger helping  12/10/21 appt noted:  seen with Rachael Callahan A lot of phone calls since here with depressive sx. Current psych meds: duloxetine 90, lithium 300, Ritalin 72 AM, mirtazapine 30. Depression worse in the last couple of week. SE lithium tremor and jittery.  Therefore needing alprazolam more often. Last free of dep about 6 weeks ago. Sleeping too much.  Sleep to escape depression.   Asks about Zoloft.   Plan: Plan: Stop mirtazapine and lithium Reduce duloxetine to 60 mg daily for 1 week then 30 mg daily for 1 week then stop duloxetine Start Auvelity 1 in the AM for 1  week then 1 twice daily as long as dizziness is not a problem.  02/24/22 appt noted: Several phone calls since she was here.  Jittery on [place] and stopped it. Referral sent for TMS. Then restarted Auvelity and wanted a prescription February 07, 2022 Taking Auvelity twice daily since here.   Meds: Auvelity BID (AM & HS), Concerta 72 AM, pramipexole 1 mg HS.  No SE Didn't notice anything profound coming off other meds but does feel better generally.  Doesn't feel worse.  Stressful family sensation. Feels a little odd sometimes like I'm not totally engaged and don't have energy she wisshes long term.  Depression is better but not gone. Sleep is good.  Appetite good. No sig RLS with pramipexole. Planning to move to where D lives  in [facility] and build house. Enjoys theater and music. Plan: continue Auvelity BID, Concerta 36 AM, pramipexole 1 mg HS Still being evaluated for TMS  04/29/22 appt noted: Several phone calls since here with various  problems. Weaning TMS  and maybe somewhat helpful. Still so low energy.  Days of feeling sad withotu reason.  No recent crying spells-TMS helped.  Better motivation later in the day.  Not much interest or enjoyment.   No change off duloxetine and on Auvelity. No SE now.   Plan: Plan: DT NR reduce Concerta 36 AM,  Off label for TRD increase pramipexole to 0.5 mg AM and 1 mg HS Tapering TMS DC  AUVELITY Start TCA nortriptyline 25 up to 75 mg HS  05/08/22 TC no benefit pramipexole Plan:  Increase pramipexole to 1 mg BID if tolerated if she has SE then split the AM dose to 1/2 in the AM and noon and 1 at night.     06/12/22 appt noted: Meds; concerta 36 AM, nortriptyline 75 HS, pramipexole 1 mg BID. Finished TMS end of April Seemed to level out with meds.  Not severe downs or crying in the last couple of weeks.  Tire easily.  Motivation so so.  A little better interest and enjoyment.  Not required to do much. Dep 4/10.   No SE noted except dry mouth.  No N or dizziness.   Needs to lose wt.   No RLS.   Sleep ok.   Plan: continue Concerta 36 AM,  Off label for TRD increase pramipexole to 1 mg TID  nortriptyline 75 mg HS  07/23/22 appt noted: Finished TMS end of April 2024 Meds as above. No SE except dry mouth.  No sleepiness or N or revved up. No benefit with increase pramipexole.  Wants to feel better.  Andhedonia.  Didn't enjoy beach trip and should have.  Occ crying spells.    Good relationship with D and son in law.  Moving to live with them in [facility].  Looking forward to it. No RLS. Pending knee surgery 08/26/22 Sleep ok.   Plan: Off label for TRD increase pramipexole to 1 mg QID DT NR or SE If NR in 2 weeks then call and will increase to max 5 mg daily. Check nortriptyline level .    07/28/22  Amitriptyline Not Estab. ng/mL <20    Nortriptyline Not Estab. ng/mL 89  Amitriptyline & Nortriptyline, Total 80 - 200 ng/mL   Total <109 Plan increase to 100 mg HS from 75 mg  HS  09/23/22 appt noted; Psych med; Xanax 0.25 mg prn, Concerta 54 AM, nortrip 100 HS, pramipexole 1 mg QID,  SE dryness Had TKR on R 08/26/22.  No opiates. Feeling better but not sure why.  Pretty quickly after here.  Overall mood is better.  Dep 1-2/10 is clearly better.  Can enjoy some things esp great grand babies.   GD Ariel has baby Iris.  Doing well.   Asks about incr prn Xanax.  Needs some to help with sleep bc awakening for an hour.  Some related to knee discomfort. Rachael Callahan just retired.  Plan to sell house and move to [facility] where D located.  Plan: Plan: continue Concerta 36 AM,  nortriptyline 100 mg HS Off label for TRD increase pramipexole to 1 mg QID helpful  11/27/22 appt noted: Psych med; Xanax 0.25 mg prn, Concerta 54 AM, nortrip 100 HS, pramipexole 1 mg QID(missing some),  SE dryness Still dealing with issues with TKR.  Doesn't think she is doing well enough.  Will have to go back to PT.  It is limiting activity and movement.  Gained 12 # since surgery.   Mood so so , not great.   Working to put house on [employer].  Preparing for this has been difficult.   H has good attitude.   Asked about therapy  Psychiatric medications tried include  fluoxetine,  venlafaxine,  duloxetine 120 + Concerta 54, bupropion,  Trintellix, lithium,  Mirtazapine 30 without help for depression Auvelity NR Nortriptyline 100  pramipexole 1 QID  Latuda 40 mg,  Rexulti 2 mg with no benefit,  Vraylar 1.5 had benefit and then lost it.  TMS helped, 2019  ; repeat 04/2022  Review of Systems:  Review of Systems  Cardiovascular:  Negative for chest pain and palpitations.  Musculoskeletal:  Positive for arthralgias.  Neurological:  Negative for dizziness and tremors.       RLS off and on  Psychiatric/Behavioral:  Positive for dysphoric mood. Negative for agitation, behavioral problems, confusion, decreased concentration, hallucinations, self-injury, sleep disturbance and suicidal ideas. The  patient is not nervous/anxious and is not hyperactive.     Medications: I have reviewed the patient's current medications.  Current Outpatient Medications  Medication Sig Dispense Refill   Cholecalciferol 125 MCG (5000 UT) TABS Take 5,000 Units by mouth daily.     methocarbamol (ROBAXIN) 500 MG tablet Take 1 tablet (500 mg total) by mouth every 6 (six) hours as needed for muscle spasms (muscle pain). 40 tablet 2   methylphenidate (CONCERTA) 54 MG PO CR tablet Take 1 tablet (54 mg total) by mouth every morning. 30 tablet 0   metoprolol succinate (TOPROL-XL) 50 MG 24 hr tablet Take 50 mg by mouth daily.     Multiple Vitamins-Minerals (MULTIVITAMIN ADULT PO) Take 1 tablet by mouth daily.     olmesartan (BENICAR) 40 MG tablet Take 40 mg by mouth daily.     omeprazole (PRILOSEC) 40 MG capsule Take 40 mg by mouth daily.  1   oxyCODONE (OXY IR/ROXICODONE) 5 MG immediate release tablet Take 1 tablet (5 mg total) by mouth every 4 (four) hours as needed for severe pain. 42 tablet 0   polyethylene glycol (MIRALAX / GLYCOLAX) 17 g packet Take 17 g by mouth 2 (two) times daily. 14 each 0   Pramipexole Dihydrochloride 4.5 MG TB24 Take 1 tablet (4.5 mg total) by mouth daily. 30 tablet 1   pravastatin (PRAVACHOL) 20 MG tablet Take 20 mg by mouth at bedtime.  3   Probiotic Product (PROBIOTIC-10 PO) Take 1 tablet by mouth daily.     ALPRAZolam (XANAX) 0.25 MG tablet TAKE 1 TABLETS EVERYDAY AS  NEEDED ANXIETY and 1 at night if needed for sleep 40 tablet 0   [START ON 12/25/2022] methylphenidate (CONCERTA) 54 MG PO CR tablet Take 1 tablet (54 mg total) by mouth every morning. 30 tablet 0   methylphenidate 54 MG PO CR tablet Take 1 tablet (54 mg total) by mouth daily. 30 tablet 0   nortriptyline (PAMELOR) 50 MG capsule Take 2 capsules (100 mg total) by mouth at bedtime. 180 capsule 0   No current facility-administered medications for this visit.    Medication Side Effects: None  Allergies: No Known  Allergies  Past Medical History:  Diagnosis Date   Arthritis    Depression    High cholesterol    Hypertension    Migraine    Stomach ulcer     History reviewed. No pertinent family history.  Social History   Socioeconomic History   Marital status: Married    Spouse name: Not on file   Number of children: Not on file   Years of education: Not on file   Highest education level: Not on file  Occupational History   Not on file  Tobacco Use   Smoking status: Never   Smokeless tobacco: Never  Vaping Use   Vaping status: Never Used  Substance and Sexual Activity   Alcohol use: Yes    Comment: rarely   Drug use: Never   Sexual activity: Not on file  Other Topics Concern   Not on file  Social History Narrative   Not on file   Social Determinants of Health   Financial Resource Strain: Not on file  Food Insecurity: No Food Insecurity (08/26/2022)   Hunger Vital Sign    Worried About Running Out of Food in the Last Year: Never true    Ran Out of Food in the Last Year: Never true  Transportation Needs: No Transportation Needs (08/26/2022)   PRAPARE - Administrator, Civil Service (Medical): No    Lack of Transportation (Non-Medical): No  Physical Activity: Not on file  Stress: Not on file  Social Connections: Not on file  Intimate Partner Violence: Not At Risk (08/26/2022)   Humiliation, Afraid, Rape, and Kick questionnaire    Fear of Current or Ex-Partner: No    Emotionally Abused: No    Physically Abused: No    Sexually Abused: No    Past Medical History, Surgical history, Social history, and Family history were reviewed and updated as appropriate.   Please see review of systems for further details on the patient's review from today.   Objective:   Physical Exam:  There were no vitals taken for this visit.  Physical Exam Constitutional:      General: She is not in acute distress.    Appearance: She is well-developed.  Musculoskeletal:         General: No deformity.  Neurological:     Mental Status: She is alert and oriented to person, place, and time.     Motor: No tremor.     Coordination: Coordination normal.     Gait: Gait normal.  Psychiatric:        Attention and Perception: Perception normal. She is attentive.        Mood and Affect: Mood is depressed. Mood is not anxious. Affect is blunt. Affect is not labile, angry or inappropriate.        Speech: Speech normal.        Behavior: Behavior normal. Behavior is not slowed.  Thought Content: Thought content normal. Thought content is not delusional. Thought content does not include homicidal or suicidal ideation. Thought content does not include suicidal plan.        Cognition and Memory: Cognition normal.        Judgment: Judgment normal.     Comments: Insight intact. No auditory or visual hallucinations. No delusions.  Talkative without pressure Much better with depression almost gone      Lab Review:     Component Value Date/Time   NA 136 08/27/2022 0410   K 4.5 08/27/2022 0410   CL 103 08/27/2022 0410   CO2 25 08/27/2022 0410   GLUCOSE 182 (H) 08/27/2022 0410   BUN 13 08/27/2022 0410   CREATININE 0.74 08/27/2022 0410   CALCIUM 8.7 (L) 08/27/2022 0410   GFRNONAA >60 08/27/2022 0410   GFRAA >60 05/14/2017 0917       Component Value Date/Time   WBC 9.9 08/27/2022 0410   RBC 3.78 (L) 08/27/2022 0410   HGB 11.7 (L) 08/27/2022 0410   HCT 35.9 (L) 08/27/2022 0410   PLT 205 08/27/2022 0410   MCV 95.0 08/27/2022 0410   MCH 31.0 08/27/2022 0410   MCHC 32.6 08/27/2022 0410   RDW 13.0 08/27/2022 0410   LYMPHSABS 1.4 05/14/2017 0917   MONOABS 0.5 05/14/2017 0917   EOSABS 0.2 05/14/2017 0917   BASOSABS 0.0 05/14/2017 0917    No results found for: "POCLITH", "LITHIUM"   No results found for: "PHENYTOIN", "PHENOBARB", "VALPROATE", "CBMZ"   05/07/22 ONLY ON NORTRIPTYLINE A FEW DAYS: NORTRIP level 70  .res Assessment: Plan:    [facility]ice was seen  today for follow-up, depression and fatigue.  Diagnoses and all orders for this visit:  Recurrent major depression resistant to treatment (HCC) -     Pramipexole Dihydrochloride 4.5 MG TB24; Take 1 tablet (4.5 mg total) by mouth daily. -     nortriptyline (PAMELOR) 50 MG capsule; Take 2 capsules (100 mg total) by mouth at bedtime. -     methylphenidate 54 MG PO CR tablet; Take 1 tablet (54 mg total) by mouth daily. -     methylphenidate (CONCERTA) 54 MG PO CR tablet; Take 1 tablet (54 mg total) by mouth every morning.  Attention deficit hyperactivity disorder (ADHD), predominantly inattentive type  Restless legs syndrome -     Pramipexole Dihydrochloride 4.5 MG TB24; Take 1 tablet (4.5 mg total) by mouth daily. -     ALPRAZolam (XANAX) 0.25 MG tablet; TAKE 1 TABLETS EVERYDAY AS NEEDED ANXIETY and 1 at night if needed for sleep   30 min face to face time with patient was spent on counseling and coordination of care.  Recent severe depression in Jan 2023 getting better then worse.  But has had a number of bouts of depression in 2023.   Overall seems to have high tolerance of meds.   Was better with pramipexole 1 mg QID but now worse again with less DT poor compliance.    Disc SE including rare risks of compulsive behaviors with pramipexole.  Disc dosing range from off label TRD studies 1-5 mg daily.    Discussed potential benefits, risks, and side effects of stimulants with patient to include increased heart rate, palpitations, insomnia, increased anxiety, increased irritability, or decreased appetite.  Instructed patient to contact office if experiencing any significant tolerability issues.  Extensive discussion of tx options including repeating TMS, Spravato, TCA.  , MAOI If plan fails then selegiline.     Disc SE each  type of med including manic type sx.  Plan: continue Concerta 36 AM,  nortriptyline 100 mg HS  Off label for TRD increase pramipexole tswitch to ER to improve  compliance.  Extensive disc of alternative Spravato.  Checked nortriptyline level on 75 mg HS.  Then increased to 100 mg HS. 07/28/22  Amitriptyline Not Estab. ng/mL <20    Nortriptyline Not Estab. ng/mL 89  Amitriptyline & Nortriptyline, Total 80 - 200 ng/mL   Total <109  med changes today; switch to ER to improve compliance pramipexole 4.5 mg daily, Concerta 36 AM, nortrip 100 HS,    Xanax 0.25 mg HS.  Refer to therapy: Rachael Callahan or Rachael Callahan.  FU 8 weeks  Rachael Staggers, MD, [facility]AP[facility]   No future appointments.     No orders of the defined types were placed in this encounter.     -------------------------------

## 2022-12-05 ENCOUNTER — Ambulatory Visit: Payer: Medicare HMO | Admitting: Professional Counselor

## 2022-12-05 ENCOUNTER — Encounter: Payer: Self-pay | Admitting: Professional Counselor

## 2022-12-05 DIAGNOSIS — F331 Major depressive disorder, recurrent, moderate: Secondary | ICD-10-CM | POA: Diagnosis not present

## 2022-12-05 DIAGNOSIS — F4322 Adjustment disorder with anxiety: Secondary | ICD-10-CM

## 2022-12-05 NOTE — Progress Notes (Signed)
[facility] Counselor Initial Adult Exam  Name: Rachael Callahan Date: 12/17/2022 MRN: [id number] DOB: 26-Dec-1945 PCP: [facility] (Inactive)  Time spent: 11:14a - 12:17p  Guardian/Payee:  pt    Paperwork requested:  No   Reason for Visit /Presenting Problem: depression  Mental Status Exam:    Appearance:   Neat     Behavior:  Appropriate, Sharing, and Motivated  Motor:  Normal  Speech/Language:   Clear and Coherent and Normal Rate  Affect:  Appropriate and Congruent  Mood:  normal  Thought process:  normal  Thought content:    WNL  Sensory/Perceptual disturbances:    WNL  Orientation:  Sound  Attention:  Good  Concentration:  Good  Memory:  WNL  Fund of knowledge:   Good  Insight:    Good  Judgment:   Good  Impulse Control:  Good   Reported Symptoms:  anhedonia, low mood, sleep concerns, fatigue, appetite concerns, trouble concentrating, automatic negative thoughts, anxiousness, worries, catastrophic thinking, somatic pain (health concerns), stress, overwhelm, frustration with self, grief/loss by hx, phase of life concerns, interpersonal concerns    Risk Assessment: Danger to Self:  No Self-injurious Behavior: No Danger to Others: No Duty to Warn:no Physical Aggression / Violence:No  Access to Firearms a concern: No  Gang Involvement:No  Patient / guardian was educated about steps to take if suicide or homicide risk level increases between visits: n/a While future psychiatric events cannot be accurately predicted, the patient does not currently require acute inpatient psychiatric care and does not currently meet [facility] involuntary commitment criteria.  Substance Abuse History: Current substance abuse: No     Past Psychiatric History:   Previous psychological history is significant for ADHD and depression Outpatient Providers: no History of Psych Hospitalization: No  Psychological Testing:  n/a    Abuse History: Victim of No.,   n/a    Report needed: No. Victim of Neglect:No. Perpetrator of  n/a   Witness / Exposure to Domestic Violence: No   Protective Services Involvement: No  Witness to [employer] Violence:  No   Family History:  Father: depression  Living situation: the patient lives with their spouse  Sexual Orientation:  Straight  Relationship Status: married               If a parent, number of children / ages: 11yo dtr, 66yo twin sons; three foster teenagers by hx  Support Systems; spouse, family, friends  Surveyor, quantity Stress:  No   Income/Employment/Disability: retirement, Tax adviser Service: No   Educational History: Education: some college, certificate as Media planner  Religion/Sprituality/World View:    Christian  Any cultural differences that may affect / interfere with treatment:  not applicable   Recreation/Hobbies: time with family, reading  Stressors:Other: family concerns, health/aging concerns, house selling at present     Strengths:  Supportive Relationships, Family, Friends, Warehouse manager, Spirituality, Hopefulness, Self Advocate, Able to Communicate Effectively, and kind, empathetic  Barriers:  n/a   Legal History: Pending legal issue / charges: The patient has no significant history of legal issues. History of legal issue / charges:  n/a  Medical History/Surgical History:reviewed Past Medical History:  Diagnosis Date   Arthritis    Depression    High cholesterol    Hypertension    Migraine    Stomach ulcer     Past Surgical History:  Procedure Laterality Date   ABDOMINAL HYSTERECTOMY     CARPAL TUNNEL RELEASE Bilateral    ROTATOR CUFF REPAIR  Right    TOTAL KNEE ARTHROPLASTY Right 08/26/2022   Procedure: TOTAL KNEE ARTHROPLASTY;  Surgeon: Durene Romans, MD;  Location: [facility];  Service: Orthopedics;  Laterality: Right;   TRIGGER FINGER RELEASE      Medications: Current Outpatient Medications  Medication Sig Dispense Refill   ALPRAZolam (XANAX)  0.25 MG tablet TAKE 1 TABLETS EVERYDAY AS NEEDED ANXIETY and 1 at night if needed for sleep 40 tablet 0   Cholecalciferol 125 MCG (5000 UT) TABS Take 5,000 Units by mouth daily.     methocarbamol (ROBAXIN) 500 MG tablet Take 1 tablet (500 mg total) by mouth every 6 (six) hours as needed for muscle spasms (muscle pain). 40 tablet 2   methylphenidate (CONCERTA) 54 MG PO CR tablet Take 1 tablet (54 mg total) by mouth every morning. 30 tablet 0   [START ON 12/25/2022] methylphenidate (CONCERTA) 54 MG PO CR tablet Take 1 tablet (54 mg total) by mouth every morning. 30 tablet 0   methylphenidate 54 MG PO CR tablet Take 1 tablet (54 mg total) by mouth daily. 30 tablet 0   metoprolol succinate (TOPROL-XL) 50 MG 24 hr tablet Take 50 mg by mouth daily.     Multiple Vitamins-Minerals (MULTIVITAMIN ADULT PO) Take 1 tablet by mouth daily.     nortriptyline (PAMELOR) 50 MG capsule Take 2 capsules (100 mg total) by mouth at bedtime. 180 capsule 0   olmesartan (BENICAR) 40 MG tablet Take 40 mg by mouth daily.     omeprazole (PRILOSEC) 40 MG capsule Take 40 mg by mouth daily.  1   oxyCODONE (OXY IR/ROXICODONE) 5 MG immediate release tablet Take 1 tablet (5 mg total) by mouth every 4 (four) hours as needed for severe pain. 42 tablet 0   polyethylene glycol (MIRALAX / GLYCOLAX) 17 g packet Take 17 g by mouth 2 (two) times daily. 14 each 0   Pramipexole Dihydrochloride 4.5 MG TB24 Take 1 tablet (4.5 mg total) by mouth daily. 30 tablet 1   pravastatin (PRAVACHOL) 20 MG tablet Take 20 mg by mouth at bedtime.  3   Probiotic Product (PROBIOTIC-10 PO) Take 1 tablet by mouth daily.     No current facility-administered medications for this visit.    No Known Allergies  Diagnoses:    ICD-10-CM   1. Major depressive disorder, recurrent episode, moderate (HCC)  F33.1     2. Adjustment disorder with anxiety  F43.22       Treatment Provided: Counselor provided person-centered counseling including active listening,  building of rapport; clinical assessment; facilitation of GAD7 (6) and PHQ9 (12). Pt presented to session to address concerns of anxiety and depression as detailed above. She identified having struggled with depressive symptomology across adulthood to varying degrees, with an episode of depression in particular several years ago, the severity of which led her to consider in-patient treatment, however she felt that with the support of her husband she was able to "get through it." Pt reported experience of anxiety as being circumstantial in nature. She voiced "everyday things [as] getting to her", such as selling her house at this time, still recovering from a recent knee surgery, and family relations that are strained. She also identified marked stress with one of her adult children with whom she has significant and ongoing worry about; counselor assisted with resources for help per pt request. Pt shared regarding family hx including grief hx with a loss of one of her foster children to a car wreck. Counselor and pt discussed pt  strengths and support system, and patient counseling goals.  Plan of Care: Pt to return for a follow-up; continue to build rapport, assess symptoms and hx; discuss treatment plan and obtain consent.   Gaspar Bidding, [facility]

## 2022-12-09 ENCOUNTER — Other Ambulatory Visit: Payer: Self-pay | Admitting: Psychiatry

## 2022-12-21 ENCOUNTER — Other Ambulatory Visit: Payer: Self-pay | Admitting: Psychiatry

## 2022-12-21 DIAGNOSIS — F339 Major depressive disorder, recurrent, unspecified: Secondary | ICD-10-CM

## 2022-12-21 DIAGNOSIS — G2581 Restless legs syndrome: Secondary | ICD-10-CM

## 2022-12-24 ENCOUNTER — Other Ambulatory Visit: Payer: Self-pay | Admitting: Psychiatry

## 2023-01-04 ENCOUNTER — Other Ambulatory Visit: Payer: Self-pay | Admitting: Psychiatry

## 2023-01-04 DIAGNOSIS — F339 Major depressive disorder, recurrent, unspecified: Secondary | ICD-10-CM

## 2023-01-04 DIAGNOSIS — G2581 Restless legs syndrome: Secondary | ICD-10-CM

## 2023-01-08 ENCOUNTER — Ambulatory Visit (INDEPENDENT_AMBULATORY_CARE_PROVIDER_SITE_OTHER): Payer: Medicare HMO | Admitting: Professional Counselor

## 2023-01-08 ENCOUNTER — Encounter: Payer: Self-pay | Admitting: Professional Counselor

## 2023-01-08 ENCOUNTER — Telehealth: Payer: Self-pay | Admitting: Psychiatry

## 2023-01-08 DIAGNOSIS — F4322 Adjustment disorder with anxiety: Secondary | ICD-10-CM

## 2023-01-08 DIAGNOSIS — F331 Major depressive disorder, recurrent, moderate: Secondary | ICD-10-CM

## 2023-01-08 NOTE — Telephone Encounter (Signed)
Rachael Callahan notified that she is moving to [facility], [place]. She would like [place] to update [facility] [place], [place]. (Only 1 ).

## 2023-01-08 NOTE — Telephone Encounter (Signed)
Original pharmacy deleted and [facility] added.

## 2023-01-08 NOTE — Progress Notes (Signed)
      [facility] Counselor/Therapist Progress Note  Patient ID: Rachael Callahan, MRN: [id number],    Date: 01/08/2023  Time Spent: 1:08 PM to 2:06 PM  Treatment Type: Individual Therapy  Reported Symptoms: stress, worries, fatigue, sense of overwhelm, interpersonal concerns, phase of life concerns, mind racing, difficulty concentrating  Mental Status Exam:  Appearance:   Neat     Behavior:  Appropriate and Sharing  Motor:  Normal  Speech/Language:   Clear and Coherent and Normal Rate  Affect:  Appropriate and Congruent  Mood:  normal  Thought process:  normal  Thought content:    WNL  Sensory/Perceptual disturbances:    WNL  Orientation:  Sound  Attention:  Good  Concentration:  Good  Memory:  WNL  Fund of knowledge:   Good  Insight:    Good  Judgment:   Good  Impulse Control:  Good   Risk Assessment: Danger to Self:  No Self-injurious Behavior: No Danger to Others: No Duty to Warn:no Physical Aggression / Violence:No  Access to Firearms a concern: No  Gang Involvement:No   Subjective: Patient presented to session to address concerns of depression and anxiety.  She reported progress at this time.  Counselor and patient discussed patient treatment plan and patient gave her consent.  Patient reported house move to be underway and for circumstance to be stressful however hopeful once complete.  Patient processed other events surrounding family time and the holidays, and plans for the future.  Patient voiced intention to follow-up with resources counselor provided last session.  Patient voiced possibility of not continuing treatment due to proximity of new home location to therapy office.  Counselor shared options for treatment with patient and voiced patient is welcome to return as needed.  Counselor affirmed patient progress and positivity of support system and coping skills.  Interventions: Solution-Oriented/Positive Psychology, Humanistic/Existential, and  Insight-Oriented  Diagnosis:   ICD-10-CM   1. Major depressive disorder, recurrent episode, moderate (HCC)  F33.1     2. Adjustment disorder with anxiety  F43.22       Plan: Patient follow-up to be determined; continue process work and developing coping skills as indicated.  Progress note was dictated with Dragon and reviewed for accuracy.  Gaspar Bidding, [facility]

## 2023-01-15 ENCOUNTER — Other Ambulatory Visit: Payer: Self-pay

## 2023-01-15 ENCOUNTER — Telehealth: Payer: Self-pay | Admitting: Psychiatry

## 2023-01-15 DIAGNOSIS — G2581 Restless legs syndrome: Secondary | ICD-10-CM

## 2023-01-15 MED ORDER — ALPRAZOLAM 0.25 MG PO TABS: ORAL_TABLET | ORAL | 0 refills | Status: DC

## 2023-01-15 NOTE — Telephone Encounter (Signed)
Pended RF for alprazolam to [facility]. Last filled 12/9 for 20-day supply.

## 2023-01-15 NOTE — Telephone Encounter (Signed)
Pt called at 4:55p stating they moved to [facility]. In the move,she cannot find her Alprazolam. She is asking for Dr Jennelle Human to [facility] [address], [facility].  Next appt 1/8

## 2023-02-04 ENCOUNTER — Ambulatory Visit: Payer: Medicare HMO | Admitting: Psychiatry

## 2023-02-04 ENCOUNTER — Encounter: Payer: Self-pay | Admitting: Psychiatry

## 2023-02-04 DIAGNOSIS — G2581 Restless legs syndrome: Secondary | ICD-10-CM | POA: Diagnosis not present

## 2023-02-04 DIAGNOSIS — F900 Attention-deficit hyperactivity disorder, predominantly inattentive type: Secondary | ICD-10-CM

## 2023-02-04 DIAGNOSIS — F331 Major depressive disorder, recurrent, moderate: Secondary | ICD-10-CM | POA: Diagnosis not present

## 2023-02-04 DIAGNOSIS — F5105 Insomnia due to other mental disorder: Secondary | ICD-10-CM | POA: Diagnosis not present

## 2023-02-04 DIAGNOSIS — F339 Major depressive disorder, recurrent, unspecified: Secondary | ICD-10-CM

## 2023-02-04 MED ORDER — METHYLPHENIDATE HCL ER (OSM) 54 MG PO TBCR: 54.0000 mg | EXTENDED_RELEASE_TABLET | Freq: Every day | ORAL | 0 refills | Status: DC

## 2023-02-04 MED ORDER — METHYLPHENIDATE HCL ER (OSM) 54 MG PO TBCR: 54.0000 mg | EXTENDED_RELEASE_TABLET | ORAL | 0 refills | Status: DC

## 2023-02-04 MED ORDER — ALPRAZOLAM 0.25 MG PO TABS: ORAL_TABLET | ORAL | 3 refills | Status: DC

## 2023-02-04 MED ORDER — NORTRIPTYLINE HCL 50 MG PO CAPS: 100.0000 mg | ORAL_CAPSULE | Freq: Every day | ORAL | 1 refills | Status: DC

## 2023-02-04 MED ORDER — PRAMIPEXOLE DIHYDROCHLORIDE ER 4.5 MG PO TB24: 1.0000 | ORAL_TABLET | Freq: Every day | ORAL | 1 refills | Status: DC

## 2023-02-04 NOTE — Progress Notes (Signed)
 Rachael Callahan [id number] [birth year] 78 y.o.  Subjective:   Patient ID:  Rachael Callahan is a 78 y.o. (DOB 01/18/1946) female.  Chief Complaint:  Chief Complaint  Patient presents with   Follow-up   Depression   Anxiety   Stress    Depression        Associated symptoms include no decreased concentration and no suicidal ideas.  [name] presents to the office today for follow-up of TRD.  Prior addition of Concerta  had helped with depression and productivity and interest and alertness.  visit August 2020.  She was doing generally well and no meds were changed.  03/2019 appt with the following noted: BP up here but reports it has been normal at home.   No new problems.  Still good On Concerta  to 54 mg daily and duloxetine  120 mg since Jan 2020.  Has been doing well.  Overall the combination of meds is effective.  More productive and more interested and no longer depressed.  No longer napping.   Busy summer.  D in law brought 2 gkids from [place] and that was great.  GS 8 years older than adopted GD and they do well together. Thinks TMS helped some also. RLS managed usually with daily pramipexole . Xanax  use a couple times per month. Often over concerns with grand-daughter's mother who causes problems. Minimal anxiety .  Sleep good and less napping.  Patient reports stable mood and denies depressed or irritable moods.  Patient denies any recent difficulty with anxiety.  Patient denies difficulty with sleep initiation or maintenance. Night owl and gets 7-8 hours.  No napping.   Denies appetite disturbance.  Patient reports that energy and motivation have been good.  Patient denies any difficulty with concentration.  Patient denies any suicidal ideation. Concerned about GD who's not doing well with psych treatment.  Had seen [name] before but transferred.  GD Bipolar disorder dx.  Seen at [facility].  Also had hosp.  Plan:  No meds changed  10/11/19 appt with the following  noted: Covid free.  Vaccinated. Pretty good mood. Satisfied with meds otherwise. Last week problems with RLS after being stable for awhile.  No more caffeine than usual. No SE. Patient reports stable mood and denies depressed or irritable moods.  Patient denies any recent difficulty with anxiety.  Patient denies difficulty with sleep initiation or maintenance. Denies appetite disturbance.  Patient reports that energy and motivation have been good.  Patient denies any difficulty with concentration.  Patient denies any suicidal ideation. Uses Xanax  occ. [name] well. GD 26 and needs for insurance to see doc in [facility]. Plan: no changes  04/11/2020 appointment with the following noted: Since here concerta  increased to 72 mg AM.  I think it's what I needed.  More motivated and energetic and productive but not normal. Still on duloxetine  120 without SE.   Mood good.  Patient reports stable mood and denies depressed or irritable moods.  Patient denies any recent difficulty with anxiety.  Patient denies difficulty with sleep initiation or maintenance, but night owl so sleep is messed up with retirement. Denies appetite disturbance.  Patient reports that energy and motivation have been good.  Patient denies any difficulty with concentration.  Patient denies any suicidal ideation. RLS managed.   Plan :  No med changes and she agrees.  10/11/2020 appointment with the following noted: Xanax  3-4 times per month. RLS managed with pramipexole  and muscle relaxing cream. No sig depression.  Satisfied with meds.  Occ situational mood problems. Patient reports stable mood and denies depressed or irritable moods.  Patient denies any recent difficulty with anxiety.  Patient denies difficulty with sleep initiation or maintenance. Denies appetite disturbance.  Patient reports that energy and motivation have been good.  Patient denies any difficulty with concentration.  Patient denies any suicidal  ideation. Stress GD married last year to cocaine addict. No SE Initial insomnia at night but can sleep better in the day. 1 cup AM coffee. Plan: No med changes  02/26/2021 phone call from husband and also discussed with patient.  MD note as follows: RTC  [phone] [name] She's been in bed for a couple of days and doesn't feel life is worth living.  Not directly voiced SI.  Only like this for a couple of days.  She doesn't desire to go anywhere.  He knows of no trigger except ongoing family issues.  Disc with pt Felt bad for a couple of mos after virus in December.  Not Covid.  Cough ongoing.  Don't feel like doing anything.  No energy.  No interest.  Everything seems like too much to deal with.  No med changes. No SI but feels useless.  Seemed OK prior to virus. Change in Concerta  recently about a week or 2 ago.  Plan: Would prefer something that could have a fairly quick effect and not have to change the Cymbalta  given her history of doing well on it until recently. She did not respond to Abilify or Rexulti but she might respond to Vraylar  1.5 mg every morning.  It is quick-acting and compatible with her duloxetine  and Concerta .  Disc discussed side effects.  She is not suicidal and does not need hospitalization at this time. Her husband will come pick up samples of Vraylar  1.5 mg 1 every morning starting tomorrow.  Lorene Macintosh, MD, [facility]  04/30/2021 appointment with the following noted: Several phone calls since she was here. Took Vryalar for a week and saw benefit witout SE and then stopped it. Felt fine for awhile.  Then had hard time last weekend and took another Vraylar  bc felt weepy. Today feels fine with mood..  Generally low anxiety. Rare alprazolam . Taking generic Concerta  without a problem. No SE. Not much dep in last 6 mos except as noted. GD pregnant with first [name].  Concerned about GD physical's and mental health. GD married.   GD sober. Plan: continue Concerta  72,  duloxetine  120  05/21/21 took Vraylar  again for depression and felt better and rX sent.  Cost was a problem so PT assistance forms mailed to her.  07/12/21 TC complaining of sleepiness and relating it to Vraylar .  Dose reduced to QOD  08/01/21 No show  10/03/21 TC complaining of depression. Sent Rx mirtazapine  30 mg HS.  Still taking Vraylar  1.5 QOD.  Was told to stop it since it was apparently no lonnger helping  12/10/21 appt noted:  seen with [name] A lot of phone calls since here with depressive sx. Current psych meds: duloxetine  90, lithium  300, Ritalin  72 AM, mirtazapine  30. Depression worse in the last couple of week. SE lithium  tremor and jittery.  Therefore needing alprazolam  more often. Last free of dep about 6 weeks ago. Sleeping too much.  Sleep to escape depression.   Asks about Zoloft.   Plan: Plan: Stop mirtazapine  and lithium  Reduce duloxetine  to 60 mg daily for 1 week then 30 mg daily for 1 week then stop duloxetine  Start Auvelity  1 in the AM  for 1 week then 1 twice daily as long as dizziness is not a problem.  02/24/22 appt noted: Several phone calls since she was here.  Jittery on Auvelity  and stopped it. Referral sent for TMS. Then restarted Auvelity  and wanted a prescription February 07, 2022 Taking Auvelity  twice daily since here.   Meds: Auvelity  BID (AM & HS), Concerta  72 AM, pramipexole  1 mg HS.  No SE Didn't notice anything profound coming off other meds but does feel better generally.  Doesn't feel worse.  Stressful family sensation. Feels a little odd sometimes like I'm not totally engaged and don't have energy she wisshes long term.  Depression is better but not gone. Sleep is good.  Appetite good. No sig RLS with pramipexole . Planning to move to where D lives  [place] [facility] [place] [facility] and build house. Enjoys theater and music. Plan: continue Auvelity  BID, Concerta  36 AM, pramipexole  1 mg HS Still being evaluated for TMS  04/29/22 appt noted: Several phone calls since  here with various problems. Weaning TMS  and maybe somewhat helpful. Still so low energy.  Days of feeling sad withotu reason.  No recent crying spells-TMS helped.  Better motivation later in the day.  Not much interest or enjoyment.   No change off duloxetine  and on Auvelity . No SE now.   Plan: Plan: DT NR reduce Concerta  36 AM,  Off label for TRD increase pramipexole  to 0.5 mg AM and 1 mg HS Tapering TMS DC  AUVELITY  Start TCA nortriptyline  25 up to 75 mg HS  05/08/22 TC no benefit pramipexole  Plan:  Increase pramipexole  to 1 mg BID if tolerated if she has SE then split the AM dose to 1/2 in the AM and noon and 1 at night.     06/12/22 appt noted: Meds; concerta  36 AM, nortriptyline  75 HS, pramipexole  1 mg BID. Finished TMS end of April Seemed to level out with meds.  Not severe downs or crying in the last couple of weeks.  Tire easily.  Motivation so so.  A little better interest and enjoyment.  Not required to do much. Dep 4/10.   No SE noted except dry mouth.  No N or dizziness.   Needs to lose wt.   No RLS.   Sleep ok.   Plan: continue Concerta  36 AM,  Off label for TRD increase pramipexole  to 1 mg TID  nortriptyline  75 mg HS  07/23/22 appt noted: Finished TMS end of April 2024 Meds as above. No SE except dry mouth.  No sleepiness or N or revved up. No benefit with increase pramipexole .  Wants to feel better.  Andhedonia.  Didn't enjoy beach trip and should have.  Occ crying spells.    Good relationship with D and son in law.  Moving to live with them in [facility] [place] [facility].  Looking forward to it. No RLS. Pending knee surgery 08/26/22 Sleep ok.   Plan: Off label for TRD increase pramipexole  to 1 mg QID DT NR or SE If NR in 2 weeks then call and will increase to max 5 mg daily. Check nortriptyline  level .    07/28/22  Amitriptyline  Not Estab. ng/mL <20    Nortriptyline  Not Estab. ng/mL 89  Amitriptyline  & Nortriptyline , Total 80 - 200 ng/mL   Total <109 Plan increase to 100  mg HS from 75 mg HS  09/23/22 appt noted; Psych med; Xanax  0.25 mg prn, Concerta  54 AM, nortrip 100 HS, pramipexole  1 mg QID,  SE dryness Had TKR on R  08/26/22.  No opiates. Feeling better but not sure why.  Pretty quickly after here.  Overall mood is better.  Dep 1-2/10 is clearly better.  Can enjoy some things esp great grand babies.   GD Ariel has baby Iris.  Doing well.   Asks about incr prn Xanax .  Needs some to help with sleep bc awakening for an hour.  Some related to knee discomfort. [name] just retired.  Plan to sell house and move to [facility] where D located.  Plan: Plan: continue Concerta  36 AM,  nortriptyline  100 mg HS Off label for TRD increase pramipexole  to 1 mg QID helpful  11/27/22 appt noted: Psych med; Xanax  0.25 mg prn, Concerta  54 AM, nortrip 100 HS, pramipexole  1 mg QID (missing some),  SE dryness Still dealing with issues with TKR.  Doesn't think she is doing well enough.  Will have to go back to PT.  It is limiting activity and movement.  Gained 12 # since surgery.   Mood so so , not great.   Working to put house on [employer].  Preparing for this has been difficult.   H has good attitude.   Asked about therapy Plan: med changes today; switch to ER to improve compliance pramipexole  4.5 mg daily, Concerta  36 AM, nortrip 100 HS,    Xanax  0.25 mg HS.  02/04/23 appt noted: Psych meds as above.  Consistent with meds.   SE dryness.   Mood pretty good overall.   Moved to [facility] and will purchase mobile home there near D.  Staying with D and son in law.  Moving is stressful.  Will be 6-8 mos until move complete DT construction time.   [name] is supportive and stable. She thinks pramipexole  is helping.  Sleep can be mixed up with napping affecting night time sleep.  Getting enough sleep.  Psychiatric medications tried include  fluoxetine,  venlafaxine,  duloxetine  120 + Concerta  54, bupropion,  Trintellix, lithium ,  Mirtazapine  30 without help for  depression Auvelity  NR Nortriptyline  100  pramipexole  1 QID  Latuda 40 mg,  Rexulti 2 mg with no benefit,  Vraylar  1.5 had benefit and then lost it.  TMS helped, 2019  ; repeat 04/2022  Review of Systems:  Review of Systems  Cardiovascular:  Negative for chest pain and palpitations.  Musculoskeletal:  Positive for arthralgias.  Neurological:  Negative for dizziness and tremors.       RLS off and on  Psychiatric/Behavioral:  Positive for dysphoric mood and sleep disturbance. Negative for agitation, behavioral problems, confusion, decreased concentration, hallucinations, self-injury and suicidal ideas. The patient is not nervous/anxious and is not hyperactive.     Medications: I have reviewed the patient's current medications.  Current Outpatient Medications  Medication Sig Dispense Refill   Cholecalciferol 125 MCG (5000 UT) TABS Take 5,000 Units by mouth daily.     methocarbamol  (ROBAXIN ) 500 MG tablet Take 1 tablet (500 mg total) by mouth every 6 (six) hours as needed for muscle spasms (muscle pain). 40 tablet 2   metoprolol  succinate (TOPROL -XL) 50 MG 24 hr tablet Take 50 mg by mouth daily.     Multiple Vitamins-Minerals (MULTIVITAMIN ADULT PO) Take 1 tablet by mouth daily.     olmesartan (BENICAR) 40 MG tablet Take 40 mg by mouth daily.     omeprazole (PRILOSEC) 40 MG capsule Take 40 mg by mouth daily.  1   oxyCODONE  (OXY IR/ROXICODONE ) 5 MG immediate release tablet Take 1 tablet (5 mg total)  by mouth every 4 (four) hours as needed for severe pain. 42 tablet 0   polyethylene glycol (MIRALAX  / GLYCOLAX ) 17 g packet Take 17 g by mouth 2 (two) times daily. 14 each 0   pravastatin  (PRAVACHOL ) 20 MG tablet Take 20 mg by mouth at bedtime.  3   Probiotic Product (PROBIOTIC-10 PO) Take 1 tablet by mouth daily.     ALPRAZolam  (XANAX ) 0.25 MG tablet TAKE 1 TABLETS EVERYDAY AS NEEDED ANXIETY and 1 at night if needed for sleep 40 tablet 3   [START ON 03/04/2023] methylphenidate  (CONCERTA ) 54 MG  PO CR tablet Take 1 tablet (54 mg total) by mouth every morning. 30 tablet 0   methylphenidate  (CONCERTA ) 54 MG PO CR tablet Take 1 tablet (54 mg total) by mouth every morning. 30 tablet 0   [START ON 04/01/2023] methylphenidate  54 MG PO CR tablet Take 1 tablet (54 mg total) by mouth daily. 30 tablet 0   nortriptyline  (PAMELOR ) 50 MG capsule Take 2 capsules (100 mg total) by mouth at bedtime. 180 capsule 1   Pramipexole  Dihydrochloride 4.5 MG TB24 Take 1 tablet (4.5 mg total) by mouth daily. 90 tablet 1   No current facility-administered medications for this visit.    Medication Side Effects: None  Allergies: No Known Allergies  Past Medical History:  Diagnosis Date   Arthritis    Depression    High cholesterol    Hypertension    Migraine    Stomach ulcer     History reviewed. No pertinent family history.  Social History   Socioeconomic History   Marital status: Married    Spouse name: Not on file   Number of children: Not on file   Years of education: Not on file   Highest education level: Not on file  Occupational History   Not on file  Tobacco Use   Smoking status: Never   Smokeless tobacco: Never  Vaping Use   Vaping status: Never Used  Substance and Sexual Activity   Alcohol use: Yes    Comment: rarely   Drug use: Never   Sexual activity: Not on file  Other Topics Concern   Not on file  Social History Narrative   Not on file   Social Drivers of Health   Financial Resource Strain: Not on file  Food Insecurity: No Food Insecurity (08/26/2022)   Hunger Vital Sign    Worried About Running Out of Food in the Last Year: Never true    Ran Out of Food in the Last Year: Never true  Transportation Needs: No Transportation Needs (08/26/2022)   PRAPARE - Administrator, Civil Service (Medical): No    Lack of Transportation (Non-Medical): No  Physical Activity: Not on file  Stress: Not on file  Social Connections: Not on file  Intimate Partner Violence:  Not At Risk (08/26/2022)   Humiliation, Afraid, Rape, and Kick questionnaire    Fear of Current or Ex-Partner: No    Emotionally Abused: No    Physically Abused: No    Sexually Abused: No    Past Medical History, Surgical history, Social history, and Family history were reviewed and updated as appropriate.   Please see review of systems for further details on the patient's review from today.   Objective:   Physical Exam:  There were no vitals taken for this visit.  Physical Exam Constitutional:      General: She is not in acute distress.    Appearance: She is well-developed.  Musculoskeletal:        General: No deformity.  Neurological:     Mental Status: She is alert and oriented to person, place, and time.     Motor: No tremor.     Coordination: Coordination normal.     Gait: Gait normal.  Psychiatric:        Attention and Perception: Perception normal. She is attentive.        Mood and Affect: Mood is depressed. Mood is not anxious. Affect is blunt. Affect is not labile, angry or inappropriate.        Speech: Speech normal.        Behavior: Behavior normal. Behavior is not slowed.        Thought Content: Thought content normal. Thought content is not delusional. Thought content does not include homicidal or suicidal ideation. Thought content does not include suicidal plan.        Cognition and Memory: Cognition normal.        Judgment: Judgment normal.     Comments: Insight intact. No auditory or visual hallucinations. No delusions.  Talkative without pressure Much better with depression almost gone      Lab Review:     Component Value Date/Time   NA 136 08/27/2022 0410   K 4.5 08/27/2022 0410   CL 103 08/27/2022 0410   CO2 25 08/27/2022 0410   GLUCOSE 182 (H) 08/27/2022 0410   BUN 13 08/27/2022 0410   CREATININE 0.74 08/27/2022 0410   CALCIUM 8.7 (L) 08/27/2022 0410   GFRNONAA >60 08/27/2022 0410   GFRAA >60 05/14/2017 0917       Component Value  Date/Time   WBC 9.9 08/27/2022 0410   RBC 3.78 (L) 08/27/2022 0410   HGB 11.7 (L) 08/27/2022 0410   HCT 35.9 (L) 08/27/2022 0410   PLT 205 08/27/2022 0410   MCV 95.0 08/27/2022 0410   MCH 31.0 08/27/2022 0410   MCHC 32.6 08/27/2022 0410   RDW 13.0 08/27/2022 0410   LYMPHSABS 1.4 05/14/2017 0917   MONOABS 0.5 05/14/2017 0917   EOSABS 0.2 05/14/2017 0917   BASOSABS 0.0 05/14/2017 0917    No results found for: POCLITH, LITHIUM    No results found for: PHENYTOIN, PHENOBARB, VALPROATE, CBMZ   05/07/22 ONLY ON NORTRIPTYLINE  A FEW DAYS: NORTRIP level 70  .res Assessment: Plan:    Anarely was seen today for follow-up, depression, anxiety and stress.  Diagnoses and all orders for this visit:  Major depressive disorder, recurrent episode, moderate (HCC)  Attention deficit hyperactivity disorder (ADHD), predominantly inattentive type  Restless legs syndrome -     ALPRAZolam  (XANAX ) 0.25 MG tablet; TAKE 1 TABLETS EVERYDAY AS NEEDED ANXIETY and 1 at night if needed for sleep -     Pramipexole  Dihydrochloride 4.5 MG TB24; Take 1 tablet (4.5 mg total) by mouth daily.  Insomnia due to mental condition  Recurrent major depression resistant to treatment (HCC) -     methylphenidate  54 MG PO CR tablet; Take 1 tablet (54 mg total) by mouth daily. -     methylphenidate  (CONCERTA ) 54 MG PO CR tablet; Take 1 tablet (54 mg total) by mouth every morning. -     methylphenidate  (CONCERTA ) 54 MG PO CR tablet; Take 1 tablet (54 mg total) by mouth every morning. -     nortriptyline  (PAMELOR ) 50 MG capsule; Take 2 capsules (100 mg total) by mouth at bedtime. -     Pramipexole  Dihydrochloride 4.5 MG TB24; Take 1 tablet (4.5 mg total) by  mouth daily.    30 min face to face time with patient was spent on counseling and coordination of care.  Recent severe depression in Jan 2023 getting better then worse.  But has had a number of bouts of depression in 2023.   Overall seems to have high  tolerance of meds.   Was better with pramipexole  1 mg QID but now worse again with less DT poor compliance.  Seems ok to switch to ER pramipexole .  Disc SE including rare risks of compulsive behaviors with pramipexole .  Disc dosing range from off label TRD studies 1-5 mg daily.    Discussed potential benefits, risks, and side effects of stimulants with patient to include increased heart rate, palpitations, insomnia, increased anxiety, increased irritability, or decreased appetite.  Instructed patient to contact office if experiencing any significant tolerability issues.  Extensive discussion of tx options including repeating TMS, Spravato, TCA.  , MAOI If plan fails then selegiline.     Disc SE each type of med including manic type sx.  Plan: continue Concerta  36 AM,  nortriptyline  100 mg HS  Extensive disc of alternative Spravato.  Checked nortriptyline  level on 75 mg HS.  Then increased to 100 mg HS. 07/28/22  Amitriptyline  Not Estab. ng/mL <20    Nortriptyline  Not Estab. ng/mL 89  Amitriptyline  & Nortriptyline , Total 80 - 200 ng/mL   Total <109  No med changes today; switched prampexole to ER with better compliance pramipexole  4.5 mg daily, Concerta  36 AM, nortrip 100 HS,    Xanax  0.25 mg HS.  Refer to therapy: Marval Bunde or Deane Sprang.  FU she wants to spread out appt  Lorene Macintosh, MD, [facility]   No future appointments.     No orders of the defined types were placed in this encounter.     -------------------------------

## 2023-03-07 ENCOUNTER — Other Ambulatory Visit: Payer: Self-pay | Admitting: Psychiatry

## 2023-03-07 DIAGNOSIS — F339 Major depressive disorder, recurrent, unspecified: Secondary | ICD-10-CM

## 2023-05-10 ENCOUNTER — Other Ambulatory Visit: Payer: Self-pay | Admitting: Psychiatry

## 2023-05-10 DIAGNOSIS — F339 Major depressive disorder, recurrent, unspecified: Secondary | ICD-10-CM

## 2023-05-11 ENCOUNTER — Telehealth: Payer: Self-pay | Admitting: Psychiatry

## 2023-05-11 ENCOUNTER — Other Ambulatory Visit: Payer: Self-pay

## 2023-05-11 DIAGNOSIS — F339 Major depressive disorder, recurrent, unspecified: Secondary | ICD-10-CM

## 2023-05-11 MED ORDER — METHYLPHENIDATE HCL ER (OSM) 54 MG PO TBCR: 54.0000 mg | EXTENDED_RELEASE_TABLET | ORAL | 0 refills | Status: DC

## 2023-05-11 NOTE — Telephone Encounter (Signed)
 Pt called 4/12 5:32 pm requesting methylphidate  ER 54 mg to [facility] will be out Wednesday.

## 2023-05-11 NOTE — Telephone Encounter (Signed)
 Pended Concerta 54 mg.

## 2023-06-04 ENCOUNTER — Ambulatory Visit: Payer: Medicare HMO | Admitting: Psychiatry

## 2023-06-04 ENCOUNTER — Encounter: Payer: Self-pay | Admitting: Psychiatry

## 2023-06-04 DIAGNOSIS — F5105 Insomnia due to other mental disorder: Secondary | ICD-10-CM

## 2023-06-04 DIAGNOSIS — F900 Attention-deficit hyperactivity disorder, predominantly inattentive type: Secondary | ICD-10-CM

## 2023-06-04 DIAGNOSIS — F331 Major depressive disorder, recurrent, moderate: Secondary | ICD-10-CM

## 2023-06-04 DIAGNOSIS — G2581 Restless legs syndrome: Secondary | ICD-10-CM | POA: Diagnosis not present

## 2023-06-04 DIAGNOSIS — F339 Major depressive disorder, recurrent, unspecified: Secondary | ICD-10-CM

## 2023-06-04 MED ORDER — METHYLPHENIDATE HCL ER (OSM) 54 MG PO TBCR: 54.0000 mg | EXTENDED_RELEASE_TABLET | ORAL | 0 refills | Status: DC

## 2023-06-04 MED ORDER — PRAMIPEXOLE DIHYDROCHLORIDE ER 4.5 MG PO TB24: 1.0000 | ORAL_TABLET | Freq: Every day | ORAL | 1 refills | Status: DC

## 2023-06-04 MED ORDER — ALPRAZOLAM 0.25 MG PO TABS: ORAL_TABLET | ORAL | 3 refills | Status: DC

## 2023-06-04 MED ORDER — NORTRIPTYLINE HCL 50 MG PO CAPS
100.0000 mg | ORAL_CAPSULE | Freq: Every day | ORAL | 1 refills | Status: DC
Start: 2023-06-04 — End: 2023-09-29

## 2023-06-04 MED ORDER — METHYLPHENIDATE HCL ER (OSM) 54 MG PO TBCR: 54.0000 mg | EXTENDED_RELEASE_TABLET | Freq: Every day | ORAL | 0 refills | Status: DC

## 2023-06-04 NOTE — Progress Notes (Signed)
 Rachael Callahan [id number] 1945-10-02 78 y.o.  Subjective:   Patient ID:  Rachael Callahan is a 78 y.o. (DOB 02/06/1945) female.  Chief Complaint:  Chief Complaint  Patient presents with   Follow-up   Depression   Anxiety   Stress    Depression        Associated symptoms include no decreased concentration and no suicidal ideas.  Rachael Callahan presents to the office today for follow-up of TRD.  Prior addition of Concerta  had helped with depression and productivity and interest and alertness.  visit August 2020.  She was doing generally well and no meds were changed.  03/2019 appt with the following noted: BP up here but reports it has been normal at home.   No new problems.  Still good On Concerta  to 54 mg daily and duloxetine  120 mg since Jan 2020.  Has been doing well.  Overall the combination of meds is effective.  More productive and more interested and no longer depressed.  No longer napping.   Busy summer.  D in law brought 2 gkids from [place] and that was great.  GS 8 years older than adopted GD and they do well together. Thinks TMS helped some also. RLS managed usually with daily pramipexole . Xanax  use a couple times per month. Often over concerns with grand-daughter's mother who causes problems. Minimal anxiety .  Sleep good and less napping.  Patient reports stable mood and denies depressed or irritable moods.  Patient denies any recent difficulty with anxiety.  Patient denies difficulty with sleep initiation or maintenance. Night owl and gets 7-8 hours.  No napping.   Denies appetite disturbance.  Patient reports that energy and motivation have been good.  Patient denies any difficulty with concentration.  Patient denies any suicidal ideation. Concerned about GD who's not doing well with psych treatment.  Had seen Ammon Bales before but transferred.  GD Bipolar disorder dx.  Se[facility] at [facility].  Also had hosp.  Plan:  No meds changed  10/11/19 appt with the following  noted: Covid free.  Vaccinated. Pretty good mood. Satisfied with meds otherwise. Last week problems with RLS after being stable for awhile.  No more caffeine than usual. No SE. Patient reports stable mood and denies depressed or irritable moods.  Patient denies any recent difficulty with anxiety.  Patient denies difficulty with sleep initiation or maintenance. Denies appetite disturbance.  Patient reports that energy and motivation have been good.  Patient denies any difficulty with concentration.  Patient denies any suicidal ideation. Uses Xanax  occ. [name] well. GD 26 and needs for insurance to see doc in [facility] [place]. Plan: no changes  04/11/2020 appointment with the following noted: Since here concerta  increased to 72 mg AM.  I think it's what I needed.  More motivated and energetic and productive but not normal. Still on duloxetine  120 without SE.   Mood good.  Patient reports stable mood and denies depressed or irritable moods.  Patient denies any recent difficulty with anxiety.  Patient denies difficulty with sleep initiation or maintenance, but night owl so sleep is messed up with retirement. Denies appetite disturbance.  Patient reports that energy and motivation have been good.  Patient denies any difficulty with concentration.  Patient denies any suicidal ideation. RLS managed.   Plan :  No med changes and she agrees.  10/11/2020 appointment with the following noted: Xanax  3-4 times per month. RLS managed with pramipexole  and muscle relaxing cream. No sig depression.  Satisfied with meds.  Occ situational mood problems. Patient reports stable mood and denies depressed or irritable moods.  Patient denies any recent difficulty with anxiety.  Patient denies difficulty with sleep initiation or maintenance. Denies appetite disturbance.  Patient reports that energy and motivation have been good.  Patient denies any difficulty with concentration.  Patient denies any suicidal  ideation. Stress GD married last year to cocaine addict. No SE Initial insomnia at night but can sleep better in the day. 1 cup AM coffee. Plan: No med changes  02/26/2021 phone call from husband and also discussed with patient.  MD note as follows: RTC  [phone] Huntley Mai She's been in bed for a couple of days and doesn't feel life is worth living.  Not directly voiced SI.  Only like this for a couple of days.  She doesn't desire to go anywhere.  He knows of no trigger except ongoing family issues.  Disc with pt Felt bad for a couple of mos after virus in December.  Not Covid.  Cough ongoing.  Don't feel like doing anything.  No energy.  No interest.  Everything seems like too much to deal with.  No med changes. No SI but feels useless.  Seemed OK prior to virus. Change in Concerta  recently about a week or 2 ago.  Plan: Would prefer something that could have a fairly quick effect and not have to change the Cymbalta  given her history of doing well on it until recently. She did not respond to Abilify or Rexulti but she might respond to Vraylar  1.5 mg every morning.  It is quick-acting and compatible with her duloxetine  and Concerta .  Disc discussed side effects.  She is not suicidal and does not need hospitalization at this time. Her husband will come pick up samples of Vraylar  1.5 mg 1 every morning starting tomorrow.  Rachael Beat, MD, [facility]  04/30/2021 appointment with the following noted: Several phone calls since she was here. Took Vryalar for a week and saw benefit witout SE and then stopped it. Felt fine for awhile.  Then had hard time last weekend and took another Vraylar  bc felt weepy. Today feels fine with mood..  Generally low anxiety. Rare alprazolam . Taking generic Concerta  without a problem. No SE. Not much dep in last 6 mos except as noted. GD pregnant with first [name].  Concerned about GD physical's and mental health. GD married.   GD sober. Plan: continue Concerta  72,  duloxetine  120  05/21/21 took Vraylar  again for depression and felt better and rX sent.  Cost was a problem so PT assistance forms mailed to her.  07/12/21 TC complaining of sleepiness and relating it to Vraylar .  Dose reduced to QOD  08/01/21 No show  10/03/21 TC complaining of depression. Sent Rx mirtazapine  30 mg HS.  Still taking Vraylar  1.5 QOD.  Was told to stop it since it was apparently no lonnger helping  12/10/21 appt noted:  seen with Huntley Mai A lot of phone calls since here with depressive sx. Current psych meds: duloxetine  90, lithium  300, Ritalin  72 AM, mirtazapine  30. Depression worse in the last couple of week. SE lithium  tremor and jittery.  Therefore needing alprazolam  more often. Last free of dep about 6 weeks ago. Sleeping too much.  Sleep to escape depression.   Asks about Zoloft.   Plan: Plan: Stop mirtazapine  and lithium  Reduce duloxetine  to 60 mg daily for 1 week then 30 mg daily for 1 week then stop duloxetine  Start Auvelity  1 in the AM  for 1 week then 1 twice daily as long as dizziness is not a problem.  02/24/22 appt noted: Several phone calls since she was here.  Jittery on Auvelity  and stopped it. Referral sent for TMS. Then restarted Auvelity  and wanted a prescription February 07, 2022 Taking Auvelity  twice daily since here.   Meds: Auvelity  BID (AM & HS), Concerta  72 AM, pramipexole  1 mg HS.  No SE Didn't notice anything profound coming off other meds but does feel better generally.  Doesn't feel worse.  Stressful family sensation. Feels a little odd sometimes like I'm not totally engaged and don't have energy she wisshes long term.  Depression is better but not gone. Sleep is good.  Appetite good. No sig RLS with pramipexole . Planning to move to where D lives  in [facility] and build house. Enjoys theater and music. Plan: continue Auvelity  BID, Concerta  36 AM, pramipexole  1 mg HS Still being evaluated for TMS  04/29/22 appt noted: Several phone calls since  here with various problems. Weaning TMS  and maybe somewhat helpful. Still so low energy.  Days of feeling sad withotu reason.  No recent crying spells-TMS helped.  Better motivation later in the day.  Not much interest or enjoyment.   No change off duloxetine  and on Auvelity . No SE now.   Plan: Plan: DT NR reduce Concerta  36 AM,  Off label for TRD increase pramipexole  to 0.5 mg AM and 1 mg HS Tapering TMS DC  AUVELITY  Start TCA nortriptyline  25 up to 75 mg HS  05/08/22 TC no benefit pramipexole  Plan:  Increase pramipexole  to 1 mg BID if tolerated if she has SE then split the AM dose to 1/2 in the AM and noon and 1 at night.     06/12/22 appt noted: Meds; concerta  36 AM, nortriptyline  75 HS, pramipexole  1 mg BID. Finished TMS end of April Seemed to level out with meds.  Not severe downs or crying in the last couple of weeks.  Tire easily.  Motivation so so.  A little better interest and enjoyment.  Not required to do much. Dep 4/10.   No SE noted except dry mouth.  No N or dizziness.   Needs to lose wt.   No RLS.   Sleep ok.   Plan: continue Concerta  36 AM,  Off label for TRD increase pramipexole  to 1 mg TID  nortriptyline  75 mg HS  07/23/22 appt noted: Finished TMS end of April 2024 Meds as above. No SE except dry mouth.  No sleepiness or N or revved up. No benefit with increase pramipexole .  Wants to feel better.  Andhedonia.  Didn't enjoy beach trip and should have.  Occ crying spells.    Good relationship with D and son in law.  Moving to live with them in [facility].  Looking forward to it. No RLS. Pending knee surgery 08/26/22 Sleep ok.   Plan: Off label for TRD increase pramipexole  to 1 mg QID DT NR or SE If NR in 2 weeks then call and will increase to max 5 mg daily. Check nortriptyline  level .    07/28/22  Amitriptyline  Not Estab. ng/mL <20    Nortriptyline  Not Estab. ng/mL 89  Amitriptyline  & Nortriptyline , Total 80 - 200 ng/mL   Total <109 Plan increase to 100  mg HS from 75 mg HS  09/23/22 appt noted; Psych med; Xanax  0.25 mg prn, Concerta  54 AM, nortrip 100 HS, pramipexole  1 mg QID,  SE dryness Had TKR on R  08/26/22.  No opiates. Feeling better but not sure why.  Pretty quickly after here.  Overall mood is better.  Dep 1-2/10 is clearly better.  Can enjoy some things esp great grand babies.   GD Ariel has baby Iris.  Doing well.   Asks about incr prn Xanax .  Needs some to help with sleep bc awakening for an hour.  Some related to knee discomfort. Huntley Mai just retired.  Plan to sell house and move to [facility] where D located.  Plan: Plan: continue Concerta  36 AM,  nortriptyline  100 mg HS Off label for TRD increase pramipexole  to 1 mg QID helpful  11/27/22 appt noted: Psych med; Xanax  0.25 mg prn, Concerta  54 AM, nortrip 100 HS, pramipexole  1 mg QID (missing some),  SE dryness Still dealing with issues with TKR.  Doesn't think she is doing well enough.  Will have to go back to PT.  It is limiting activity and movement.  Gained 12 # since surgery.   Mood so so , not great.   Working to put house on [employer].  Preparing for this has been difficult.   H has good attitude.   Asked about therapy Plan: med changes today; switch to ER to improve compliance pramipexole  4.5 mg daily, Concerta  54 AM, nortrip 100 HS,    Xanax  0.25 mg HS.  02/04/23 appt noted: Psych meds as above.  Consistent with meds.   SE dryness.   Mood pretty good overall.   Moved to [facility] and will purchase mobile home there near D.  Staying with D and son in law.  Moving is stressful.  Will be 6-8 mos until move complete DT construction time.   Huntley Mai is supportive and stable. She thinks pramipexole  is helping.  Sleep can be mixed up with napping affecting night time sleep.  Getting enough sleep.  06/04/23 appt noted: Med: No med changes today; switched prampexole to ER with better compliance pramipexole  4.5 mg daily,  Concerta  54 AM, nortrip 100 HS,   Xanax  0.25 mg HS.    Really upset about being late and being told she couldn't be seen today.  Then pt after her cancelled and left.  And she could therefore be seen.  Drove from [facility].  Had trouble with GPS trying to get here.   Consistent. Fair .  A lot going on.  House being finished.  A lot to deal with. Can be productivity.   No SE Since knee surgery July gained 40#.  Seeing GP.  Started Ozempic.  No benefit.  Comfort food eating.  Mobility so so.  Doesn't feel like surgery helped.   No impulsivity , compulsivity. Can't think of any other concerns.   Living in D's house and it's OK.    Psychiatric medications tried include  fluoxetine,  venlafaxine,  duloxetine  120 + Concerta  54, bupropion,  Trintellix, lithium ,  Mirtazapine  30 without help for depression Auvelity  NR Nortriptyline  100  pramipexole  1 QID  Latuda 40 mg,  Rexulti 2 mg with no benefit,  Vraylar  1.5 had benefit and then lost it.  TMS helped, 2019  ; repeat 04/2022  Review of Systems:  Review of Systems  Cardiovascular:  Negative for chest pain and palpitations.  Musculoskeletal:  Positive for arthralgias.  Neurological:  Negative for dizziness, tremors and weakness.       RLS off and on  Psychiatric/Behavioral:  Positive for dysphoric mood and sleep disturbance. Negative for agitation, behavioral problems, confusion, decreased concentration, hallucinations, self-injury  and suicidal ideas. The patient is not nervous/anxious and is not hyperactive.     Medications: I have reviewed the patient's current medications.  Current Outpatient Medications  Medication Sig Dispense Refill   Cholecalciferol 125 MCG (5000 UT) TABS Take 5,000 Units by mouth daily.     methocarbamol  (ROBAXIN ) 500 MG tablet Take 1 tablet (500 mg total) by mouth every 6 (six) hours as needed for muscle spasms (muscle pain). 40 tablet 2   metoprolol  succinate (TOPROL -XL) 50 MG 24 hr tablet Take 50 mg by mouth daily.     Multiple Vitamins-Minerals  (MULTIVITAMIN ADULT PO) Take 1 tablet by mouth daily.     olmesartan (BENICAR) 40 MG tablet Take 40 mg by mouth daily.     omeprazole (PRILOSEC) 40 MG capsule Take 40 mg by mouth daily.  1   OZEMPIC, 0.25 OR 0.5 MG/DOSE, 2 MG/3ML SOPN Inject 0.5 mg into the skin once a week.     polyethylene glycol (MIRALAX  / GLYCOLAX ) 17 g packet Take 17 g by mouth 2 (two) times daily. 14 each 0   pravastatin  (PRAVACHOL ) 20 MG tablet Take 20 mg by mouth at bedtime.  3   Probiotic Product (PROBIOTIC-10 PO) Take 1 tablet by mouth daily.     ALPRAZolam  (XANAX ) 0.25 MG tablet TAKE 1 TABLETS EVERYDAY AS NEEDED ANXIETY and 1 at night if needed for sleep 40 tablet 3   [START ON 07/02/2023] methylphenidate  (CONCERTA ) 54 MG PO CR tablet Take 1 tablet (54 mg total) by mouth every morning. 30 tablet 0   [START ON 07/30/2023] methylphenidate  (CONCERTA ) 54 MG PO CR tablet Take 1 tablet (54 mg total) by mouth every morning. 30 tablet 0   methylphenidate  54 MG PO CR tablet Take 1 tablet (54 mg total) by mouth daily. 30 tablet 0   nortriptyline  (PAMELOR ) 50 MG capsule Take 2 capsules (100 mg total) by mouth at bedtime. 180 capsule 1   Pramipexole  Dihydrochloride 4.5 MG TB24 Take 1 tablet (4.5 mg total) by mouth daily. 90 tablet 1   No current facility-administered medications for this visit.    Medication Side Effects: None  Allergies: No Known Allergies  Past Medical History:  Diagnosis Date   Arthritis    Depression    High cholesterol    Hypertension    Migraine    Stomach ulcer     History reviewed. No pertinent family history.  Social History   Socioeconomic History   Marital status: Married    Spouse name: Not on file   Number of children: Not on file   Years of education: Not on file   Highest education level: Not on file  Occupational History   Not on file  Tobacco Use   Smoking status: Never   Smokeless tobacco: Never  Vaping Use   Vaping status: Never Used  Substance and Sexual Activity    Alcohol use: Yes    Comment: rarely   Drug use: Never   Sexual activity: Not on file  Other Topics Concern   Not on file  Social History Narrative   Not on file   Social Drivers of Health   Financial Resource Strain: Not on file  Food Insecurity: No Food Insecurity (08/26/2022)   Hunger Vital Sign    Worried About Running Out of Food in the Last Year: Never true    Ran Out of Food in the Last Year: Never true  Transportation Needs: No Transportation Needs (08/26/2022)   PRAPARE - Transportation  Lack of Transportation (Medical): No    Lack of Transportation (Non-Medical): No  Physical Activity: Not on file  Stress: Not on file  Social Connections: Not on file  Intimate Partner Violence: Not At Risk (08/26/2022)   Humiliation, Afraid, Rape, and Kick questionnaire    Fear of Current or Ex-Partner: No    Emotionally Abused: No    Physically Abused: No    Sexually Abused: No    Past Medical History, Surgical history, Social history, and Family history were reviewed and updated as appropriate.   Please see review of systems for further details on the patient's review from today.   Objective:   Physical Exam:  There were no vitals taken for this visit.  Physical Exam Constitutional:      General: She is not in acute distress.    Appearance: She is well-developed.  Musculoskeletal:        General: No deformity.  Neurological:     Mental Status: She is alert and oriented to person, place, and time.     Motor: No tremor.     Coordination: Coordination normal.     Gait: Gait normal.  Psychiatric:        Attention and Perception: Perception normal. She is attentive.        Mood and Affect: Mood is depressed. Mood is not anxious. Affect is tearful. Affect is not labile, blunt, angry or inappropriate.        Speech: Speech normal.        Behavior: Behavior normal. Behavior is not slowed.        Thought Content: Thought content normal. Thought content is not delusional.  Thought content does not include homicidal or suicidal ideation. Thought content does not include suicidal plan.        Cognition and Memory: Cognition normal.        Judgment: Judgment normal.     Comments: Insight intact. No auditory or visual hallucinations. No delusions.  Talkative without pressure Much better with depression almost gone      Lab Review:     Component Value Date/Time   NA 136 08/27/2022 0410   K 4.5 08/27/2022 0410   CL 103 08/27/2022 0410   CO2 25 08/27/2022 0410   GLUCOSE 182 (H) 08/27/2022 0410   BUN 13 08/27/2022 0410   CREATININE 0.74 08/27/2022 0410   CALCIUM 8.7 (L) 08/27/2022 0410   GFRNONAA >60 08/27/2022 0410   GFRAA >60 05/14/2017 0917       Component Value Date/Time   WBC 9.9 08/27/2022 0410   RBC 3.78 (L) 08/27/2022 0410   HGB 11.7 (L) 08/27/2022 0410   HCT 35.9 (L) 08/27/2022 0410   PLT 205 08/27/2022 0410   MCV 95.0 08/27/2022 0410   MCH 31.0 08/27/2022 0410   MCHC 32.6 08/27/2022 0410   RDW 13.0 08/27/2022 0410   LYMPHSABS 1.4 05/14/2017 0917   MONOABS 0.5 05/14/2017 0917   EOSABS 0.2 05/14/2017 0917   BASOSABS 0.0 05/14/2017 0917    No results found for: "POCLITH", "LITHIUM "   No results found for: "PHENYTOIN", "PHENOBARB", "VALPROATE", "CBMZ"   05/07/22 ONLY ON NORTRIPTYLINE  A FEW DAYS: NORTRIP level 70  .res Assessment: Plan:    Arrianne was seen today for follow-up, depression, anxiety and stress.  Diagnoses and all orders for this visit:  Major depressive disorder, recurrent episode, moderate (HCC)  Attention deficit hyperactivity disorder (ADHD), predominantly inattentive type  Restless legs syndrome -     ALPRAZolam  (XANAX ) 0.25 MG tablet;  TAKE 1 TABLETS EVERYDAY AS NEEDED ANXIETY and 1 at night if needed for sleep -     Pramipexole  Dihydrochloride 4.5 MG TB24; Take 1 tablet (4.5 mg total) by mouth daily.  Insomnia due to mental condition  Recurrent major depression resistant to treatment (HCC) -      methylphenidate  54 MG PO CR tablet; Take 1 tablet (54 mg total) by mouth daily. -     methylphenidate  (CONCERTA ) 54 MG PO CR tablet; Take 1 tablet (54 mg total) by mouth every morning. -     methylphenidate  (CONCERTA ) 54 MG PO CR tablet; Take 1 tablet (54 mg total) by mouth every morning. -     Pramipexole  Dihydrochloride 4.5 MG TB24; Take 1 tablet (4.5 mg total) by mouth daily. -     nortriptyline  (PAMELOR ) 50 MG capsule; Take 2 capsules (100 mg total) by mouth at bedtime.     30 min face to face time with patient was spent on counseling and coordination of care.  Recent severe depression in Jan 2023 getting better then worse.  But has had a number of bouts of depression in 2023.   Overall seems to have high tolerance of meds.   Was better with pramipexole  1 mg QID but now worse again with less DT poor compliance.  Seems ok to switch to ER pramipexole .  Disc SE including rare risks of compulsive behaviors with pramipexole .  Disc dosing range from off label TRD studies 1-5 mg daily.    Discussed potential benefits, risks, and side effects of stimulants with patient to include increased heart rate, palpitations, insomnia, increased anxiety, increased irritability, or decreased appetite.  Instructed patient to contact office if experiencing any significant tolerability issues.  Extensive discussion of tx options including repeating TMS, Spravato, TCA.  , MAOI If plan fails then selegiline.     Disc SE each type of med including manic type sx.  Extensive disc of alternative Spravato.  Checked nortriptyline  level on 75 mg HS.  Then increased to 100 mg HS. 07/28/22  Amitriptyline  Not Estab. ng/mL <20    Nortriptyline  Not Estab. ng/mL 89  Amitriptyline  & Nortriptyline , Total 80 - 200 ng/mL   Total <109  No med changes today;  switched prampexole to ER with better compliance pramipexole  4.5 mg daily, Concerta  54 AM,  nortrip 100 HS,   Xanax  0.25 mg HS.  Refer to therapy: Reid Capuchin  or Delmer Ferraris.  FU 4 mos  Rachael Beat, MD, DFAPA   No future appointments.     No orders of the defined types were placed in this encounter.     -------------------------------

## 2023-06-10 ENCOUNTER — Telehealth: Payer: Self-pay | Admitting: Psychiatry

## 2023-06-10 ENCOUNTER — Telehealth: Payer: Self-pay

## 2023-06-10 NOTE — Telephone Encounter (Signed)
 Called the pharmacy and they said there was a form they faxed 5 times on 5/8 and once today.  I called pharmacy back this afternoon as I still didn't have the form and it bounced back to them with communication error. They were asking if the Rx for Xanax  was for 20 days or 30 days. I told them 30 days, as medication was prn. They said they would fill it today. Patient notified.

## 2023-06-10 NOTE — Telephone Encounter (Signed)
 Rachael Callahan called and LM at 10:43 indicating that the pharmacy would not feel her Xanax .  They told her they needed added information for the dr.  Please call pharmacy to give approval to feel her prescription.  Next appt 10/05/23   [id number] - [place], [place] - [address]

## 2023-06-10 NOTE — Telephone Encounter (Signed)
 Xanax  Rx written for  ALPRAZolam  (XANAX ) 0.25 MG tablet; TAKE 1 TABLETS EVERYDAY AS NEEDED ANXIETY and 1 at night if needed for sleep   Pharmacy is questioning if this is supposed to last 30 days. It is in their system as 20 days and it looks like she is filling it that often. 04/27/2023 02/04/2023 1  Alprazolam  0.25 Mg Tablet 40.00 20 Ca Cot [id number] Nor (2366) 3/3 1.00 LME Medicare [place] 04/07/2023 02/04/2023 1  Methylphenidate  Er 54 Mg Tab 30.00 30 Ca Cot [id number] Nor (2366) 0/0  Medicare [place] 04/01/2023 02/04/2023 1  Alprazolam  0.25 Mg Tablet 40.00 20  Please advise.

## 2023-06-10 NOTE — Telephone Encounter (Signed)
 There is another message that details this.

## 2023-06-10 NOTE — Telephone Encounter (Signed)
 Pharmacy said they sent a form to complete on 5/8, reporting they sent 5 times. Have not seen anything. Asked them to resend to my attention.

## 2023-06-10 NOTE — Telephone Encounter (Signed)
 Pt  LVM @ 3:53p stating she was returning your call.  She said the pharmacy said they needed more info about the Alprazolam  and for her to reach out to her doc.   [id number] - [place], [place] - [address] [place] [address] Phone: [phone]  Fax: [phone]   Next appt 9/8

## 2023-09-11 ENCOUNTER — Telehealth: Payer: Self-pay | Admitting: Psychiatry

## 2023-09-11 NOTE — Telephone Encounter (Signed)
 Pt called at 4:55p, she would like a call back because she is not sleeping. She said she started seeing a new doctor and he going to have her do a sleep study but that's not helping her immediate situation.  She wonders if she needs to increase the Alprazolam  or try something for sleep.  Next appt 9/2

## 2023-09-11 NOTE — Telephone Encounter (Signed)
 LVM to [facility]

## 2023-09-14 ENCOUNTER — Other Ambulatory Visit: Payer: Self-pay | Admitting: Psychiatry

## 2023-09-14 NOTE — Telephone Encounter (Signed)
 Avoid caffeine after noon.  Ok to increase alprazolam  to 2-3 of the 0.25 mg tablets at bedtime.  If it doesn't work drop dose back down and call us .

## 2023-09-14 NOTE — Telephone Encounter (Addendum)
 Pt reporting having trouble sleeping. Can usually get to sleep, but doesn't stay asleep. Estimates getting 5 hours. Lots of situational things. Rates depression 5-6/10, anxiety 4-5/10, but it fluctuates. Reports having a consultation to see about another sleep study with Dr. [place]. H reports she snores. Abnormal TSH and lipids.   Reports can sleep with alprazolam , but takes more than prescribed in order to do that. Reports took 8 ZzzQuill one night last week because she needed to sleep and she slept, but when she woke up she was searching her house for family members who were no longer there. When asked about caffeine use she said she drank a lot of VIA.  I was not able to find this online to see what the caffeine content was. She was not home, but said it was in a plastic bottle, not the [employer] product.   Taking Alprazolam  0.25 mg BID  Concerta  54 mg - takes AM Pamelor  100 mg at bedtime Pramipexole  4.5 mg every day

## 2023-09-15 ENCOUNTER — Telehealth: Payer: Self-pay | Admitting: Psychiatry

## 2023-09-15 NOTE — Telephone Encounter (Signed)
 Called patient and told her she was supposed to take the Xanax  at bedtime, since she was having trouble sleeping. She has FU with Dr. Geoffry in 2 weeks.

## 2023-09-15 NOTE — Telephone Encounter (Signed)
 Rachael Callahan called about increasing her Xanax  and taking it at PM. She said she spoke to you. Wants to find out if she needs to take the Xanax  at PM? She needs to pick up 1 month of Xanax  by 11/4. Her pharmacy is [facility]. I am forwarding this to you as she wanted you to know this directly. Her phone number is [phone].

## 2023-09-15 NOTE — Telephone Encounter (Signed)
Addressed in a previous note

## 2023-09-15 NOTE — Telephone Encounter (Signed)
Reviewed recommendations with pt.

## 2023-09-15 NOTE — Telephone Encounter (Signed)
 Pt lvm stating confused on what to do about apt. Requesting return call from [place]. I talked to pt yesterday and explained apt 9/2 @ 1:30 w/CC telephone. Pt reporting not sleeping well and mentioned sleep study. Contact pt @ [phone]

## 2023-09-17 ENCOUNTER — Other Ambulatory Visit: Payer: Self-pay

## 2023-09-17 ENCOUNTER — Other Ambulatory Visit: Payer: Self-pay | Admitting: Psychiatry

## 2023-09-17 ENCOUNTER — Telehealth: Payer: Self-pay | Admitting: Psychiatry

## 2023-09-17 DIAGNOSIS — G2581 Restless legs syndrome: Secondary | ICD-10-CM

## 2023-09-17 MED ORDER — CLONAZEPAM 0.5 MG PO TABS
ORAL_TABLET | ORAL | 0 refills | Status: DC
Start: 2023-09-17 — End: 2023-09-29

## 2023-09-17 NOTE — Telephone Encounter (Signed)
 Pt reported still not sleeping thru the night with alprazolam  0.25 2-3 tablets. Reports she is up and down. Reports moved 6 months ago and said she doesn't feel like she has slept the entire night since the move, still unpacking. She said she felt like a new mother - sleep deprived.  Pended Rx earlier for alprazolam . Previously tried mirtazapine  but reported SE.   Psychiatric medications tried include  fluoxetine,  venlafaxine,  duloxetine  120 + Concerta  54, bupropion,  Trintellix, lithium ,  Mirtazapine  30 without help for depression [place]  NR Nortriptyline  100   pramipexole  1 QID   Latuda 40 mg,  Rexulti 2 mg with no benefit,  Vraylar  1.5 had benefit and then lost it.   TMS helped, 2019  ; repeat 04/2022

## 2023-09-17 NOTE — Telephone Encounter (Signed)
 Patient notified of Rx and recommendations.

## 2023-09-17 NOTE — Telephone Encounter (Signed)
 Stop alprazolam  and start clonazepam  1 or 1 and 1/2 tablets at night.  Will take 15 min longer to kick in than does alprazolam .  Should keep her asleep longer.

## 2023-09-17 NOTE — Telephone Encounter (Signed)
 Pt lvm @ 8:26 am. Stating need to clarify with Dawna instructions yesterday. Take  2-3 Alprazolam  .25 mg at night to help with sleep.  Only have couple left. Will need Rx to Pharmacy. Contact # [phone]

## 2023-09-18 ENCOUNTER — Telehealth: Payer: Self-pay | Admitting: Psychiatry

## 2023-09-18 DIAGNOSIS — F5105 Insomnia due to other mental disorder: Secondary | ICD-10-CM

## 2023-09-18 MED ORDER — QUETIAPINE FUMARATE 25 MG PO TABS: 25.0000 mg | ORAL_TABLET | Freq: Every day | ORAL | 0 refills | Status: DC

## 2023-09-18 NOTE — Telephone Encounter (Signed)
 Clonazepam  and alprazolam  cannot be stopped abruptly.  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  If this works we will discuss weaning clonazepam  when I see her.  Remind her of her upcoming appt and be sure to keep it.

## 2023-09-18 NOTE — Telephone Encounter (Signed)
 Pt took clonazepam  last night, took one tablet and took another 1/2 when she woke up at 2 AM. [place] she is still not sleeping, unable to function because she is so tired, is worsening her depression and she is angry and tearful. Reports that no one seems to understand that medications affect her differently. She reports still taking methylphenidate  54 mg in AM. She had reported earlier this week drinking a VIA drink, which does have some caffeine. Reported today she isn't drinking them that much. She is sleeping, but only about 5 hours today a day and she needs more. Denied daytime napping.

## 2023-09-18 NOTE — Telephone Encounter (Signed)
 Pt left vm saying she is still not sleeping. The additional Clonazepam  only let her sleep a few hours. She is becoming more depressed and angry because she is not sleeping & doesn't know what to do.

## 2023-09-18 NOTE — Telephone Encounter (Signed)
 Reviewed recommendations with patient. Rx for Seroquel  25 mg sent and she had been notified by pharmacy already. Reminded her of appt on 9/2, but it is a phone visit.

## 2023-09-21 ENCOUNTER — Telehealth: Payer: Self-pay

## 2023-09-21 NOTE — Telephone Encounter (Signed)
 If she tolerated it ok then increase to 2-3 of the 25 mg Seroquel  tablets.

## 2023-09-21 NOTE — Telephone Encounter (Signed)
 Called pt to see how she slept with the addition of Seroquel  25 mg. She reported she didn't get any more sleep. Reports getting 2 hours of sleep. She did report after having breakfast she took a 2 hour nap.  She has a consultation for a sleep study on 9/2. She reported last week being irritable before she wasn't sleeping. She verbalized the same today, but her affect was brighter today than it was last week.

## 2023-09-21 NOTE — Telephone Encounter (Signed)
 Pt notified of recommendation to increase Seroquel .

## 2023-09-27 ENCOUNTER — Other Ambulatory Visit: Payer: Self-pay | Admitting: Psychiatry

## 2023-09-27 DIAGNOSIS — F5105 Insomnia due to other mental disorder: Secondary | ICD-10-CM

## 2023-09-29 ENCOUNTER — Ambulatory Visit: Admitting: Psychiatry

## 2023-09-29 ENCOUNTER — Encounter: Payer: Self-pay | Admitting: Psychiatry

## 2023-09-29 DIAGNOSIS — G2581 Restless legs syndrome: Secondary | ICD-10-CM

## 2023-09-29 DIAGNOSIS — F339 Major depressive disorder, recurrent, unspecified: Secondary | ICD-10-CM | POA: Diagnosis not present

## 2023-09-29 DIAGNOSIS — F5105 Insomnia due to other mental disorder: Secondary | ICD-10-CM | POA: Diagnosis not present

## 2023-09-29 DIAGNOSIS — F900 Attention-deficit hyperactivity disorder, predominantly inattentive type: Secondary | ICD-10-CM | POA: Diagnosis not present

## 2023-09-29 MED ORDER — PRAMIPEXOLE DIHYDROCHLORIDE ER 2.25 MG PO TB24: 1.0000 | ORAL_TABLET | Freq: Every morning | ORAL | 0 refills | Status: DC

## 2023-09-29 MED ORDER — CLONAZEPAM 0.5 MG PO TABS
1.0000 mg | ORAL_TABLET | Freq: Every day | ORAL | 1 refills | Status: DC
Start: 2023-09-29 — End: 2023-10-23

## 2023-09-29 MED ORDER — NORTRIPTYLINE HCL 50 MG PO CAPS: 100.0000 mg | ORAL_CAPSULE | Freq: Every day | ORAL | 1 refills | Status: DC

## 2023-09-29 MED ORDER — PRAMIPEXOLE DIHYDROCHLORIDE ER 3.75 MG PO TB24: 1.0000 | ORAL_TABLET | Freq: Every morning | ORAL | 0 refills | Status: DC

## 2023-09-29 NOTE — Progress Notes (Signed)
 Rachael Callahan [id number] 1945-02-28 78 y.o.  Virtual Visit via Telephone Note  I connected with pt by telephone and verified that I am speaking with the correct person using two identifiers.   I discussed the limitations, risks, security and privacy concerns of performing an evaluation and management service by telephone and the availability of in person appointments. I also discussed with the patient that there may be a patient responsible charge related to this service. The patient expressed understanding and agreed to proceed.  I discussed the assessment and treatment plan with the patient. The patient was provided an opportunity to ask questions and all were answered. The patient agreed with the plan and demonstrated an understanding of the instructions.   The patient was advised to call back or seek an in-person evaluation if the symptoms worsen or if the condition fails to improve as anticipated.  I provided 30 minutes of non-face-to-face time during this encounter. The call started at 145 and ended at 93. The patient was located at home in [facility] and the provider was located office. Difficult for patient to get here.   Subjective:   Patient ID:  Rachael Callahan is a 78 y.o. (DOB [birth year]) female.  Chief Complaint:  Chief Complaint  Patient presents with   Follow-up   Depression   Sleeping Problem   Anxiety    Rachael Callahan presents to the office today for follow-up of TRD.  Prior addition of Concerta  had helped with depression and productivity and interest and alertness.  visit August 2020.  She was doing generally well and no meds were changed.  03/2019 appt with the following noted: BP up here but reports it has been normal at home.   No new problems.  Still good On Concerta  to 54 mg daily and duloxetine  120 mg since Jan 2020.  Has been doing well.  Overall the combination of meds is effective.  More productive and more interested and no longer depressed.  No longer  napping.   Busy summer.  D in law brought 2 gkids from [place] and that was great.  GS 8 years older than adopted GD and they do well together. Thinks TMS helped some also. RLS managed usually with daily pramipexole . Xanax  use a couple times per month. Often over concerns with grand-daughter's mother who causes problems. Minimal anxiety .  Sleep good and less napping.  Patient reports stable mood and denies depressed or irritable moods.  Patient denies any recent difficulty with anxiety.  Patient denies difficulty with sleep initiation or maintenance. Night owl and gets 7-8 hours.  No napping.   Denies appetite disturbance.  Patient reports that energy and motivation have been good.  Patient denies any difficulty with concentration.  Patient denies any suicidal ideation. Concerned about GD who's not doing well with psych treatment.  Had seen [name] before but transferred.  GD Bipolar disorder dx.  Seen at [facility].  Also had hosp.  Plan:  No meds changed  10/11/19 appt with the following noted: Covid free.  Vaccinated. Pretty good mood. Satisfied with meds otherwise. Last week problems with RLS after being stable for awhile.  No more caffeine than usual. No SE. Patient reports stable mood and denies depressed or irritable moods.  Patient denies any recent difficulty with anxiety.  Patient denies difficulty with sleep initiation or maintenance. Denies appetite disturbance.  Patient reports that energy and motivation have been good.  Patient denies any difficulty with concentration.  Patient denies any suicidal ideation. Uses  Xanax  occ. [name] well. GD 26 and needs for insurance to see doc in [facility]. Plan: no changes  04/11/2020 appointment with the following noted: Since here concerta  increased to 72 mg AM.  I think it's what I needed.  More motivated and energetic and productive but not normal. Still on duloxetine  120 without SE.   Mood good.  Patient reports stable mood and  denies depressed or irritable moods.  Patient denies any recent difficulty with anxiety.  Patient denies difficulty with sleep initiation or maintenance, but night owl so sleep is messed up with retirement. Denies appetite disturbance.  Patient reports that energy and motivation have been good.  Patient denies any difficulty with concentration.  Patient denies any suicidal ideation. RLS managed.   Plan :  No med changes and she agrees.  10/11/2020 appointment with the following noted: Xanax  3-4 times per month. RLS managed with pramipexole  and muscle relaxing cream. No sig depression.  Satisfied with meds.  Occ situational mood problems. Patient reports stable mood and denies depressed or irritable moods.  Patient denies any recent difficulty with anxiety.  Patient denies difficulty with sleep initiation or maintenance. Denies appetite disturbance.  Patient reports that energy and motivation have been good.  Patient denies any difficulty with concentration.  Patient denies any suicidal ideation. Stress GD married last year to cocaine addict. No SE Initial insomnia at night but can sleep better in the day. 1 cup AM coffee. Plan: No med changes  02/26/2021 phone call from husband and also discussed with patient.  Rachael Callahan note as follows: RTC  [phone] Rachael Callahan She's been in bed for a couple of days and doesn't feel life is worth living.  Not directly voiced SI.  Only like this for a couple of days.  She doesn't desire to go anywhere.  He knows of no trigger except ongoing family issues.  Disc with pt Felt bad for a couple of mos after virus in December.  Not Covid.  Cough ongoing.  Don't feel like doing anything.  No energy.  No interest.  Everything seems like too much to deal with.  No med changes. No SI but feels useless.  Seemed OK prior to virus. Change in Concerta  recently about a week or 2 ago.  Plan: Would prefer something that could have a fairly quick effect and not have to change the  Cymbalta  given her history of doing well on it until recently. She did not respond to Abilify or Rexulti but she might respond to Vraylar  1.5 mg every morning.  It is quick-acting and compatible with her duloxetine  and Concerta .  Disc discussed side effects.  She is not suicidal and does not need hospitalization at this time. Her husband will come pick up samples of Vraylar  1.5 mg 1 every morning starting tomorrow.  Rachael Macintosh, Rachael Callahan, [facility]  04/30/2021 appointment with the following noted: Several phone calls since she was here. Took Vryalar for a week and saw benefit witout SE and then stopped it. Felt fine for awhile.  Then had hard time last weekend and took another Vraylar  bc felt weepy. Today feels fine with mood..  Generally low anxiety. Rare alprazolam . Taking generic Concerta  without a problem. No SE. Not much dep in last 6 mos except as noted. GD pregnant with first [name].  Concerned about GD physical's and mental health. GD married.   GD sober. Plan: continue Concerta  72, duloxetine  120  05/21/21 took Vraylar  again for depression and felt better and rX  sent.  Cost was a problem so PT assistance forms mailed to her.  07/12/21 TC complaining of sleepiness and relating it to Vraylar .  Dose reduced to QOD  08/01/21 No show  10/03/21 TC complaining of depression. Sent Rx mirtazapine  30 mg HS.  Still taking Vraylar  1.5 QOD.  Was told to stop it since it was apparently no lonnger helping  12/10/21 appt noted:  seen with Rachael Callahan A lot of phone calls since here with depressive sx. Current psych meds: duloxetine  90, lithium  300, Ritalin  72 AM, mirtazapine  30. Depression worse in the last couple of week. SE lithium  tremor and jittery.  Therefore needing alprazolam  more often. Last free of dep about 6 weeks ago. Sleeping too much.  Sleep to escape depression.   Asks about Zoloft.   Plan: Plan: Stop mirtazapine  and lithium  Reduce duloxetine  to 60 mg daily for 1 week then 30 mg daily for 1  week then stop duloxetine  Start Auvelity  1 in the AM for 1 week then 1 twice daily as long as dizziness is not a problem.  02/24/22 appt noted: Several phone calls since she was here.  Jittery on Auvelity  and stopped it. Referral sent for TMS. Then restarted Auvelity  and wanted a prescription February 07, 2022 Taking Auvelity  twice daily since here.   Meds: Auvelity  BID (AM & HS), Concerta  72 AM, pramipexole  1 mg HS.  No SE Didn't notice anything profound coming off other meds but does feel better generally.  Doesn't feel worse.  Stressful family sensation. Feels a little odd sometimes like I'm not totally engaged and don't have energy she wisshes long term.  Depression is better but not gone. Sleep is good.  Appetite good. No sig RLS with pramipexole . Planning to move to where D lives  in [facility] [facility] and build house. Enjoys theater and music. Plan: continue Auvelity  BID, Concerta  36 AM, pramipexole  1 mg HS Still being evaluated for TMS  04/29/22 appt noted: Several phone calls since here with various problems. Weaning TMS  and maybe somewhat helpful. Still so low energy.  Days of feeling sad withotu reason.  No recent crying spells-TMS helped.  Better motivation later in the day.  Not much interest or enjoyment.   No change off duloxetine  and on Auvelity . No SE now.   Plan: Plan: DT NR reduce Concerta  36 AM,  Off label for TRD increase pramipexole  to 0.5 mg AM and 1 mg HS Tapering TMS DC  AUVELITY  Start TCA nortriptyline  25 up to 75 mg HS  05/08/22 TC no benefit pramipexole  Plan:  Increase pramipexole  to 1 mg BID if tolerated if she has SE then split the AM dose to 1/2 in the AM and noon and 1 at night.     06/12/22 appt noted: Meds; concerta  36 AM, nortriptyline  75 HS, pramipexole  1 mg BID. Finished TMS end of April Seemed to level out with meds.  Not severe downs or crying in the last couple of weeks.  Tire easily.  Motivation so so.  A little better interest and enjoyment.  Not  required to do much. Dep 4/10.   No SE noted except dry mouth.  No N or dizziness.   Needs to lose wt.   No RLS.   Sleep ok.   Plan: continue Concerta  36 AM,  Off label for TRD increase pramipexole  to 1 mg TID  nortriptyline  75 mg HS  07/23/22 appt noted: Finished TMS end of April 2024 Meds as above. No SE except dry mouth.  No sleepiness or N or revved up. No benefit with increase pramipexole .  Wants to feel better.  Andhedonia.  Didn't enjoy beach trip and should have.  Occ crying spells.    Good relationship with D and son in law.  Moving to live with them in [facility] [place].  Looking forward to it. No RLS. Pending knee surgery 08/26/22 Sleep ok.   Plan: Off label for TRD increase pramipexole  to 1 mg QID DT NR or SE If NR in 2 weeks then call and will increase to max 5 mg daily. Check nortriptyline  level .    07/28/22  Amitriptyline  Not Estab. ng/mL <20    Nortriptyline  Not Estab. ng/mL 89  Amitriptyline  & Nortriptyline , Total 80 - 200 ng/mL   Total <109 Plan increase to 100 mg HS from 75 mg HS  09/23/22 appt noted; Psych med; Xanax  0.25 mg prn, Concerta  54 AM, nortrip 100 HS, pramipexole  1 mg QID,  SE dryness Had TKR on R 08/26/22.  No opiates. Feeling better but not sure why.  Pretty quickly after here.  Overall mood is better.  Dep 1-2/10 is clearly better.  Can enjoy some things esp great grand babies.   GD Ariel has baby Iris.  Doing well.   Asks about incr prn Xanax .  Needs some to help with sleep bc awakening for an hour.  Some related to knee discomfort. Rachael Callahan just retired.  Plan to sell house and move to [place] where D located.  Plan: Plan: continue Concerta  36 AM,  nortriptyline  100 mg HS Off label for TRD increase pramipexole  to 1 mg QID helpful  11/27/22 appt noted: Psych med; Xanax  0.25 mg prn, Concerta  54 AM, nortrip 100 HS, pramipexole  1 mg QID (missing some),  SE dryness Still dealing with issues with TKR.  Doesn't think she is doing well enough.  Will  have to go back to PT.  It is limiting activity and movement.  Gained 12 # since surgery.   Mood so so , not great.   Working to put house on [employer].  Preparing for this has been difficult.   H has good attitude.   Asked about therapy Plan: med changes today; switch to ER to improve compliance pramipexole  4.5 mg daily, Concerta  54 AM, nortrip 100 HS,    Xanax  0.25 mg HS.  02/04/23 appt noted: Psych meds as above.  Consistent with meds.   SE dryness.   Mood pretty good overall.   Moved to [place] and will purchase mobile home there near D.  Staying with D and son in law.  Moving is stressful.  Will be 6-8 mos until move complete DT construction time.   Rachael Callahan is supportive and stable. She thinks pramipexole  is helping.  Sleep can be mixed up with napping affecting night time sleep.  Getting enough sleep.  06/04/23 appt noted: Med: No med changes today; switched prampexole to ER with better compliance pramipexole  4.5 mg daily,  Concerta  54 AM, nortrip 100 HS,   Xanax  0.25 mg HS.   Really upset about being late and being told she couldn't be seen today.  Then pt after her cancelled and left.  And she could therefore be seen.  Drove from [place].  Had trouble with GPS trying to get here.   Consistent. Fair .  A lot going on.  House being finished.  A lot to deal with. Can be productivity.   No SE Since knee surgery July gained 40#.  Seeing  GP.  Started Ozempic.  No benefit.  Comfort food eating.  Mobility so so.  Doesn't feel like surgery helped.   No impulsivity , compulsivity. Can't think of any other concerns.   Living in D's house and it's OK.   Plan: no med changes.  Refer to therapy.  09/11/23 TC:  Pt reporting having trouble sleeping. Can usually get to sleep, but doesn't stay asleep. Estimates getting 5 hours. Lots of situational things. Rates depression 5-6/10, anxiety 4-5/10, but it fluctuates. Reports having a consultation to see about another sleep study with Dr. Mod[place]. H  reports she snores. Abnormal TSH and lipids.   Reports can sleep with alprazolam , but takes more than prescribed in order to do that. Reports took 8 ZzzQuill one night last week because she needed to sleep and she slept, but when she woke up she was searching her house for family members who were no longer there. When asked about caffeine use she said she drank a lot of VIA.  I was not able to find this online to see what the caffeine content was. She was not home, but said it was in a plastic bottle, not the [employer] product.  Rachael Callahan resp:  Avoid caffeine after noon.  Ok to increase alprazolam  to 2-3 of the 0.25 mg tablets at bedtime.  If it doesn't work drop dose back down and call us .    Rachael Macintosh, Rachael Callahan, Rachael Callahan  09/17/23 TC:  Pt reported still not sleeping thru the night with alprazolam  0.25 2-3 tablets. Reports she is up and down. Reports moved 6 months ago and said she doesn't feel like she has slept the entire night since the move, still unpacking. She said she felt like a new mother - sleep deprived.  Pended Rx earlier for alprazolam . Previously tried mirtazapine  but reported SE.     Rachael Callahan resP ;  Stop alprazolam  and start clonazepam  1 or 1 and 1/2 tablets at night.  Will take 15 min longer to kick in than does alprazolam .  Should keep her asleep longer.     Rachael Macintosh, Rachael Callahan, Rachael Callahan       09/18/23 TC:  Pt took clonazepam  last night, took one tablet and took another 1/2 when she woke up at 2 AM. Rachael Callahan she is still not sleeping, unable to function because she is so tired, is worsening her depression and she is angry and tearful. Reports that no one seems to understand that medications affect her differently. She reports still taking methylphenidate  54 mg in AM. She had reported earlier this week drinking a VIA drink, which does have some caffeine. Reported today she isn't drinking them that much. She is sleeping, but only about 5 hours today a day and she needs more. Denied daytime napping.     Rachael Callahan  resp: Clonazepam  and alprazolam  cannot be stopped abruptly. 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 If this works we will discuss weaning clonazepam  when I see her. Remind her of her upcoming appt and be sure to keep it. Rachael Macintosh, Rachael Callahan, Rachael Callahan  09/29/23 TC:  Called pt to see how she slept with the addition of Seroquel  25 mg. She reported she didn't get any more sleep. Reports getting 2 hours of sleep. She did report after having breakfast she took a 2 hour nap.  She has a consultation for a sleep study on 9/2. She reported last week being irritable before she wasn't sleeping. She verbalized the same  today, but her affect was brighter today than it was last week.     Rachael Callahan resp:  If she tolerated it ok then increase to 2-3 of the 25 mg Seroquel  tablets.  Rachael Macintosh, Rachael Callahan, Rachael Callahan  09/29/23 appt noted:  Med: clonazepam  1 mg and quetiapine  50 mg HS, Concerta  54 AM, nortriptyline  100 mg , Pramipexole  ER 4.5 mg daily. No SE.  Consistent. Last night slept in 2 hour spurts with awakening in between.  Got 6 hours. After  More trouble with sleep for no reason but correlates with move to [facility]. Sleep study consultation tomorrow.  No consistent RLS .  Not weekly. Still dealing with depression.   A lot of changes in our lives.  I get aggivated I don't feel better than I do. If were not a [place], I might have taken my own life.  Last not dep for over 2 weeks a few months ago.  No sense of purpose.   Get tired of taking meds.   Dep 8/10.  Just finished house and moved in in May.    Psychiatric medications tried include  fluoxetine,  venlafaxine,  duloxetine  120 + Concerta  54, bupropion,  Trintellix, lithium ,  Mirtazapine  30 without help for depression Auvelity  NR Nortriptyline  100  pramipexole  1 QID  Latuda 40 mg,  Rexulti 2 mg with no benefit,  Vraylar  1.5 had benefit and then lost it.  TMS helped, 2019  ; repeat 04/2022  F hosp for dep.   Sister also  with dep.  Review of Systems:  Review of Systems  Cardiovascular:  Negative for chest pain and palpitations.  Musculoskeletal:  Positive for arthralgias.  Neurological:  Negative for dizziness, tremors and weakness.       RLS off and on  Psychiatric/Behavioral:  Positive for depression, dysphoric mood and sleep disturbance. Negative for agitation, behavioral problems, confusion, decreased concentration, hallucinations, self-injury and suicidal ideas. The patient is not nervous/anxious and is not hyperactive.     Medications: I have reviewed the patient's current medications.  Current Outpatient Medications  Medication Sig Dispense Refill   Cholecalciferol 125 MCG (5000 UT) TABS Take 5,000 Units by mouth daily.     clonazePAM  (KLONOPIN ) 0.5 MG tablet 1 to 1 and 1/2 tablets at night for sleep (Patient not taking: Reported on 09/29/2023) 45 tablet 0   methocarbamol  (ROBAXIN ) 500 MG tablet Take 1 tablet (500 mg total) by mouth every 6 (six) hours as needed for muscle spasms (muscle pain). 40 tablet 2   methylphenidate  (CONCERTA ) 54 MG PO CR tablet Take 1 tablet (54 mg total) by mouth every morning. 30 tablet 0   methylphenidate  (CONCERTA ) 54 MG PO CR tablet Take 1 tablet (54 mg total) by mouth every morning. 30 tablet 0   methylphenidate  54 MG PO CR tablet Take 1 tablet (54 mg total) by mouth daily. 30 tablet 0   metoprolol  succinate (TOPROL -XL) 50 MG 24 hr tablet Take 50 mg by mouth daily.     Multiple Vitamins-Minerals (MULTIVITAMIN ADULT PO) Take 1 tablet by mouth daily.     nortriptyline  (PAMELOR ) 50 MG capsule Take 2 capsules (100 mg total) by mouth at bedtime. 180 capsule 1   olmesartan (BENICAR) 40 MG tablet Take 40 mg by mouth daily.     omeprazole (PRILOSEC) 40 MG capsule Take 40 mg by mouth daily.  1   OZEMPIC, 0.25 OR 0.5 MG/DOSE, 2 MG/3ML SOPN Inject 0.5 mg into the skin once a week.     polyethylene glycol (  MIRALAX  / GLYCOLAX ) 17 g packet Take 17 g by mouth 2 (two) times daily. 14  each 0   Pramipexole  Dihydrochloride 4.5 MG TB24 Take 1 tablet (4.5 mg total) by mouth daily. 90 tablet 1   pravastatin  (PRAVACHOL ) 20 MG tablet Take 20 mg by mouth at bedtime.  3   Probiotic Product (PROBIOTIC-10 PO) Take 1 tablet by mouth daily.     QUEtiapine  (SEROQUEL ) 25 MG tablet Take 2-3 tablets (50-75 mg total) by mouth at bedtime. 90 tablet 0   No current facility-administered medications for this visit.    Medication Side Effects: None  Allergies: No Known Allergies  Past Medical History:  Diagnosis Date   Arthritis    Depression    High cholesterol    Hypertension    Migraine    Stomach ulcer     History reviewed. No pertinent family history.  Social History   Socioeconomic History   Marital status: Married    Spouse name: Not on file   Number of children: Not on file   Years of education: Not on file   Highest education level: Not on file  Occupational History   Not on file  Tobacco Use   Smoking status: Never   Smokeless tobacco: Never  Vaping Use   Vaping status: Never Used  Substance and Sexual Activity   Alcohol use: Yes    Comment: rarely   Drug use: Never   Sexual activity: Not on file  Other Topics Concern   Not on file  Social History Narrative   Not on file   Social Drivers of Health   Financial Resource Strain: Not on file  Food Insecurity: No Food Insecurity (08/26/2022)   Hunger Vital Sign    Worried About Running Out of Food in the Last Year: Never true    Ran Out of Food in the Last Year: Never true  Transportation Needs: No Transportation Needs (08/26/2022)   PRAPARE - Administrator, Civil Service (Medical): No    Lack of Transportation (Non-Medical): No  Physical Activity: Not on file  Stress: Not on file  Social Connections: Not on file  Intimate Partner Violence: Not At Risk (08/26/2022)   Humiliation, Afraid, Rape, and Kick questionnaire    Fear of Current or Ex-Partner: No    Emotionally Abused: No     Physically Abused: No    Sexually Abused: No    Past Medical History, Surgical history, Social history, and Family history were reviewed and updated as appropriate.   Please see review of systems for further details on the patient's review from today.   Objective:   Physical Exam:  There were no vitals taken for this visit.  Physical Exam Constitutional:      General: She is not in acute distress.    Appearance: She is well-developed.  Musculoskeletal:        General: No deformity.  Neurological:     Mental Status: She is alert and oriented to person, place, and time.     Motor: No tremor.     Coordination: Coordination normal.     Gait: Gait normal.  Psychiatric:        Attention and Perception: Perception normal. She is attentive.        Mood and Affect: Mood is depressed. Mood is not anxious. Affect is not labile, blunt, angry or inappropriate.        Speech: Speech normal.        Behavior: Behavior normal.  Behavior is not slowed.        Thought Content: Thought content normal. Thought content is not delusional. Thought content does not include homicidal or suicidal ideation. Thought content does not include suicidal plan.        Cognition and Memory: Cognition normal.        Judgment: Judgment normal.     Comments: Insight intact. No auditory or visual hallucinations. No delusions.  Talkative without pressure Dep 8/10.  Some death thoughts.  No SI      Lab Review:     Component Value Date/Time   NA 136 08/27/2022 0410   K 4.5 08/27/2022 0410   CL 103 08/27/2022 0410   CO2 25 08/27/2022 0410   GLUCOSE 182 (H) 08/27/2022 0410   BUN 13 08/27/2022 0410   CREATININE 0.74 08/27/2022 0410   CALCIUM 8.7 (L) 08/27/2022 0410   GFRNONAA >60 08/27/2022 0410   GFRAA >60 05/14/2017 0917       Component Value Date/Time   WBC 9.9 08/27/2022 0410   RBC 3.78 (L) 08/27/2022 0410   HGB 11.7 (L) 08/27/2022 0410   HCT 35.9 (L) 08/27/2022 0410   PLT 205 08/27/2022 0410    MCV 95.0 08/27/2022 0410   MCH 31.0 08/27/2022 0410   MCHC 32.6 08/27/2022 0410   RDW 13.0 08/27/2022 0410   LYMPHSABS 1.4 05/14/2017 0917   MONOABS 0.5 05/14/2017 0917   EOSABS 0.2 05/14/2017 0917   BASOSABS 0.0 05/14/2017 0917    No results found for: POCLITH, LITHIUM    No results found for: PHENYTOIN, PHENOBARB, VALPROATE, CBMZ   05/07/22 ONLY ON NORTRIPTYLINE  A FEW DAYS: NORTRIP level 70  07/28/22  on 75 mg daily. Amitriptyline  Not Estab. ng/mL <20    Nortriptyline  Not Estab. ng/mL 89  Amitriptyline  & Nortriptyline , Total 80 - 200 ng/mL   Total <109  .res Assessment: Plan:    Sabria was seen today for follow-up, depression, sleeping problem and anxiety.  Diagnoses and all orders for this visit:  Recurrent major depression resistant to treatment [facility])  Attention deficit hyperactivity disorder (ADHD), predominantly inattentive type  Insomnia due to mental condition  Restless legs syndrome      30 min face to face time with patient was spent on counseling and coordination of care.  severe depression in Jan 2023 getting better then worse.  But has had a number of bouts of depression in 2023 and since.  Overall seems to have high tolerance of meds.   Was better with pramipexole  1 mg QID but now worse again with less DT poor compliance.  Was  ok with switch to ER pramipexole  then relapse again. Will taper this.  Hx benefit nortriptyline  with low blood level but relapsed.  So will increase to 150 mg HS  Discussed potential benefits, risks, and side effects of stimulants with patient to include increased heart rate, palpitations, insomnia, increased anxiety, increased irritability, or decreased appetite.  Instructed patient to contact office if experiencing any significant tolerability issues.  Extensive discussion of tx options including repeating TMS, Spravato, TCA.  , MAOI consider selegiline, higher dose quetiapine ,   Disc SE each type of med  including manic type sx.  Extensive disc of alternative Spravato.  07/28/22  On  100 mg HS. Amitriptyline  Not Estab. ng/mL <20    Nortriptyline  Not Estab. ng/mL 89  Amitriptyline  & Nortriptyline , Total 80 - 200 ng/mL   Total <109  Increase nortriptyline  150 mg HS for tRD.    DT relapse of depression  start weaning pramipexole  to ER every 2 weeks.  Concerta  54 AM,   DT TR insomnia  continue quetiapine  51 with clonazepam  1 mg HS  Rec therapy bu t it's difficult given her location.  FU 4 mos  Rachael Macintosh, Rachael Callahan, Rachael Callahan   No future appointments.      No orders of the defined types were placed in this encounter.     -------------------------------

## 2023-09-30 ENCOUNTER — Other Ambulatory Visit: Payer: Self-pay

## 2023-09-30 ENCOUNTER — Telehealth: Payer: Self-pay | Admitting: Psychiatry

## 2023-09-30 DIAGNOSIS — F339 Major depressive disorder, recurrent, unspecified: Secondary | ICD-10-CM

## 2023-09-30 MED ORDER — METHYLPHENIDATE HCL ER (OSM) 54 MG PO TBCR
54.0000 mg | EXTENDED_RELEASE_TABLET | ORAL | 0 refills | Status: DC
Start: 2023-09-30 — End: 2023-11-20

## 2023-09-30 NOTE — Telephone Encounter (Signed)
 Pt states she can't remember if she is suppose to ween off the concerta  or not. If she is suppose to cont she doesn't have any refills.     [facility] [address]   [place]

## 2023-09-30 NOTE — Telephone Encounter (Signed)
 Pended

## 2023-10-05 ENCOUNTER — Telehealth: Admitting: Psychiatry

## 2023-10-08 ENCOUNTER — Other Ambulatory Visit: Payer: Self-pay | Admitting: Psychiatry

## 2023-10-08 DIAGNOSIS — F339 Major depressive disorder, recurrent, unspecified: Secondary | ICD-10-CM

## 2023-10-21 ENCOUNTER — Other Ambulatory Visit: Payer: Self-pay | Admitting: Psychiatry

## 2023-10-21 DIAGNOSIS — F5105 Insomnia due to other mental disorder: Secondary | ICD-10-CM

## 2023-10-23 ENCOUNTER — Ambulatory Visit (INDEPENDENT_AMBULATORY_CARE_PROVIDER_SITE_OTHER): Admitting: Psychiatry

## 2023-10-23 ENCOUNTER — Encounter: Payer: Self-pay | Admitting: Psychiatry

## 2023-10-23 DIAGNOSIS — F900 Attention-deficit hyperactivity disorder, predominantly inattentive type: Secondary | ICD-10-CM

## 2023-10-23 DIAGNOSIS — F5105 Insomnia due to other mental disorder: Secondary | ICD-10-CM | POA: Diagnosis not present

## 2023-10-23 DIAGNOSIS — G2581 Restless legs syndrome: Secondary | ICD-10-CM

## 2023-10-23 DIAGNOSIS — F339 Major depressive disorder, recurrent, unspecified: Secondary | ICD-10-CM | POA: Diagnosis not present

## 2023-10-23 MED ORDER — METHYLPHENIDATE HCL ER (OSM) 54 MG PO TBCR: 54.0000 mg | EXTENDED_RELEASE_TABLET | ORAL | 0 refills | Status: DC

## 2023-10-23 MED ORDER — PRAMIPEXOLE DIHYDROCHLORIDE ER 0.75 MG PO TB24: 1.0000 | ORAL_TABLET | Freq: Every day | ORAL | 0 refills | Status: DC

## 2023-10-23 MED ORDER — PRAMIPEXOLE DIHYDROCHLORIDE ER 1.5 MG PO TB24: 1.0000 | ORAL_TABLET | Freq: Every day | ORAL | 0 refills | Status: DC

## 2023-10-23 MED ORDER — NORTRIPTYLINE HCL 50 MG PO CAPS: 150.0000 mg | ORAL_CAPSULE | Freq: Every day | ORAL | 1 refills | Status: DC

## 2023-10-23 MED ORDER — QUETIAPINE FUMARATE 25 MG PO TABS: 50.0000 mg | ORAL_TABLET | Freq: Every day | ORAL | 1 refills | Status: DC

## 2023-10-23 NOTE — Progress Notes (Signed)
 Rachael Callahan [id number] [birth year] 78 y.o.  Virtual Visit via Telephone Note  I connected with pt by telephone and verified that I am speaking with the correct person using two identifiers.   I discussed the limitations, risks, security and privacy concerns of performing an evaluation and management service by telephone and the availability of in person appointments. I also discussed with the patient that there may be a patient responsible charge related to this service. The patient expressed understanding and agreed to proceed.  I discussed the assessment and treatment plan with the patient. The patient was provided an opportunity to ask questions and all were answered. The patient agreed with the plan and demonstrated an understanding of the instructions.   The patient was advised to call back or seek an in-person evaluation if the symptoms worsen or if the condition fails to improve as anticipated.  I provided 30 minutes of non-face-to-face time during this encounter.  The patient was located at home in [facility] and the provider was located office.  Session [id number] Difficult for patient to get here.   Subjective:   Patient ID:  Rachael Callahan is a 78 y.o. (DOB 1945/01/30) female.  Chief Complaint:  Chief Complaint  Patient presents with   Follow-up   Depression   Sleeping Problem    Kortny Lirette presents to the office today for follow-up of TRD.  Prior addition of Concerta  had helped with depression and productivity and interest and alertness.  visit August 2020.  She was doing generally well and no meds were changed.  03/2019 appt with the following noted: BP up here but reports it has been normal at home.   No new problems.  Still good On Concerta  to 54 mg daily and duloxetine  120 mg since Jan 2020.  Has been doing well.  Overall the combination of meds is effective.  More productive and more interested and no longer depressed.  No longer napping.   Busy summer.  D in law  brought 2 gkids from [place] and that was great.  GS 8 years older than adopted GD and they do well together. Thinks TMS helped some also. RLS managed usually with daily pramipexole . Xanax  use a couple times per month. Often over concerns with grand-daughter's mother who causes problems. Minimal anxiety .  Sleep good and less napping.  Patient reports stable mood and denies depressed or irritable moods.  Patient denies any recent difficulty with anxiety.  Patient denies difficulty with sleep initiation or maintenance. Night owl and gets 7-8 hours.  No napping.   Denies appetite disturbance.  Patient reports that energy and motivation have been good.  Patient denies any difficulty with concentration.  Patient denies any suicidal ideation. Concerned about GD who's not doing well with psych treatment.  Had seen [name] before but transferred.  GD Bipolar disorder dx.  Seen at [facility].  Also had hosp.  Plan:  No meds changed  10/11/19 appt with the following noted: Covid free.  Vaccinated. Pretty good mood. Satisfied with meds otherwise. Last week problems with RLS after being stable for awhile.  No more caffeine than usual. No SE. Patient reports stable mood and denies depressed or irritable moods.  Patient denies any recent difficulty with anxiety.  Patient denies difficulty with sleep initiation or maintenance. Denies appetite disturbance.  Patient reports that energy and motivation have been good.  Patient denies any difficulty with concentration.  Patient denies any suicidal ideation. Uses Xanax  occ. [name] well. GD 26  and needs for insurance to see doc in [facility]. Plan: no changes  04/11/2020 appointment with the following noted: Since here concerta  increased to 72 mg AM.  I think it's what I needed.  More motivated and energetic and productive but not normal. Still on duloxetine  120 without SE.   Mood good.  Patient reports stable mood and denies depressed or irritable  moods.  Patient denies any recent difficulty with anxiety.  Patient denies difficulty with sleep initiation or maintenance, but night owl so sleep is messed up with retirement. Denies appetite disturbance.  Patient reports that energy and motivation have been good.  Patient denies any difficulty with concentration.  Patient denies any suicidal ideation. RLS managed.   Plan :  No med changes and she agrees.  10/11/2020 appointment with the following noted: Xanax  3-4 times per month. RLS managed with pramipexole  and muscle relaxing cream. No sig depression.  Satisfied with meds.  Occ situational mood problems. Patient reports stable mood and denies depressed or irritable moods.  Patient denies any recent difficulty with anxiety.  Patient denies difficulty with sleep initiation or maintenance. Denies appetite disturbance.  Patient reports that energy and motivation have been good.  Patient denies any difficulty with concentration.  Patient denies any suicidal ideation. Stress GD married last year to cocaine addict. No SE Initial insomnia at night but can sleep better in the day. 1 cup AM coffee. Plan: No med changes  02/26/2021 phone call from husband and also discussed with patient.  MD note as follows: RTC  [phone] [name] She's been in bed for a couple of days and doesn't feel life is worth living.  Not directly voiced SI.  Only like this for a couple of days.  She doesn't desire to go anywhere.  He knows of no trigger except ongoing family issues.  Disc with pt Felt bad for a couple of mos after virus in December.  Not Covid.  Cough ongoing.  Don't feel like doing anything.  No energy.  No interest.  Everything seems like too much to deal with.  No med changes. No SI but feels useless.  Seemed OK prior to virus. Change in Concerta  recently about a week or 2 ago.  Plan: Would prefer something that could have a fairly quick effect and not have to change the Cymbalta  given her history of  doing well on it until recently. She did not respond to Abilify or Rexulti but she might respond to Vraylar  1.5 mg every morning.  It is quick-acting and compatible with her duloxetine  and Concerta .  Disc discussed side effects.  She is not suicidal and does not need hospitalization at this time. Her husband will come pick up samples of Vraylar  1.5 mg 1 every morning starting tomorrow.  Lorene Macintosh, MD, [facility]  04/30/2021 appointment with the following noted: Several phone calls since she was here. Took Vryalar for a week and saw benefit witout SE and then stopped it. Felt fine for awhile.  Then had hard time last weekend and took another Vraylar  bc felt weepy. Today feels fine with mood..  Generally low anxiety. Rare alprazolam . Taking generic Concerta  without a problem. No SE. Not much dep in last 6 mos except as noted. GD pregnant with first [name].  Concerned about GD physical's and mental health. GD married.   GD sober. Plan: continue Concerta  72, duloxetine  120  05/21/21 took Vraylar  again for depression and felt better and rX sent.  Cost was a problem so PT  assistance forms mailed to her.  07/12/21 TC complaining of sleepiness and relating it to Vraylar .  Dose reduced to QOD  08/01/21 No show  10/03/21 TC complaining of depression. Sent Rx mirtazapine  30 mg HS.  Still taking Vraylar  1.5 QOD.  Was told to stop it since it was apparently no lonnger helping  12/10/21 appt noted:  seen with [name] A lot of phone calls since here with depressive sx. Current psych meds: duloxetine  90, lithium  300, Ritalin  72 AM, mirtazapine  30. Depression worse in the last couple of week. SE lithium  tremor and jittery.  Therefore needing alprazolam  more often. Last free of dep about 6 weeks ago. Sleeping too much.  Sleep to escape depression.   Asks about Zoloft.   Plan: Plan: Stop mirtazapine  and lithium  Reduce duloxetine  to 60 mg daily for 1 week then 30 mg daily for 1 week then stop duloxetine  Start  Auvelity  1 in the AM for 1 week then 1 twice daily as long as dizziness is not a problem.  02/24/22 appt noted: Several phone calls since she was here.  Jittery on Auvelity  and stopped it. Referral sent for TMS. Then restarted Auvelity  and wanted a prescription February 07, 2022 Taking Auvelity  twice daily since here.   Meds: Auvelity  BID (AM & HS), Concerta  72 AM, pramipexole  1 mg HS.  No SE Didn't notice anything profound coming off other meds but does feel better generally.  Doesn't feel worse.  Stressful family sensation. Feels a little odd sometimes like I'm not totally engaged and don't have energy she wisshes long term.  Depression is better but not gone. Sleep is good.  Appetite good. No sig RLS with pramipexole . Planning to move to where D lives  in [place] and build house. Enjoys theater and music. Plan: continue Auvelity  BID, Concerta  36 AM, pramipexole  1 mg HS Still being evaluated for TMS  04/29/22 appt noted: Several phone calls since here with various problems. Weaning TMS  and maybe somewhat helpful. Still so low energy.  Days of feeling sad withotu reason.  No recent crying spells-TMS helped.  Better motivation later in the day.  Not much interest or enjoyment.   No change off duloxetine  and on Auvelity . No SE now.   Plan: Plan: DT NR reduce Concerta  36 AM,  Off label for TRD increase pramipexole  to 0.5 mg AM and 1 mg HS Tapering TMS DC  AUVELITY  Start TCA nortriptyline  25 up to 75 mg HS  05/08/22 TC no benefit pramipexole  Plan:  Increase pramipexole  to 1 mg BID if tolerated if she has SE then split the AM dose to 1/2 in the AM and noon and 1 at night.     06/12/22 appt noted: Meds; concerta  36 AM, nortriptyline  75 HS, pramipexole  1 mg BID. Finished TMS end of April Seemed to level out with meds.  Not severe downs or crying in the last couple of weeks.  Tire easily.  Motivation so so.  A little better interest and enjoyment.  Not required to do much. Dep 4/10.    No SE noted except dry mouth.  No N or dizziness.   Needs to lose wt.   No RLS.   Sleep ok.   Plan: continue Concerta  36 AM,  Off label for TRD increase pramipexole  to 1 mg TID  nortriptyline  75 mg HS  07/23/22 appt noted: Finished TMS end of April 2024 Meds as above. No SE except dry mouth.  No sleepiness or N or revved up. No  benefit with increase pramipexole .  Wants to feel better.  Andhedonia.  Didn't enjoy beach trip and should have.  Occ crying spells.    Good relationship with D and son in law.  Moving to live with them in [facility] [place] [facility].  Looking forward to it. No RLS. Pending knee surgery 08/26/22 Sleep ok.   Plan: Off label for TRD increase pramipexole  to 1 mg QID DT NR or SE If NR in 2 weeks then call and will increase to max 5 mg daily. Check nortriptyline  level .    07/28/22  Amitriptyline  Not Estab. ng/mL <20    Nortriptyline  Not Estab. ng/mL 89  Amitriptyline  & Nortriptyline , Total 80 - 200 ng/mL   Total <109 Plan increase to 100 mg HS from 75 mg HS  09/23/22 appt noted; Psych med; Xanax  0.25 mg prn, Concerta  54 AM, nortrip 100 HS, pramipexole  1 mg QID,  SE dryness Had TKR on R 08/26/22.  No opiates. Feeling better but not sure why.  Pretty quickly after here.  Overall mood is better.  Dep 1-2/10 is clearly better.  Can enjoy some things esp great grand babies.   GD Ariel has baby Iris.  Doing well.   Asks about incr prn Xanax .  Needs some to help with sleep bc awakening for an hour.  Some related to knee discomfort. [name] just retired.  Plan to sell house and move to [facility] where D located.  Plan: Plan: continue Concerta  36 AM,  nortriptyline  100 mg HS Off label for TRD increase pramipexole  to 1 mg QID helpful  11/27/22 appt noted: Psych med; Xanax  0.25 mg prn, Concerta  54 AM, nortrip 100 HS, pramipexole  1 mg QID (missing some),  SE dryness Still dealing with issues with TKR.  Doesn't think she is doing well enough.  Will have to go back to PT.  It is  limiting activity and movement.  Gained 12 # since surgery.   Mood so so , not great.   Working to put house on [employer].  Preparing for this has been difficult.   H has good attitude.   Asked about therapy Plan: med changes today; switch to ER to improve compliance pramipexole  4.5 mg daily, Concerta  54 AM, nortrip 100 HS,    Xanax  0.25 mg HS.  02/04/23 appt noted: Psych meds as above.  Consistent with meds.   SE dryness.   Mood pretty good overall.   Moved to [place] and will purchase mobile home there near D.  Staying with D and son in law.  Moving is stressful.  Will be 6-8 mos until move complete DT construction time.   [name] is supportive and stable. She thinks pramipexole  is helping.  Sleep can be mixed up with napping affecting night time sleep.  Getting enough sleep.  06/04/23 appt noted: Med: No med changes today; switched prampexole to ER with better compliance pramipexole  4.5 mg daily,  Concerta  54 AM, nortrip 100 HS,   Xanax  0.25 mg HS.   Really upset about being late and being told she couldn't be seen today.  Then pt after her cancelled and left.  And she could therefore be seen.  Drove from [facility].  Had trouble with GPS trying to get here.   Consistent. Fair .  A lot going on.  House being finished.  A lot to deal with. Can be productivity.   No SE Since knee surgery July gained 40#.  Seeing GP.  Started Ozempic.  No benefit.  Comfort food eating.  Mobility so so.  Doesn't feel like surgery helped.   No impulsivity , compulsivity. Can't think of any other concerns.   Living in D's house and it's OK.   Plan: no med changes.  Refer to therapy.  09/11/23 TC:  Pt reporting having trouble sleeping. Can usually get to sleep, but doesn't stay asleep. Estimates getting 5 hours. Lots of situational things. Rates depression 5-6/10, anxiety 4-5/10, but it fluctuates. Reports having a consultation to see about another sleep study with Dr. Modesto. H reports she snores. Abnormal  TSH and lipids.   Reports can sleep with alprazolam , but takes more than prescribed in order to do that. Reports took 8 ZzzQuill one night last week because she needed to sleep and she slept, but when she woke up she was searching her house for family members who were no longer there. When asked about caffeine use she said she drank a lot of VIA.  I was not able to find this online to see what the caffeine content was. She was not home, but said it was in a plastic bottle, not the [employer] product.  MD resp:  Avoid caffeine after noon.  Ok to increase alprazolam  to 2-3 of the 0.25 mg tablets at bedtime.  If it doesn't work drop dose back down and call us .    Lorene Macintosh, MD, DFAPA  09/17/23 TC:  Pt reported still not sleeping thru the night with alprazolam  0.25 2-3 tablets. Reports she is up and down. Reports moved 6 months ago and said she doesn't feel like she has slept the entire night since the move, still unpacking. She said she felt like a new mother - sleep deprived.  Pended Rx earlier for alprazolam . Previously tried mirtazapine  but reported SE.     MD resP ;  Stop alprazolam  and start clonazepam  1 or 1 and 1/2 tablets at night.  Will take 15 min longer to kick in than does alprazolam .  Should keep her asleep longer.     Lorene Macintosh, MD, DFAPA       09/18/23 TC:  Pt took clonazepam  last night, took one tablet and took another 1/2 when she woke up at 2 AM. [place] she is still not sleeping, unable to function because she is so tired, is worsening her depression and she is angry and tearful. Reports that no one seems to understand that medications affect her differently. She reports still taking methylphenidate  54 mg in AM. She had reported earlier this week drinking a VIA drink, which does have some caffeine. Reported today she isn't drinking them that much. She is sleeping, but only about 5 hours today a day and she needs more. Denied daytime napping.     MD resp: Clonazepam  and  alprazolam  cannot be stopped abruptly. 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 If this works we will discuss weaning clonazepam  when I see her. Remind her of her upcoming appt and be sure to keep it. Lorene Macintosh, MD, DFAPA  09/29/23 TC:  Called pt to see how she slept with the addition of Seroquel  25 mg. She reported she didn't get any more sleep. Reports getting 2 hours of sleep. She did report after having breakfast she took a 2 hour nap.  She has a consultation for a sleep study on 9/2. She reported last week being irritable before she wasn't sleeping. She verbalized the same today, but her affect was brighter today than  it was last week.     MD resp:  If she tolerated it ok then increase to 2-3 of the 25 mg Seroquel  tablets.  Lorene Macintosh, MD, DFAPA  09/29/23 appt noted:  Med: clonazepam  1 mg and quetiapine  50 mg HS, Concerta  54 AM, nortriptyline  100 mg , Pramipexole  ER 4.5 mg daily. No SE.  Consistent. Last night slept in 2 hour spurts with awakening in between.  Got 6 hours. After  More trouble with sleep for no reason but correlates with move to [facility]. Sleep study consultation tomorrow.  No consistent RLS .  Not weekly. Still dealing with depression.   A lot of changes in our lives.  I get aggivated I don't feel better than I do. If were not a [place], I might have taken my own life.  Last not dep for over 2 weeks a few months ago.  No sense of purpose.   Get tired of taking meds.   Dep 8/10.  Just finished house and moved in in May.   Plan: Increase nortriptyline  150 mg HS for tRD.    10/23/23 appt noted: Med; Increase nortriptyline  150 mg HS 3 weeks,  clonazepam  1 mg and quetiapine  50 mg HS, Concerta  54 AM,  reduced Pramipexole  ER 2.25 mg daily. 3 different times of having dreams and awakening thinking maybe it was real.  Awoke confused but brief.   Mild improvements, not as depressed as I did.  3/10.  A little better conc.  Still low  energy.  Has some motivation to improve function.  No other SE.   Balance not great; worse in last couple of months.  No falls. Sleep still not as good as I like DT EMA but a little less than it did.  Can usually go back to sleep.  Skipped clonazepam  last couple of days.  Has occ napped in the afternoon.   Psychiatric medications tried include  fluoxetine,  venlafaxine,  duloxetine  120 + Concerta  54, bupropion,  Trintellix, lithium ,  Mirtazapine  30 without help for depression Auvelity  NR Nortriptyline  100  pramipexole  1 QID  Latuda 40 mg,  Rexulti 2 mg with no benefit,  Vraylar  1.5 had benefit and then lost it.  TMS helped, 2019  ; repeat 04/2022  F hosp for dep.   Sister also with dep.  Review of Systems:  Review of Systems  Cardiovascular:  Negative for chest pain and palpitations.  Musculoskeletal:  Positive for arthralgias.  Neurological:  Negative for dizziness, tremors and weakness.       RLS off and on  Psychiatric/Behavioral:  Positive for dysphoric mood and sleep disturbance. Negative for agitation, behavioral problems, confusion, decreased concentration, hallucinations, self-injury and suicidal ideas. The patient is not nervous/anxious and is not hyperactive.     Medications: I have reviewed the patient's current medications.  Current Outpatient Medications  Medication Sig Dispense Refill   Cholecalciferol 125 MCG (5000 UT) TABS Take 5,000 Units by mouth daily.     methocarbamol  (ROBAXIN ) 500 MG tablet Take 1 tablet (500 mg total) by mouth every 6 (six) hours as needed for muscle spasms (muscle pain). 40 tablet 2   methylphenidate  (CONCERTA ) 54 MG PO CR tablet Take 1 tablet (54 mg total) by mouth every morning. 30 tablet 0   methylphenidate  54 MG PO CR tablet Take 1 tablet (54 mg total) by mouth daily. 30 tablet 0   metoprolol  succinate (TOPROL -XL) 50 MG 24 hr tablet Take 50 mg by mouth daily.     Multiple  Vitamins-Minerals (MULTIVITAMIN ADULT PO) Take 1 tablet by  mouth daily.     olmesartan (BENICAR) 40 MG tablet Take 40 mg by mouth daily.     omeprazole (PRILOSEC) 40 MG capsule Take 40 mg by mouth daily.  1   OZEMPIC, 0.25 OR 0.5 MG/DOSE, 2 MG/3ML SOPN Inject 0.5 mg into the skin once a week.     polyethylene glycol (MIRALAX  / GLYCOLAX ) 17 g packet Take 17 g by mouth 2 (two) times daily. 14 each 0   [START ON 11/06/2023] Pramipexole  Dihydrochloride 0.75 MG TB24 Take 1 tablet (0.75 mg total) by mouth daily. 15 tablet 0   Pramipexole  Dihydrochloride 1.5 MG TB24 Take 1 tablet (1.5 mg total) by mouth daily. 15 tablet 0   pravastatin  (PRAVACHOL ) 20 MG tablet Take 20 mg by mouth at bedtime.  3   Probiotic Product (PROBIOTIC-10 PO) Take 1 tablet by mouth daily.     methylphenidate  (CONCERTA ) 54 MG PO CR tablet Take 1 tablet (54 mg total) by mouth every morning. 30 tablet 0   nortriptyline  (PAMELOR ) 50 MG capsule Take 3 capsules (150 mg total) by mouth at bedtime. 90 capsule 1   QUEtiapine  (SEROQUEL ) 25 MG tablet Take 2-3 tablets (50-75 mg total) by mouth at bedtime. 90 tablet 1   No current facility-administered medications for this visit.    Medication Side Effects: None  Allergies: No Known Allergies  Past Medical History:  Diagnosis Date   Arthritis    Depression    High cholesterol    Hypertension    Migraine    Stomach ulcer     History reviewed. No pertinent family history.  Social History   Socioeconomic History   Marital status: Married    Spouse name: Not on file   Number of children: Not on file   Years of education: Not on file   Highest education level: Not on file  Occupational History   Not on file  Tobacco Use   Smoking status: Never   Smokeless tobacco: Never  Vaping Use   Vaping status: Never Used  Substance and Sexual Activity   Alcohol use: Yes    Comment: rarely   Drug use: Never   Sexual activity: Not on file  Other Topics Concern   Not on file  Social History Narrative   Not on file   Social Drivers of  Health   Financial Resource Strain: Not on file  Food Insecurity: No Food Insecurity (08/26/2022)   Hunger Vital Sign    Worried About Running Out of Food in the Last Year: Never true    Ran Out of Food in the Last Year: Never true  Transportation Needs: No Transportation Needs (08/26/2022)   PRAPARE - Administrator, Civil Service (Medical): No    Lack of Transportation (Non-Medical): No  Physical Activity: Not on file  Stress: Not on file  Social Connections: Not on file  Intimate Partner Violence: Not At Risk (08/26/2022)   Humiliation, Afraid, Rape, and Kick questionnaire    Fear of Current or Ex-Partner: No    Emotionally Abused: No    Physically Abused: No    Sexually Abused: No    Past Medical History, Surgical history, Social history, and Family history were reviewed and updated as appropriate.   Please see review of systems for further details on the patient's review from today.   Objective:   Physical Exam:  There were no vitals taken for this visit.  Physical Exam Constitutional:  General: She is not in acute distress.    Appearance: She is well-developed.  Musculoskeletal:        General: No deformity.  Neurological:     Mental Status: She is alert and oriented to person, place, and time.     Motor: No tremor.     Coordination: Coordination normal.     Gait: Gait normal.  Psychiatric:        Attention and Perception: Perception normal. She is attentive.        Mood and Affect: Mood is depressed. Mood is not anxious. Affect is not labile, blunt, angry or inappropriate.        Speech: Speech normal.        Behavior: Behavior normal. Behavior is not slowed.        Thought Content: Thought content normal. Thought content is not delusional. Thought content does not include homicidal or suicidal ideation. Thought content does not include suicidal plan.        Cognition and Memory: Cognition normal.        Judgment: Judgment normal.     Comments:  Insight intact. No auditory or visual hallucinations. No delusions.  Talkative without pressure Dep 3/10.  No death thoughts.  No SI      Lab Review:     Component Value Date/Time   NA 136 08/27/2022 0410   K 4.5 08/27/2022 0410   CL 103 08/27/2022 0410   CO2 25 08/27/2022 0410   GLUCOSE 182 (H) 08/27/2022 0410   BUN 13 08/27/2022 0410   CREATININE 0.74 08/27/2022 0410   CALCIUM 8.7 (L) 08/27/2022 0410   GFRNONAA >60 08/27/2022 0410   GFRAA >60 05/14/2017 0917       Component Value Date/Time   WBC 9.9 08/27/2022 0410   RBC 3.78 (L) 08/27/2022 0410   HGB 11.7 (L) 08/27/2022 0410   HCT 35.9 (L) 08/27/2022 0410   PLT 205 08/27/2022 0410   MCV 95.0 08/27/2022 0410   MCH 31.0 08/27/2022 0410   MCHC 32.6 08/27/2022 0410   RDW 13.0 08/27/2022 0410   LYMPHSABS 1.4 05/14/2017 0917   MONOABS 0.5 05/14/2017 0917   EOSABS 0.2 05/14/2017 0917   BASOSABS 0.0 05/14/2017 0917    07/28/22  on 75 mg daily. Amitriptyline  Not Estab. ng/mL <20    Nortriptyline  Not Estab. ng/mL 89  Amitriptyline  & Nortriptyline , Total 80 - 200 ng/mL   Total <109  .res Assessment: Plan:    Malyah was seen today for follow-up, depression and sleeping problem.  Diagnoses and all orders for this visit:  Recurrent major depression resistant to treatment -     methylphenidate  (CONCERTA ) 54 MG PO CR tablet; Take 1 tablet (54 mg total) by mouth every morning. -     Pramipexole  Dihydrochloride 1.5 MG TB24; Take 1 tablet (1.5 mg total) by mouth daily. -     Pramipexole  Dihydrochloride 0.75 MG TB24; Take 1 tablet (0.75 mg total) by mouth daily. -     nortriptyline  (PAMELOR ) 50 MG capsule; Take 3 capsules (150 mg total) by mouth at bedtime.  Attention deficit hyperactivity disorder (ADHD), predominantly inattentive type  Insomnia due to mental condition -     QUEtiapine  (SEROQUEL ) 25 MG tablet; Take 2-3 tablets (50-75 mg total) by mouth at bedtime.  Restless legs syndrome   30 min face to face  time with patient .  severe depression in Jan 2023 getting better then worse.  But has had a number of bouts of depression in 2023  and since.  Overall seems to have high tolerance of meds.   Was better with pramipexole  1 mg QID but relapsed so weaning.  Hx benefit nortriptyline  with low blood level but relapsed.  So increased to 150 mg HS  Discussed potential benefits, risks, and side effects of stimulants with patient to include increased heart rate, palpitations, insomnia, increased anxiety, increased irritability, or decreased appetite.  Instructed patient to contact office if experiencing any significant tolerability issues.  Extensive discussion of tx options including repeating TMS, Spravato, TCA.  , MAOI,  lamotrigine.   Consider low dose lithium  with nortriptyline  consider selegiline, higher dose quetiapine ,   Disc SE each type of med including manic type sx.  Extensive disc of alternative Spravato.  07/28/22  On  100 mg HS. Amitriptyline  Not Estab. ng/mL <20    Nortriptyline  Not Estab. ng/mL 89  Amitriptyline  & Nortriptyline , Total 80 - 200 ng/mL   Total <109  Increased nortriptyline  150 mg HS for tRD.    DT relapse of depression start weaning pramipexole  to ER every 2 weeks.  Concerta  54 AM,   DT TR insomnia  continue quetiapine  75 HS .    Hold clonazepam  0.5-1 mg HS if possible.  Rec therapy but it's difficult given her location.  FU 4 weeks  Lorene Macintosh, MD, DFAPA   No future appointments.      No orders of the defined types were placed in this encounter.     -------------------------------

## 2023-10-30 ENCOUNTER — Other Ambulatory Visit: Payer: Self-pay | Admitting: Psychiatry

## 2023-10-30 ENCOUNTER — Telehealth: Payer: Self-pay | Admitting: Psychiatry

## 2023-10-30 DIAGNOSIS — F339 Major depressive disorder, recurrent, unspecified: Secondary | ICD-10-CM

## 2023-10-30 MED ORDER — METHYLPHENIDATE HCL ER (OSM) 54 MG PO TBCR: 54.0000 mg | EXTENDED_RELEASE_TABLET | Freq: Every day | ORAL | 0 refills | Status: DC

## 2023-10-30 NOTE — Telephone Encounter (Signed)
 Pt called with several questions. She has a RF available on Concerta  and she can pick up now. She knows to take 150 mg nortriptyline . She reports confusion about pramipexole . Said picked up 0.75 mg first and yesterday picked up 1.5 mg. She has been taking 0.75 for about a week, has not taken the 1.5 mg dose.

## 2023-10-30 NOTE — Telephone Encounter (Signed)
 She is taking the nortriptyline  as prescribed, but she picked up pramipexole  0.75 first and she had been taking that. She just picked up the 1.5 yesterday but hasn't taken yet. She is doing backwards from what she is supposed to.

## 2023-10-30 NOTE — Telephone Encounter (Signed)
 Sent [facility]  At last appt we increased nortriptyline  to 3 of the 50 mg capsules.  Take that.  Has she been taking that dose since the appt?  The bottles should say this, but we reduced the prampexole ER to 1.5 mg at last appt.  She takes this until 10/10 when she should start the 0.75 mg dose for 2 weeks then stop it.

## 2023-10-30 NOTE — Telephone Encounter (Signed)
 Pt LVM @ 9:15a.  She has 3 medication issues.  She said she on the Pramipexole , she has been taking .75 mg but she just picked up 1.50mg  at the pharmacy.  she also has 2.25 mg pills.  She wants to know what dose should she be taking. Regarding Methylphendiate, she said she has 4 days left and she's going to be out of town next week, she's trying to get that sent in early (didn't state which pharmacy). Regarding Nortriptyline , she has been taking 3 a night, but her bottle says to take 2 a night.  She needs to confirm dosage on that one.  Next appt 10/24

## 2023-11-02 NOTE — Telephone Encounter (Signed)
 Don't go back up in pramipexole  to 1.5 unless she hasn't taken it yet.  If she is taking 0.75 mg then stay on it  for 2 weeks and then stop it.  11/04/23 To clarify: Pramipexole  did not work for her depression.  At her last visit on 10/23/2023 she was taking pramipexole  ER 2.25 mg daily.  Because of its ineffectiveness we plan to wean her off of the medication.  I gave her a prescription for pramipexole  ER 1.5 mg daily which she was to take for 2 weeks.  Also gave her prescription of pramipexole  ER 0.75 mg which she was then to take after completing the 1.5 mg dosage.  She is to take the pramipexole  ER 0.75 mg for 2 weeks and then stop it. However she apparently picked up the pramipexole  ER 0.75 mg first and took that for an unknown period of time.  Then she picked up the pramipexole  ER 1.5 mg dose. I do not want her to go back up and the dose of pramipexole .  Once she has completed pramipexole  ER 0.75 mg for 2 weeks she can stop it. Lorene Macintosh, MD, DFAPA

## 2023-11-03 ENCOUNTER — Other Ambulatory Visit: Payer: Self-pay | Admitting: Psychiatry

## 2023-11-03 DIAGNOSIS — F339 Major depressive disorder, recurrent, unspecified: Secondary | ICD-10-CM

## 2023-11-04 NOTE — Telephone Encounter (Signed)
 LM for pt to call back to discuss her dose of medication.

## 2023-11-05 ENCOUNTER — Telehealth: Payer: Self-pay | Admitting: Psychiatry

## 2023-11-05 NOTE — Telephone Encounter (Signed)
LM to call back to discuss.

## 2023-11-05 NOTE — Telephone Encounter (Signed)
 Pt lvm calling Traci back. She is in hospital in [place] other city. Phone cut out. I heard .75 mg

## 2023-11-06 NOTE — Telephone Encounter (Signed)
 Pt is admitted to a hospital per admin staff

## 2023-11-06 NOTE — Telephone Encounter (Signed)
 I have not been able to verify any doses with her. Per admin staff she called back stating she was admitted to the hospital.

## 2023-11-08 ENCOUNTER — Other Ambulatory Visit: Payer: Self-pay | Admitting: Psychiatry

## 2023-11-08 DIAGNOSIS — F339 Major depressive disorder, recurrent, unspecified: Secondary | ICD-10-CM

## 2023-11-09 NOTE — Telephone Encounter (Signed)
 Pt had an overnight hospital stay in [place] for neuropathy. Reported she was numb from the waist down. She is taking 0.75 mg of pramipexole , has been on about a week. She said she wasn't sure why she was taking it and can't say if anything is better or worse. Note said she was to wean due to relapse of depression. Has not taken 1.5 mg at all.

## 2023-11-09 NOTE — Telephone Encounter (Signed)
 Ok to stop pramipexole  which was for depression and didn't help.

## 2023-11-09 NOTE — Telephone Encounter (Signed)
 D/C

## 2023-11-10 NOTE — Telephone Encounter (Signed)
 Patient advised.

## 2023-11-16 ENCOUNTER — Other Ambulatory Visit: Payer: Self-pay | Admitting: Psychiatry

## 2023-11-16 DIAGNOSIS — G2581 Restless legs syndrome: Secondary | ICD-10-CM

## 2023-11-16 NOTE — Telephone Encounter (Signed)
 I don't want her taking this and clonazepam .  It looks like she last filled clonazepam  a couple of mos ago and filled alprazolam  last month but it is not documented in my note.  I don't want her routinely taking alprazolam  during the day bc she's taking a stimulant.  Why is she wanting the alprazolam  RX, for sleep or anxiety?

## 2023-11-17 ENCOUNTER — Telehealth: Payer: Self-pay | Admitting: Psychiatry

## 2023-11-17 NOTE — Telephone Encounter (Addendum)
 Reviewed list of previous meds from chart and patient had tried Rexulti previously without benefit. I told her Zepbound was for used for obesity, but does say approved for OSA in obese patients.

## 2023-11-17 NOTE — Telephone Encounter (Signed)
 Pt left vm asking if she has taken Rexultii and if she can try it. Also wants to know if Dr. Geoffry has heard of a sleep apnea drug called Zepbound. She is having a sleep study at home today and just wanted to know.

## 2023-11-17 NOTE — Telephone Encounter (Signed)
 From 9/26 visit:  Hold clonazepam  0.5-1 mg HS if possible.   Clonazepam  was discontinued, reported not taking. LF 8/21, has not requested any RF. Has only had one fill this year.   She reports needs the alprazolam  for anxiety and sleep. Doesn't take it daily.

## 2023-11-18 ENCOUNTER — Other Ambulatory Visit: Payer: Self-pay | Admitting: Psychiatry

## 2023-11-18 DIAGNOSIS — F339 Major depressive disorder, recurrent, unspecified: Secondary | ICD-10-CM

## 2023-11-19 ENCOUNTER — Other Ambulatory Visit: Payer: Self-pay | Admitting: Psychiatry

## 2023-11-19 DIAGNOSIS — F339 Major depressive disorder, recurrent, unspecified: Secondary | ICD-10-CM

## 2023-11-20 ENCOUNTER — Ambulatory Visit: Admitting: Psychiatry

## 2023-11-20 ENCOUNTER — Encounter: Payer: Self-pay | Admitting: Psychiatry

## 2023-11-20 DIAGNOSIS — F900 Attention-deficit hyperactivity disorder, predominantly inattentive type: Secondary | ICD-10-CM | POA: Diagnosis not present

## 2023-11-20 DIAGNOSIS — F5105 Insomnia due to other mental disorder: Secondary | ICD-10-CM | POA: Diagnosis not present

## 2023-11-20 DIAGNOSIS — G2581 Restless legs syndrome: Secondary | ICD-10-CM | POA: Diagnosis not present

## 2023-11-20 DIAGNOSIS — F339 Major depressive disorder, recurrent, unspecified: Secondary | ICD-10-CM | POA: Diagnosis not present

## 2023-11-20 MED ORDER — LITHIUM CARBONATE 150 MG PO CAPS: 150.0000 mg | ORAL_CAPSULE | Freq: Every evening | ORAL | 0 refills | Status: AC

## 2023-11-20 MED ORDER — METHYLPHENIDATE HCL ER (OSM) 54 MG PO TBCR
54.0000 mg | EXTENDED_RELEASE_TABLET | ORAL | 0 refills | Status: DC
Start: 2023-11-20 — End: 2023-12-23

## 2023-11-20 MED ORDER — METHYLPHENIDATE HCL ER (OSM) 54 MG PO TBCR
54.0000 mg | EXTENDED_RELEASE_TABLET | ORAL | 0 refills | Status: DC
Start: 2023-12-18 — End: 2023-12-23

## 2023-11-20 NOTE — Progress Notes (Signed)
 Rachael Callahan [id number] 1945/09/26 78 y.o.  Virtual Visit via Telephone Note  I connected with pt by telephone and verified that I am speaking with the correct person using two identifiers.   I discussed the limitations, risks, security and privacy concerns of performing an evaluation and management service by telephone and the availability of in person appointments. I also discussed with the patient that there may be a patient responsible charge related to this service. The patient expressed understanding and agreed to proceed.  I discussed the assessment and treatment plan with the patient. The patient was provided an opportunity to ask questions and all were answered. The patient agreed with the plan and demonstrated an understanding of the instructions.   The patient was advised to call back or seek an in-person evaluation if the symptoms worsen or if the condition fails to improve as anticipated.  I provided 30 minutes of non-face-to-face time during this encounter.  The patient was located at home in [facility] and the provider was located office.  Session [phone] Difficult for patient to get here.   Subjective:   Patient ID:  Rachael Callahan is a 78 y.o. (DOB [birth year]) female.  Chief Complaint:  Chief Complaint  Patient presents with   Follow-up   Depression   Anxiety   Sleeping Problem    Rachael Callahan presents to the office today for follow-up of TRD.  Prior addition of Concerta  had helped with depression and productivity and interest and alertness.  visit August 2020.  She was doing generally well and no meds were changed.  03/2019 appt with the following noted: BP up here but reports it has been normal at home.   No new problems.  Still good On Concerta  to 54 mg daily and duloxetine  120 mg since Jan 2020.  Has been doing well.  Overall the combination of meds is effective.  More productive and more interested and no longer depressed.  No longer napping.   Busy summer.   D in law brought 2 gkids from [place] and that was great.  GS 8 years older than adopted GD and they do well together. Thinks TMS helped some also. RLS managed usually with daily pramipexole . Xanax  use a couple times per month. Often over concerns with grand-daughter's mother who causes problems. Minimal anxiety .  Sleep good and less napping.  Patient reports stable mood and denies depressed or irritable moods.  Patient denies any recent difficulty with anxiety.  Patient denies difficulty with sleep initiation or maintenance. Night owl and gets 7-8 hours.  No napping.   Denies appetite disturbance.  Patient reports that energy and motivation have been good.  Patient denies any difficulty with concentration.  Patient denies any suicidal ideation. Concerned about GD who's not doing well with psych treatment.  Had seen [name] before but transferred.  GD Bipolar disorder dx.  Seen at [facility].  Also had hosp.  Plan:  No meds changed  10/11/19 appt with the following noted: Covid free.  Vaccinated. Pretty good mood. Satisfied with meds otherwise. Last week problems with RLS after being stable for awhile.  No more caffeine than usual. No SE. Patient reports stable mood and denies depressed or irritable moods.  Patient denies any recent difficulty with anxiety.  Patient denies difficulty with sleep initiation or maintenance. Denies appetite disturbance.  Patient reports that energy and motivation have been good.  Patient denies any difficulty with concentration.  Patient denies any suicidal ideation. Uses Xanax  occ. [name]  well. GD 26 and needs for insurance to see doc in [facility]. Plan: no changes  04/11/2020 appointment with the following noted: Since here concerta  increased to 72 mg AM.  I think it's what I needed.  More motivated and energetic and productive but not normal. Still on duloxetine  120 without SE.   Mood good.  Patient reports stable mood and denies depressed or  irritable moods.  Patient denies any recent difficulty with anxiety.  Patient denies difficulty with sleep initiation or maintenance, but night owl so sleep is messed up with retirement. Denies appetite disturbance.  Patient reports that energy and motivation have been good.  Patient denies any difficulty with concentration.  Patient denies any suicidal ideation. RLS managed.   Plan :  No med changes and she agrees.  10/11/2020 appointment with the following noted: Xanax  3-4 times per month. RLS managed with pramipexole  and muscle relaxing cream. No sig depression.  Satisfied with meds.  Occ situational mood problems. Patient reports stable mood and denies depressed or irritable moods.  Patient denies any recent difficulty with anxiety.  Patient denies difficulty with sleep initiation or maintenance. Denies appetite disturbance.  Patient reports that energy and motivation have been good.  Patient denies any difficulty with concentration.  Patient denies any suicidal ideation. Stress GD married last year to cocaine addict. No SE Initial insomnia at night but can sleep better in the day. 1 cup AM coffee. Plan: No med changes  02/26/2021 phone call from husband and also discussed with patient.  MD note as follows: RTC  [phone] Rachael Callahan She's been in bed for a couple of days and doesn't feel life is worth living.  Not directly voiced SI.  Only like this for a couple of days.  She doesn't desire to go anywhere.  He knows of no trigger except ongoing family issues.  Disc with pt Felt bad for a couple of mos after virus in December.  Not Covid.  Cough ongoing.  Don't feel like doing anything.  No energy.  No interest.  Everything seems like too much to deal with.  No med changes. No SI but feels useless.  Seemed OK prior to virus. Change in Concerta  recently about a week or 2 ago.  Plan: Would prefer something that could have a fairly quick effect and not have to change the Cymbalta  given her  history of doing well on it until recently. She did not respond to Abilify or Rexulti but she might respond to Vraylar  1.5 mg every morning.  It is quick-acting and compatible with her duloxetine  and Concerta .  Disc discussed side effects.  She is not suicidal and does not need hospitalization at this time. Her husband will come pick up samples of Vraylar  1.5 mg 1 every morning starting tomorrow.  Rachael Macintosh, MD, [facility]  04/30/2021 appointment with the following noted: Several phone calls since she was here. Took Vryalar for a week and saw benefit witout SE and then stopped it. Felt fine for awhile.  Then had hard time last weekend and took another Vraylar  bc felt weepy. Today feels fine with mood..  Generally low anxiety. Rare alprazolam . Taking generic Concerta  without a problem. No SE. Not much dep in last 6 mos except as noted. GD pregnant with first [name].  Concerned about GD physical's and mental health. GD married.   GD sober. Plan: continue Concerta  72, duloxetine  120  05/21/21 took Vraylar  again for depression and felt better and rX sent.  Cost was a  problem so PT assistance forms mailed to her.  07/12/21 TC complaining of sleepiness and relating it to Vraylar .  Dose reduced to QOD  08/01/21 No show  10/03/21 TC complaining of depression. Sent Rx mirtazapine  30 mg HS.  Still taking Vraylar  1.5 QOD.  Was told to stop it since it was apparently no lonnger helping  12/10/21 appt noted:  seen with Rachael Callahan A lot of phone calls since here with depressive sx. Current psych meds: duloxetine  90, lithium  300, Ritalin  72 AM, mirtazapine  30. Depression worse in the last couple of week. SE lithium  tremor and jittery.  Therefore needing alprazolam  more often. Last free of dep about 6 weeks ago. Sleeping too much.  Sleep to escape depression.   Asks about Zoloft.   Plan: Plan: Stop mirtazapine  and lithium  Reduce duloxetine  to 60 mg daily for 1 week then 30 mg daily for 1 week then stop  duloxetine  Start Auvelity  1 in the AM for 1 week then 1 twice daily as long as dizziness is not a problem.  02/24/22 appt noted: Several phone calls since she was here.  Jittery on Auvelity  and stopped it. Referral sent for TMS. Then restarted Auvelity  and wanted a prescription February 07, 2022 Taking Auvelity  twice daily since here.   Meds: Auvelity  BID (AM & HS), Concerta  72 AM, pramipexole  1 mg HS.  No SE Didn't notice anything profound coming off other meds but does feel better generally.  Doesn't feel worse.  Stressful family sensation. Feels a little odd sometimes like I'm not totally engaged and don't have energy she wisshes long term.  Depression is better but not gone. Sleep is good.  Appetite good. No sig RLS with pramipexole . Planning to move to where D lives  in [facility] and build house. Enjoys theater and music. Plan: continue Auvelity  BID, Concerta  36 AM, pramipexole  1 mg HS Still being evaluated for TMS  04/29/22 appt noted: Several phone calls since here with various problems. Weaning TMS  and maybe somewhat helpful. Still so low energy.  Days of feeling sad withotu reason.  No recent crying spells-TMS helped.  Better motivation later in the day.  Not much interest or enjoyment.   No change off duloxetine  and on Auvelity . No SE now.   Plan: Plan: DT NR reduce Concerta  36 AM,  Off label for TRD increase pramipexole  to 0.5 mg AM and 1 mg HS Tapering TMS DC  AUVELITY  Start TCA nortriptyline  25 up to 75 mg HS  05/08/22 TC no benefit pramipexole  Plan:  Increase pramipexole  to 1 mg BID if tolerated if she has SE then split the AM dose to 1/2 in the AM and noon and 1 at night.     06/12/22 appt noted: Meds; concerta  36 AM, nortriptyline  75 HS, pramipexole  1 mg BID. Finished TMS end of April Seemed to level out with meds.  Not severe downs or crying in the last couple of weeks.  Tire easily.  Motivation so so.  A little better interest and enjoyment.  Not required to do  much. Dep 4/10.   No SE noted except dry mouth.  No N or dizziness.   Needs to lose wt.   No RLS.   Sleep ok.   Plan: continue Concerta  36 AM,  Off label for TRD increase pramipexole  to 1 mg TID  nortriptyline  75 mg HS  07/23/22 appt noted: Finished TMS end of April 2024 Meds as above. No SE except dry mouth.  No sleepiness or N or  revved up. No benefit with increase pramipexole .  Wants to feel better.  [place]hed[place].  Didn't enjoy beach trip and should have.  Occ crying spells.    Good relationship with D and son in law.  Moving to live with them in [facility].  Looking forward to it. No RLS. Pending knee surgery 08/26/22 Sleep ok.   Plan: Off label for TRD increase pramipexole  to 1 mg QID DT NR or SE If NR in 2 weeks then call and will increase to max 5 mg daily. Check nortriptyline  level .    07/28/22  Amitriptyline  Not Estab. ng/mL <20    Nortriptyline  Not Estab. ng/mL 89  Amitriptyline  & Nortriptyline , Total 80 - 200 ng/mL   Total <109 Plan increase to 100 mg HS from 75 mg HS  09/23/22 appt noted; Psych med; Xanax  0.25 mg prn, Concerta  54 AM, nortrip 100 HS, pramipexole  1 mg QID,  SE dryness Had TKR on R 08/26/22.  No opiates. Feeling better but not sure why.  Pretty quickly after here.  Overall mood is better.  Dep 1-2/10 is clearly better.  Can enjoy some things esp great grand babies.   GD Ariel has baby Iris.  Doing well.   Asks about incr prn Xanax .  Needs some to help with sleep bc awakening for an hour.  Some related to knee discomfort. Rachael Callahan just retired.  Plan to sell house and move to [facility] where D located.  Plan: Plan: continue Concerta  36 AM,  nortriptyline  100 mg HS Off label for TRD increase pramipexole  to 1 mg QID helpful  11/27/22 appt noted: Psych med; Xanax  0.25 mg prn, Concerta  54 AM, nortrip 100 HS, pramipexole  1 mg QID (missing some),  SE dryness Still dealing with issues with TKR.  Doesn't think she is doing well enough.  Will have to go  back to PT.  It is limiting activity and movement.  Gained 12 # since surgery.   Mood so so , not great.   Working to put house on [employer].  Preparing for this has been difficult.   H has good attitude.   Asked about therapy Plan: med changes today; switch to ER to improve compliance pramipexole  4.5 mg daily, Concerta  54 AM, nortrip 100 HS,    Xanax  0.25 mg HS.  02/04/23 appt noted: Psych meds as above.  Consistent with meds.   SE dryness.   Mood pretty good overall.   Moved to [facility] and will purchase mobile home there near D.  Staying with D and son in law.  Moving is stressful.  Will be 6-8 mos until move complete DT construction time.   Rachael Callahan is supportive and stable. She thinks pramipexole  is helping.  Sleep can be mixed up with napping affecting night time sleep.  Getting enough sleep.  06/04/23 appt noted: Med: No med changes today; switched prampexole to ER with better compliance pramipexole  4.5 mg daily,  Concerta  54 AM, nortrip 100 HS,   Xanax  0.25 mg HS.   Really upset about being late and being told she couldn't be seen today.  Then pt after her cancelled and left.  And she could therefore be seen.  Drove from [facility].  Had trouble with GPS trying to get here.   Consistent. Fair .  A lot going on.  House being finished.  A lot to deal with. Can be productivity.   No SE Since knee surgery July gained 40#.  Seeing GP.  Started Ozempic.  No benefit.  Comfort food eating.  Mobility so so.  Doesn't feel like surgery helped.   No impulsivity , compulsivity. Can't think of any other concerns.   Living in D's house and it's OK.   Plan: no med changes.  Refer to therapy.  09/11/23 TC:  Pt reporting having trouble sleeping. Can usually get to sleep, but doesn't stay asleep. Estimates getting 5 hours. Lots of situational things. Rates depression 5-6/10, anxiety 4-5/10, but it fluctuates. Reports having a consultation to see about another sleep study with Dr. [place]esto. H reports she  snores. Abnormal TSH and lipids.   Reports can sleep with alprazolam , but takes more than prescribed in order to do that. Reports took 8 ZzzQuill one night last week because she needed to sleep and she slept, but when she woke up she was searching her house for family members who were no longer there. When asked about caffeine use she said she drank a lot of VIA.  I was not able to find this online to see what the caffeine content was. She was not home, but said it was in a plastic bottle, not the [employer] product.  MD resp:  Avoid caffeine after noon.  Ok to increase alprazolam  to 2-3 of the 0.25 mg tablets at bedtime.  If it doesn't work drop dose back down and call us .    Rachael Macintosh, MD, DFAPA  09/17/23 TC:  Pt reported still not sleeping thru the night with alprazolam  0.25 2-3 tablets. Reports she is up and down. Reports moved 6 months ago and said she doesn't feel like she has slept the entire night since the move, still unpacking. She said she felt like a new mother - sleep deprived.  Pended Rx earlier for alprazolam . Previously tried mirtazapine  but reported SE.     MD resP ;  Stop alprazolam  and start clonazepam  1 or 1 and 1/2 tablets at night.  Will take 15 min longer to kick in than does alprazolam .  Should keep her asleep longer.     Rachael Macintosh, MD, DFAPA       09/18/23 TC:  Pt took clonazepam  last night, took one tablet and took another 1/2 when she woke up at 2 AM. [place] she is still not sleeping, unable to function because she is so tired, is worsening her depression and she is angry and tearful. Reports that no one seems to understand that medications affect her differently. She reports still taking methylphenidate  54 mg in AM. She had reported earlier this week drinking a VIA drink, which does have some caffeine. Reported today she isn't drinking them that much. She is sleeping, but only about 5 hours today a day and she needs more. Denied daytime napping.     MD resp:  Clonazepam  and alprazolam  cannot be stopped abruptly. 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 If this works we will discuss weaning clonazepam  when I see her. Remind her of her upcoming appt and be sure to keep it. Rachael Macintosh, MD, [facility]  09/29/23 TC:  Called pt to see how she slept with the addition of Seroquel  25 mg. She reported she didn't get any more sleep. Reports getting 2 hours of sleep. She did report after having breakfast she took a 2 hour nap.  She has a consultation for a sleep study on 9/2. She reported last week being irritable before she wasn't sleeping. She verbalized the same today, but her affect was  brighter today than it was last week.     MD resp:  If she tolerated it ok then increase to 2-3 of the 25 mg Seroquel  tablets.  Rachael Macintosh, MD, DFAPA  09/29/23 appt noted:  Med: clonazepam  1 mg and quetiapine  50 mg HS, Concerta  54 AM, nortriptyline  100 mg , Pramipexole  ER 4.5 mg daily. No SE.  Consistent. Last night slept in 2 hour spurts with awakening in between.  Got 6 hours. After  More trouble with sleep for no reason but correlates with move to [facility]. Sleep study consultation tomorrow.  No consistent RLS .  Not weekly. Still dealing with depression.   A lot of changes in our lives.  I get aggivated I don't feel better than I do. If were not a [place], I might have taken my own life.  Last not dep for over 2 weeks a few months ago.  No sense of purpose.   Get tired of taking meds.   Dep 8/10.  Just finished house and moved in in May.   Plan: Increase nortriptyline  150 mg HS for tRD.    10/23/23 appt noted: Med; Increase nortriptyline  150 mg HS 3 weeks,  clonazepam  1 mg and quetiapine  50 mg HS, Concerta  54 AM,  reduced Pramipexole  ER 2.25 mg daily. 3 different times of having dreams and awakening thinking maybe it was real.  Awoke confused but brief.   Mild improvements, not as depressed as I did.  3/10.  A little better conc.   Still low energy.  Has some motivation to improve function.  No other SE.   Balance not great; worse in last couple of months.  No falls. Sleep still not as good as I like DT EMA but a little less than it did.  Can usually go back to sleep.  Skipped clonazepam  last couple of days.  Has occ napped in the afternoon. Plan: Increased nortriptyline  150 mg HS for tRD.   DT relapse of depression start weaning pramipexole  to ER every 2 weeks.  11/20/23 appt noted  Med:  nortriptyline  150 HS x 7 wks, no pramipexole , Concerta  54 mg AM, quetiapine  75 mg HS, alprazolam  prn ins or anxiety, no clonazepam  No problems off pramipexole .  Ok but something going on with my GD, Ariel.   She's asking for money.  Having trouble dealing with that.  Wondering if she should resume therapy to help me knowing how to deal with it.  Also D in law causing some problems too. Saw Deane Sprang.  Not sure if any effect from nortriptyline  150.   Function is ok at home.  Pretty normal.   would feel a lot better were it not for these things.   H supportive. Worry over family.   Some interests to some degree.   One of few interests is playing with gkids.  No hobbies.  Will enjoy and watch TV programs and enjoy.  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  Causes fear of driving bc can't feel her feet.   No death thoughts nor SI, I would never do that bc family and faith.  Dep 5/10.  I dont' like feeling this way  Psychiatric medications tried include  fluoxetine,  venlafaxine,  duloxetine  120 + Concerta  54, bupropion,  Trintellix, lithium ,  Mirtazapine  30 without help for depression Auvelity  NR Nortriptyline  150  pramipexole  1 QID  NR  Latuda 40 mg,  Rexulti 2 mg with no benefit,  Vraylar  1.5 had benefit and then lost it.  TMS helped, 2019  ; repeat 04/2022  F hosp for dep.   Sister also with dep.  Review of Systems:  Review of Systems  Cardiovascular:  Negative for chest  pain and palpitations.  Musculoskeletal:  Positive for arthralgias.  Neurological:  Negative for dizziness, tremors and weakness.       RLS off and on  Psychiatric/Behavioral:  Positive for dysphoric mood and sleep disturbance. Negative for agitation, behavioral problems, confusion, decreased concentration, hallucinations, self-injury and suicidal ideas. The patient is not nervous/anxious and is not hyperactive.     Medications: I have reviewed the patient's current medications.  Current Outpatient Medications  Medication Sig Dispense Refill   ALPRAZolam  (XANAX ) 0.25 MG tablet TAKE 1 TABLETS EVERYDAY AS NEEDED ANXIETY AND 1 AT NIGHT IF NEEDED FOR SLEEP 40 tablet 0   Cholecalciferol 125 MCG (5000 UT) TABS Take 5,000 Units by mouth daily.     lithium  carbonate 150 MG capsule Take 1 capsule (150 mg total) by mouth at bedtime. 90 capsule 0   methocarbamol  (ROBAXIN ) 500 MG tablet Take 1 tablet (500 mg total) by mouth every 6 (six) hours as needed for muscle spasms (muscle pain). 40 tablet 2   methylphenidate  54 MG PO CR tablet Take 1 tablet (54 mg total) by mouth daily. 30 tablet 0   metoprolol  succinate (TOPROL -XL) 50 MG 24 hr tablet Take 50 mg by mouth daily.     Multiple Vitamins-Minerals (MULTIVITAMIN ADULT PO) Take 1 tablet by mouth daily.     nortriptyline  (PAMELOR ) 50 MG capsule TAKE 3 CAPSULES (150 MG TOTAL) BY MOUTH AT BEDTIME. 270 capsule 0   olmesartan (BENICAR) 40 MG tablet Take 40 mg by mouth daily.     omeprazole (PRILOSEC) 40 MG capsule Take 40 mg by mouth daily.  1   OZEMPIC, 0.25 OR 0.5 MG/DOSE, 2 MG/3ML SOPN Inject 0.5 mg into the skin once a week.     polyethylene glycol (MIRALAX  / GLYCOLAX ) 17 g packet Take 17 g by mouth 2 (two) times daily. 14 each 0   pravastatin  (PRAVACHOL ) 20 MG tablet Take 20 mg by mouth at bedtime.  3   Probiotic Product (PROBIOTIC-10 PO) Take 1 tablet by mouth daily.     QUEtiapine  (SEROQUEL ) 25 MG tablet Take 2-3 tablets (50-75 mg total) by mouth at  bedtime. (Patient taking differently: Take 75 mg by mouth at bedtime.) 90 tablet 1   methylphenidate  (CONCERTA ) 54 MG PO CR tablet Take 1 tablet (54 mg total) by mouth every morning. 30 tablet 0   [START ON 12/18/2023] methylphenidate  (CONCERTA ) 54 MG PO CR tablet Take 1 tablet (54 mg total) by mouth every morning. 30 tablet 0   No current facility-administered medications for this visit.    Medication Side Effects: None  Allergies: No Known Allergies  Past Medical History:  Diagnosis Date   Arthritis    Depression    High cholesterol    Hypertension    Migraine    Stomach ulcer     History reviewed. No pertinent family history.  Social History   Socioeconomic History   Marital status: Married    Spouse name: Not on file   Number of children: Not on file   Years of education: Not on file   Highest education level: Not on file  Occupational History   Not on file  Tobacco Use   Smoking status: Never   Smokeless tobacco: Never  Vaping Use  Vaping status: Never Used  Substance and Sexual Activity   Alcohol use: Yes    Comment: rarely   Drug use: Never   Sexual activity: Not on file  Other Topics Concern   Not on file  Social History Narrative   Not on file   Social Drivers of Health   Financial Resource Strain: Not on file  Food Insecurity: No Food Insecurity (08/26/2022)   Hunger Vital Sign    Worried About Running Out of Food in the Last Year: Never true    Ran Out of Food in the Last Year: Never true  Transportation Needs: No Transportation Needs (08/26/2022)   PRAPARE - Administrator, Civil Service (Medical): No    Lack of Transportation (Non-Medical): No  Physical Activity: Not on file  Stress: Not on file  Social Connections: Not on file  Intimate Partner Violence: Not At Risk (08/26/2022)   Humiliation, Afraid, Rape, and Kick questionnaire    Fear of Current or Ex-Partner: No    Emotionally Abused: No    Physically Abused: No    Sexually  Abused: No    Past Medical History, Surgical history, Social history, and Family history were reviewed and updated as appropriate.   Please see review of systems for further details on the patient's review from today.   Objective:   Physical Exam:  There were no vitals taken for this visit.  Physical Exam Constitutional:      General: She is not in acute distress.    Appearance: She is well-developed.  Musculoskeletal:        General: No deformity.  Neurological:     Mental Status: She is alert and oriented to person, place, and time.     Motor: No tremor.     Coordination: Coordination normal.     Gait: Gait normal.  Psychiatric:        Attention and Perception: Perception normal. She is attentive.        Mood and Affect: Mood is depressed. Mood is not anxious. Affect is not labile, blunt, angry or inappropriate.        Speech: Speech normal.        Behavior: Behavior normal. Behavior is not slowed.        Thought Content: Thought content normal. Thought content is not delusional. Thought content does not include homicidal or suicidal ideation. Thought content does not include suicidal plan.        Cognition and Memory: Cognition normal.        Judgment: Judgment normal.     Comments: Insight intact. No auditory or visual hallucinations. No delusions.  Talkative without pressure Dep 5/10. No death thoughts.  No SI      Lab Review:     Component Value Date/Time   NA 136 08/27/2022 0410   K 4.5 08/27/2022 0410   CL 103 08/27/2022 0410   CO2 25 08/27/2022 0410   GLUCOSE 182 (H) 08/27/2022 0410   BUN 13 08/27/2022 0410   CREATININE 0.74 08/27/2022 0410   CALCIUM 8.7 (L) 08/27/2022 0410   GFRNONAA >60 08/27/2022 0410   GFRAA >60 05/14/2017 0917       Component Value Date/Time   WBC 9.9 08/27/2022 0410   RBC 3.78 (L) 08/27/2022 0410   HGB 11.7 (L) 08/27/2022 0410   HCT 35.9 (L) 08/27/2022 0410   PLT 205 08/27/2022 0410   MCV 95.0 08/27/2022 0410   MCH 31.0  08/27/2022 0410   MCHC 32.6 08/27/2022  0410   RDW 13.0 08/27/2022 0410   LYMPHSABS 1.4 05/14/2017 0917   MONOABS 0.5 05/14/2017 0917   EOSABS 0.2 05/14/2017 0917   BASOSABS 0.0 05/14/2017 0917    07/28/22  on 75 mg daily. Amitriptyline  Not Estab. ng/mL <20    Nortriptyline  Not Estab. ng/mL 89  Amitriptyline  & Nortriptyline , Total 80 - 200 ng/mL   Total <109  09/15/23 TSH normal.   07/2023 B12 318   Assessment: Plan:    Rachael Callahan was seen today for follow-up, depression, anxiety and sleeping problem.  Diagnoses and all orders for this visit:  Recurrent major depression resistant to treatment -     methylphenidate  (CONCERTA ) 54 MG PO CR tablet; Take 1 tablet (54 mg total) by mouth every morning. -     methylphenidate  (CONCERTA ) 54 MG PO CR tablet; Take 1 tablet (54 mg total) by mouth every morning. -     lithium  carbonate 150 MG capsule; Take 1 capsule (150 mg total) by mouth at bedtime.  Restless legs syndrome  Insomnia due to mental condition  Attention deficit hyperactivity disorder (ADHD), predominantly inattentive type    30 min phone time with patient .  severe depression in Jan 2023 getting better then worse.  But has had a number of bouts of depression in 2023 and since.  Overall seems to have high tolerance of meds.   Was better with pramipexole  1 mg QID but relapsed so weaning.  Hx benefit nortriptyline  with low blood level but relapsed.  So increased to 150 mg HS  Discussed potential benefits, risks, and side effects of stimulants with patient to include increased heart rate, palpitations, insomnia, increased anxiety, increased irritability, or decreased appetite.  Instructed patient to contact office if experiencing any significant tolerability issues.  Extensive discussion of tx options including repeating TMS, Spravato, TCA.  , MAOI,  lamotrigine.   Consider low dose lithium  with nortriptyline  consider selegiline, higher dose quetiapine ,   Disc SE each  type of med including manic type sx.  Extensive disc of alternative Spravato.  07/28/22  On  100 mg HS. Amitriptyline  Not Estab. ng/mL <20    Nortriptyline  Not Estab. ng/mL 89  Amitriptyline  & Nortriptyline , Total 80 - 200 ng/mL   Total <109  nortriptyline  150 mg HS for tRD.   Add lithium  150 mg HS  (hx tremor on 300 but was on high stimulant at the time.)  Concerta  54 AM,   DT TR insomnia  continue quetiapine  75 HS .    Using alprazolam  0.25 mg prn anxiety or insomnia.  Rec therapy but it's difficult given her location.  She'll resume remotely with Deane Sprang.  FU 4 weeks  Rachael Macintosh, MD, DFAPA   Future Appointments  Date Time Provider Department Center  01/19/2024  3:30 PM Cottle, Rachael KANDICE Raddle., MD [facility] None        No orders of the defined types were placed in this encounter.     -------------------------------

## 2023-11-25 ENCOUNTER — Ambulatory Visit: Admitting: Professional Counselor

## 2023-11-25 DIAGNOSIS — F4322 Adjustment disorder with anxiety: Secondary | ICD-10-CM

## 2023-11-25 DIAGNOSIS — F331 Major depressive disorder, recurrent, moderate: Secondary | ICD-10-CM

## 2023-11-25 NOTE — Progress Notes (Signed)
      [facility] Counselor/Therapist Progress Note  Patient ID: Rachael Callahan, MRN: [id number],    Date: 11/25/2023  Time Spent: 2:02 PM to 2:45 PM  Treatment Type: Individual Therapy  I connected with this patient by an approved telecommunication method (audio only), with her informed consent, and verifying identity and patient privacy.  I was located at my office and patient at her home.  As needed, we discussed the limitations, risks, and security and privacy concerns associated with telehealth service, including the availability and conditions which currently govern in-person appointments and the possibility that 3rd-party payment may not be fully guaranteed and she may be responsible for charges.  After she indicated understanding, we proceeded with the session.  Also discussed treatment planning, as needed, including ongoing verbal agreement with the plan, the opportunity to ask and answer all questions, her demonstrated understanding of instructions, and her readiness to call the office should symptoms worsen or she feels she is in a crisis state and needs more immediate and tangible assistance.   Reported Symptoms: worries, stress, anxiousness, low mood, health concerns, family concerns, phase of life concerns, tearfulness, anhedonia  Mental Status Exam:  Appearance:   NA     Behavior:  Appropriate, Sharing, and Motivated  Motor:  N/a  Speech/Language:   Clear and Coherent and Normal Rate  Affect:  NA  Mood:  worried  Thought process:  normal  Thought content:    WNL  Sensory/Perceptual disturbances:    WNL  Orientation:  oriented to person, place, time/date, and situation  Attention:  Good  Concentration:  Good  Memory:  WNL  Fund of knowledge:   Good  Insight:    Good  Judgment:   Good  Impulse Control:  Good   Risk Assessment: Danger to Self:  No Self-injurious Behavior: No Danger to Others: No Duty to Warn:no Physical Aggression / Violence:No  Access to Firearms a  concern: No  Gang Involvement:No   Subjective: Patient presented to session to address concerns of anxiety and depression.  Patient identified minimal progress at this time.  She processed experience of having moved, and stressors around family life and health concerns.  She identified her neuropathy as greatly bothersome to her, and sleeplessness; she identified participating in sleep study pending.  She processed experience of family concerns including as relates addiction concerns.  Counselor actively listened, affirmed patient feelings and experience, and offered psychoeducation, and helped patient resource options such as recovery programs, interventions, Al-Anon meetings, and other support outlets.  Patient processed experience of her faith and spirituality, and explored thoughts and feelings around wondering what God's plan is, and wishing to understand it.  Counselor helped to facilitate insight and patient to resource patient faith/spirituality.  Interventions: Solution-Oriented/Positive Psychology, Humanistic/Existential, Psycho-education/Bibliotherapy, and Insight-Oriented, Resourcing, Spiritually-Integrated Psychotherapy  Diagnosis:   ICD-10-CM   1. Adjustment disorder with anxiety  F43.22     2. Major depressive disorder, recurrent episode, moderate (HCC)  F33.1       Plan: Patient is scheduled for follow-up; continue process work and developing coping skills.  Patient short-term goal between sessions to consider options to support family needs at this time, consider support for herself around these concerns, continue with regular medical care for health concerns, continue to resource spirituality.  Almarie ONEIDA Sprang, [facility]

## 2023-12-01 ENCOUNTER — Telehealth: Payer: Self-pay | Admitting: Psychiatry

## 2023-12-01 NOTE — Telephone Encounter (Signed)
 Pt called and said she had several questions about her medications. She was recently started on lithium  150 mg. She said she knew she was on it previously (at 300 mg which caused tremor) but didn't know why it was stopped. It was listed as ineffective. She reports being on so many different medications and she just wants to feel better, but would like to eliminate some medications. She asked about trying Paxil. I did not see that she had tried it before. She said a friend of hers with similar sx takes it and it is beneficial for her.   She is desperate to feel better and every time she sees a commercial on TV she will call and ask about it. Today she mentioned Trintellix, Auvelity , and Vraylar ; all of which she has tried previously.   She had her home sleep study and said she was dx with mild sleep apnea - 8x/hr. She is waiting for the equipment. She still reports difficulty sleeping and feels like her depression could be better if she was sleeping well. She also mentions that she doesn't think the Xanax  is strong enough.

## 2023-12-01 NOTE — Telephone Encounter (Signed)
Put her on cancellation list. °

## 2023-12-01 NOTE — Telephone Encounter (Signed)
 There is no note she is having a problem with lithium  150.  She has not taken it long enough to help.  Give it another week and if no effect, then stop it and call us .  It seems she is feeling more desperate to get better soon.  The best option to get better quickly is ECT.  I would recommend we refer her to ECT team at [facility].  It provides relief in a couple of weeks.  If she agrees then let us  know and we will refer her.  I don't want to change other meds until finishing the lithium  trial.  Lorene Macintosh, MD, DFAPA

## 2023-12-01 NOTE — Telephone Encounter (Signed)
 Pt lvm stating she has several questions about meds. RTC [phone]

## 2023-12-02 ENCOUNTER — Telehealth: Payer: Self-pay | Admitting: Psychiatry

## 2023-12-02 ENCOUNTER — Telehealth: Payer: Self-pay | Admitting: Emergency Medicine

## 2023-12-02 NOTE — Telephone Encounter (Signed)
 Spoke with patient today @ 2pm. She will continue lithium  150 mg daily until the FU appointment scheduled for 11/21 at 2:00 pm. She is open to ECT at [facility] and has been provided the address ([address] [place], [place] [address]). She will like to discuss referral at FU appointment.   Also, discussed availability of Spravato nearby; none found within a close radius of her home.   She understands that no changes will be made to other medications until completion of the lithium  trial.  No further complaints at this time.

## 2023-12-02 NOTE — Telephone Encounter (Signed)
 Orva lvm that she is still very depressed and she is having a very hard time. She is at a loss of what to do. Please call her at [phone]

## 2023-12-03 ENCOUNTER — Telehealth: Payer: Self-pay | Admitting: Emergency Medicine

## 2023-12-03 ENCOUNTER — Telehealth: Payer: Self-pay | Admitting: Psychiatry

## 2023-12-03 NOTE — Telephone Encounter (Signed)
 Pt husband called saying she is having some heavy depression and he thinks she may need to go to the hospital. He wanted to know if Dr. Geoffry would call and evaluate her over the phone. I told him I would put msg in to the nurse and she would call back. Ok to call either her number or his.

## 2023-12-03 NOTE — Telephone Encounter (Signed)
 Spoke with patient today at 3:45 pm.   She was tearful and reported fluctuating depressive symptoms, stating, sometimes I feel ok, but I feel very depressed right now, I might wake up feeling better tomorrow.?  She will continue lithium  150 mg daily until the FU  appointment scheduled for 11/21 at 2:00 pm. She requested moving forward with  ECT referral at [facility]. I have notified Landry and Andree regarding referral.   Denies SI, HI, or self-harm behavior. Reports no need for hospitalization.  She understands that no changes will be made to other medications until completion of the lithium  trial. No further complaints at this time.  Wilder Kurowski PA-C

## 2023-12-03 NOTE — Telephone Encounter (Signed)
 Priya said patient has a FU appt 11/21 at 2:00 pm. She is not on the schedule. Will you please look into this.

## 2023-12-03 NOTE — Telephone Encounter (Signed)
 I know Priya spoke with patient earlier this week, as did I. It is noted that no medication changes would be made until a lithium  trial. Husband called today and reports pt is in a deep depression and asking us  to evaluate her to see if she needs to go to the hospital. Husband said she is not suicidal, said she wants to take her sleeping meds, go to sleep, and wake up better tomorrow. Asked if he had control of her meds and he does not, asked him if he could secure them. She does not have access to guns. Asked if he was aware of emergency resources if needed and I reviewed them with him. He reports that was why he was calling here.

## 2023-12-04 NOTE — Telephone Encounter (Signed)
 She discussed Spravato with Rachael Callahan, but she can't remember why the discounted it as something she could try.  She wants to know if she can try it and where it is done.

## 2023-12-04 NOTE — Telephone Encounter (Signed)
 Pt called again and is still struggling. She asks why Spravato was discounted. Per Priya's note it was due to no close provider. She would go to [place]. Referral is being made to [facility] for ECT, which is also in [place]. She reports seeing another commercial for Spravato. Very briefly reviewed process with her for Spravato. Told her that there was process to go thru to get approval. She has FU 11/21 and told her that you could discuss the options with her in detail. She wants to know which of the two you would recommend that would afford her the best chance of success.

## 2023-12-04 NOTE — Telephone Encounter (Signed)
 Pt LVM @ 3:43p stating she discussed Spravato

## 2023-12-07 ENCOUNTER — Telehealth: Payer: Self-pay | Admitting: Psychiatry

## 2023-12-07 NOTE — Telephone Encounter (Signed)
 Pt LVM on 11/8 @ 11:22a asking about refill of Methylphenidate , which I advised has been sent.  She called back back asking if Dawna had done the referral for ECT yet.  She said she is feeling a little better and she wonders if the lithium  is helping.  Next appt 11/21

## 2023-12-07 NOTE — Telephone Encounter (Signed)
 Referral for ECT sent to [facility] ECT dept.

## 2023-12-07 NOTE — Telephone Encounter (Signed)
 Rachael Callahan said ECT referral will be sent today.

## 2023-12-08 ENCOUNTER — Telehealth: Payer: Self-pay | Admitting: Psychiatry

## 2023-12-08 NOTE — Telephone Encounter (Signed)
 Pt lvm stating having rough time and can't stop crying. RTC [phone]

## 2023-12-08 NOTE — Telephone Encounter (Signed)
 Pt has an appt with Dr. Geoffry on 11/21. If not done already, please put on cancellation list as well.

## 2023-12-09 NOTE — Telephone Encounter (Signed)
 LVM to [facility]

## 2023-12-10 ENCOUNTER — Telehealth: Payer: Self-pay | Admitting: Psychiatry

## 2023-12-10 NOTE — Telephone Encounter (Signed)
 Patient called. She saw her PCP today and mentioned ketamine infusions to her. She said she contacted CPAP equipment company today and they asked for her insurance information. She said she feels like if she could get some sleep she would be better.   Asked if she had called back Mia Kim. Note says she called patient 11/11 at 1:30. Pt said she doesn't remember getting a message from her. Provided her phone number for her to call her back.

## 2023-12-10 NOTE — Telephone Encounter (Signed)
Pt lvm  rtc

## 2023-12-10 NOTE — Telephone Encounter (Signed)
 Have another note for her and will address issues there.

## 2023-12-10 NOTE — Telephone Encounter (Signed)
 Pt Lvm @ 11:45 stating she went to her PCP today and he asked her if Dr Geoffry had told her about Ketamine infusions.  She wanted to know what he thought about that.  Next appt 11/21

## 2023-12-11 ENCOUNTER — Other Ambulatory Visit: Payer: Self-pay | Admitting: Psychiatry

## 2023-12-11 ENCOUNTER — Telehealth: Payer: Self-pay | Admitting: Psychiatry

## 2023-12-11 DIAGNOSIS — F5105 Insomnia due to other mental disorder: Secondary | ICD-10-CM

## 2023-12-11 NOTE — Telephone Encounter (Signed)
 Please see message from patient. Mia called her 11/11 in regards to ECT.  I asked her on Wednesday or Thursday if she had called Mia back. She said she doesn't remember getting a message to call. I provided her the # to call yesterday and she called back and said the # wasn't working. I called her again and she said she had dialed the # wrong. She said today she left a message for Mia and didn't hear back, got a direct # for her and it just rings and rings.

## 2023-12-11 NOTE — Telephone Encounter (Signed)
 Pt left vm wanting to tell Dawna she did not here from Mia today. She realizes she is on a low dose of lithium  and if it could be increased or if Dr. Geoffry would send in some more Xanax .

## 2023-12-14 NOTE — Telephone Encounter (Signed)
 If we don't hear from from [facility] by Friday, please cal the office of the [facility] of Psychiat[facility] and ask his/her secretary about what is going on.  They will know if there are long delays.  BTW mention, I've been referring to them for 30 years and would like to continue to do it.

## 2023-12-15 ENCOUNTER — Telehealth: Payer: Self-pay | Admitting: Psychiatry

## 2023-12-15 NOTE — Telephone Encounter (Signed)
 Pt called c/o hand tremors. She is on 150 mg of lithium . Reports after taking for a week the tremors started. She said she knew she had been on it previously and wondered why it was stopped. I see in medication history that 300 mg lithium  was ineffective. She saw a commercial for Rexulti and asked about that. I told her that she had tried 2 mg and it was not effective. She picked up her CPAP today. She has ECT consult at [facility] on 11/25 at 1 PM. She did laugh some today.

## 2023-12-15 NOTE — Telephone Encounter (Signed)
 Pt thinks she has developed a tremor from the lithium . She also wanted to let Dr. Geoffry know she got an appt with [facility].

## 2023-12-15 NOTE — Telephone Encounter (Signed)
 Pt lvm at 4:22p.  She said she knows she just spoke to [place] but she wants to know if she should continue the Lithium .  Pls call her back.

## 2023-12-15 NOTE — Telephone Encounter (Signed)
 Pt called back at 2:59p stating the same message about having hand tremors and whether it's related to the lithium .  Next appt 11/21

## 2023-12-15 NOTE — Telephone Encounter (Signed)
 Pt left message this AM that she has an appt at [facility] for ECT, no mention of a date.

## 2023-12-16 NOTE — Telephone Encounter (Signed)
 TC:  spoke with ER Doc Jarold.  Pt there with palpitations and anxiety .  GD with her. Work up normal.   GD wanting psych adm but they don't have psych beds.  Pt clearly denying SI.  Disc option of referral for inpt at [facility] where we sent ECT referral but probably won't meet criteria.   She has appt here in 2 days. Lorene Macintosh, MD, DFAPA

## 2023-12-17 ENCOUNTER — Other Ambulatory Visit: Payer: Self-pay | Admitting: Psychiatry

## 2023-12-17 ENCOUNTER — Telehealth: Payer: Self-pay | Admitting: Psychiatry

## 2023-12-17 DIAGNOSIS — G2581 Restless legs syndrome: Secondary | ICD-10-CM

## 2023-12-17 NOTE — Telephone Encounter (Signed)
 She has an appt with Dr. Geoffry tomorrow and this can be addressed at that time. Last filled 11/10. It is prescribed PRN.

## 2023-12-17 NOTE — Telephone Encounter (Signed)
Alprazolam

## 2023-12-17 NOTE — Telephone Encounter (Signed)
 Pt Lvm @ 2:30p and 2:32p requesting to get refill today instead of waiting until her visit tomorrow.  She said she is out and she needs them for sleep tonight.

## 2023-12-17 NOTE — Telephone Encounter (Signed)
 Pt lvm @ 11:01a asking if Dr Geoffry will write a script for Alprazolam  for more than 40 pills.  She said she takes one for anxiety in the day and one to go to sleep at night.  [id number] - [place], [place] - [address] [place] [address] Phone: [phone]  Fax: [phone]   Next appt 11/21

## 2023-12-18 ENCOUNTER — Ambulatory Visit: Admitting: Psychiatry

## 2023-12-18 ENCOUNTER — Encounter: Payer: Self-pay | Admitting: Psychiatry

## 2023-12-18 DIAGNOSIS — G2581 Restless legs syndrome: Secondary | ICD-10-CM | POA: Diagnosis not present

## 2023-12-18 DIAGNOSIS — F900 Attention-deficit hyperactivity disorder, predominantly inattentive type: Secondary | ICD-10-CM

## 2023-12-18 DIAGNOSIS — F5105 Insomnia due to other mental disorder: Secondary | ICD-10-CM | POA: Diagnosis not present

## 2023-12-18 DIAGNOSIS — F339 Major depressive disorder, recurrent, unspecified: Secondary | ICD-10-CM | POA: Diagnosis not present

## 2023-12-18 MED ORDER — METHYLPHENIDATE HCL ER (OSM) 36 MG PO TBCR: 36.0000 mg | EXTENDED_RELEASE_TABLET | Freq: Every day | ORAL | 0 refills | Status: DC

## 2023-12-18 MED ORDER — ALPRAZOLAM 0.25 MG PO TABS: 0.2500 mg | ORAL_TABLET | Freq: Four times a day (QID) | ORAL | 0 refills | Status: AC

## 2023-12-18 NOTE — Progress Notes (Signed)
 Rachael Callahan [id number] April 17, 1945 78 y.o.  Virtual Visit via Telephone Note  I connected with pt by telephone and verified that I am speaking with the correct person using two identifiers.   I discussed the limitations, risks, security and privacy concerns of performing an evaluation and management service by telephone and the availability of in person appointments. I also discussed with the patient that there may be a patient responsible charge related to this service. The patient expressed understanding and agreed to proceed.  I discussed the assessment and treatment plan with the patient. The patient was provided an opportunity to ask questions and all were answered. The patient agreed with the plan and demonstrated an understanding of the instructions.   The patient was advised to call back or seek an in-person evaluation if the symptoms worsen or if the condition fails to improve as anticipated.  I provided 30 minutes of non-face-to-face time during this encounter.  The patient was located at home in [facility] and the provider was located office.  Session 200-230 pm Difficult for patient to get here.   Subjective:   Patient ID:  Rachael Callahan is a 78 y.o. (DOB March 10, 1945) female.  Chief Complaint:  Chief Complaint  Patient presents with   Follow-up   Depression   Anxiety   Sleeping Problem    Trinty Callahan presents to the office today for follow-up of TRD.  Prior addition of Concerta  had helped with depression and productivity and interest and alertness.  visit August 2020.  She was doing generally well and no meds were changed.  03/2019 appt with the following noted: BP up here but reports it has been normal at home.   No new problems.  Still good On Concerta  to 54 mg daily and duloxetine  120 mg since Jan 2020.  Has been doing well.  Overall the combination of meds is effective.  More productive and more interested and no longer depressed.  No longer napping.   Busy summer.   D in law brought 2 gkids from [place] and that was great.  GS 8 years older than adopted Rachael Callahan and they do well together. Thinks TMS helped some also. RLS managed usually with daily pramipexole . Xanax  use a couple times per month. Often over concerns with grand-daughter's mother who causes problems. Minimal anxiety .  Sleep good and less napping.  Patient reports stable mood and denies depressed or irritable moods.  Patient denies any recent difficulty with anxiety.  Patient denies difficulty with sleep initiation or maintenance. Night owl and gets 7-8 hours.  No napping.   Denies appetite disturbance.  Patient reports that energy and motivation have been good.  Patient denies any difficulty with concentration.  Patient denies any suicidal ideation. Concerned about Rachael Callahan who's not doing well with psych treatment.  Had seen Rachael Callahan before but transferred.  Rachael Callahan Bipolar disorder dx.  Seen at [facility].  Also had hosp.  Plan:  No meds changed  10/11/19 appt with the following noted: Covid free.  Vaccinated. Pretty good mood. Satisfied with meds otherwise. Last week problems with RLS after being stable for awhile.  No more caffeine than usual. No SE. Patient reports stable mood and denies depressed or irritable moods.  Patient denies any recent difficulty with anxiety.  Patient denies difficulty with sleep initiation or maintenance. Denies appetite disturbance.  Patient reports that energy and motivation have been good.  Patient denies any difficulty with concentration.  Patient denies any suicidal ideation. Uses Xanax  occ. She and  Rachael Callahan well. Rachael Callahan 27 and needs for insurance to see doc in [name]. Plan: no changes  04/11/2020 appointment with the following noted: Since here concerta  increased to 72 mg AM.  I think it's what I needed.  More motivated and energetic and productive but not normal. Still on duloxetine  120 without SE.   Mood good.  Patient reports stable mood and denies depressed or  irritable moods.  Patient denies any recent difficulty with anxiety.  Patient denies difficulty with sleep initiation or maintenance, but night owl so sleep is messed up with retirement. Denies appetite disturbance.  Patient reports that energy and motivation have been good.  Patient denies any difficulty with concentration.  Patient denies any suicidal ideation. RLS managed.   Plan :  No med changes and she agrees.  10/11/2020 appointment with the following noted: Xanax  3-4 times per month. RLS managed with pramipexole  and muscle relaxing cream. No sig depression.  Satisfied with meds.  Occ situational mood problems. Patient reports stable mood and denies depressed or irritable moods.  Patient denies any recent difficulty with anxiety.  Patient denies difficulty with sleep initiation or maintenance. Denies appetite disturbance.  Patient reports that energy and motivation have been good.  Patient denies any difficulty with concentration.  Patient denies any suicidal ideation. Stress Rachael Callahan married last year to cocaine addict. No SE Initial insomnia at night but can sleep better in the day. 1 cup AM coffee. Plan: No med changes  02/26/2021 phone call from husband and also discussed with patient.  MD note as follows: RTC  [phone] VEAR Rachael Callahan She's been in bed for a couple of days and doesn't feel life is worth living.  Not directly voiced SI.  Only like this for a couple of days.  She doesn't desire to go anywhere.  He knows of no trigger except ongoing family issues.  Disc with pt Felt bad for a couple of mos after virus in December.  Not Covid.  Cough ongoing.  Don't feel like doing anything.  No energy.  No interest.  Everything seems like too much to deal with.  No med changes. No SI but feels useless.  Seemed OK prior to virus. Change in Concerta  recently about a week or 2 ago.  Plan: Would prefer something that could have a fairly quick effect and not have to change the Cymbalta  given her  history of doing well on it until recently. She did not respond to Abilify or Rexulti but she might respond to Vraylar  1.5 mg every morning.  It is quick-acting and compatible with her duloxetine  and Concerta .  Disc discussed side effects.  She is not suicidal and does not need hospitalization at this time. Her husband will come pick up samples of Vraylar  1.5 mg 1 every morning starting tomorrow.  Lorene Macintosh, MD, [facility]  04/30/2021 appointment with the following noted: Several phone calls since she was here. Took Vryalar for a week and saw benefit witout SE and then stopped it. Felt fine for awhile.  Then had hard time last weekend and took another Vraylar  bc felt weepy. Today feels fine with mood..  Generally low anxiety. Rare alprazolam . Taking generic Concerta  without a problem. No SE. Not much dep in last 6 mos except as noted. Rachael Callahan pregnant with first [name].  Concerned about Rachael Callahan physical's and mental health. Rachael Callahan married.   Rachael Callahan sober. Plan: continue Concerta  72, duloxetine  120  05/21/21 took Vraylar  again for depression and felt better and rX sent.  Cost was  a problem so PT assistance forms mailed to her.  07/12/21 TC complaining of sleepiness and relating it to Vraylar .  Dose reduced to QOD  08/01/21 No show  10/03/21 TC complaining of depression. Sent Rx mirtazapine  30 mg HS.  Still taking Vraylar  1.5 QOD.  Was told to stop it since it was apparently no lonnger helping  12/10/21 appt noted:  seen with VEAR Rachael Callahan A lot of phone calls since here with depressive sx. Current psych meds: duloxetine  90, lithium  300, Ritalin  72 AM, mirtazapine  30. Depression worse in the last couple of week. SE lithium  tremor and jittery.  Therefore needing alprazolam  more often. Last free of dep about 6 weeks ago. Sleeping too much.  Sleep to escape depression.   Asks about Zoloft.   Plan: Plan: Stop mirtazapine  and lithium  Reduce duloxetine  to 60 mg daily for 1 week then 30 mg daily for 1 week then stop  duloxetine  Start Auvelity  1 in the AM for 1 week then 1 twice daily as long as dizziness is not a problem.  02/24/22 appt noted: Several phone calls since she was here.  Jittery on Auvelity  and stopped it. Referral sent for TMS. Then restarted Auvelity  and wanted a prescription February 07, 2022 Taking Auvelity  twice daily since here.   Meds: Auvelity  BID (AM & HS), Concerta  72 AM, pramipexole  1 mg HS.  No SE Didn't notice anything profound coming off other meds but does feel better generally.  Doesn't feel worse.  Stressful family sensation. Feels a little odd sometimes like I'm not totally engaged and don't have energy she wisshes long term.  Depression is better but not gone. Sleep is good.  Appetite good. No sig RLS with pramipexole . Planning to move to where D lives  in [facility] and build house. Enjoys theater and music. Plan: continue Auvelity  BID, Concerta  36 AM, pramipexole  1 mg HS Still being evaluated for TMS  04/29/22 appt noted: Several phone calls since here with various problems. Weaning TMS  and maybe somewhat helpful. Still so low energy.  Days of feeling sad withotu reason.  No recent crying spells-TMS helped.  Better motivation later in the day.  Not much interest or enjoyment.   No change off duloxetine  and on Auvelity . No SE now.   Plan: Plan: DT NR reduce Concerta  36 AM,  Off label for TRD increase pramipexole  to 0.5 mg AM and 1 mg HS Tapering TMS DC  AUVELITY  Start TCA nortriptyline  25 up to 75 mg HS  05/08/22 TC no benefit pramipexole  Plan:  Increase pramipexole  to 1 mg BID if tolerated if she has SE then split the AM dose to 1/2 in the AM and noon and 1 at night.     06/12/22 appt noted: Meds; concerta  36 AM, nortriptyline  75 HS, pramipexole  1 mg BID. Finished TMS end of April Seemed to level out with meds.  Not severe downs or crying in the last couple of weeks.  Tire easily.  Motivation so so.  A little better interest and enjoyment.  Not required to do  much. Dep 4/10.   No SE noted except dry mouth.  No N or dizziness.   Needs to lose wt.   No RLS.   Sleep ok.   Plan: continue Concerta  36 AM,  Off label for TRD increase pramipexole  to 1 mg TID  nortriptyline  75 mg HS  07/23/22 appt noted: Finished TMS end of April 2024 Meds as above. No SE except dry mouth.  No sleepiness or N  or revved up. No benefit with increase pramipexole .  Wants to feel better.  Andhedonia.  Didn't enjoy beach trip and should have.  Occ crying spells.    Good relationship with D and son in law.  Moving to live with them in [facility]  Looking forward to it. No RLS. Pending knee surgery 08/26/22 Sleep ok.   Plan: Off label for TRD increase pramipexole  to 1 mg QID DT NR or SE If NR in 2 weeks then call and will increase to max 5 mg daily. Check nortriptyline  level .    07/28/22  Amitriptyline  Not Estab. ng/mL <20    Nortriptyline  Not Estab. ng/mL 89  Amitriptyline  & Nortriptyline , Total 80 - 200 ng/mL   Total <109 Plan increase to 100 mg HS from 75 mg HS  09/23/22 appt noted; Psych med; Xanax  0.25 mg prn, Concerta  54 AM, nortrip 100 HS, pramipexole  1 mg QID,  SE dryness Had TKR on R 08/26/22.  No opiates. Feeling better but not sure why.  Pretty quickly after here.  Overall mood is better.  Dep 1-2/10 is clearly better.  Can enjoy some things esp great grand babies.   Rachael Callahan Ariel has baby Iris.  Doing well.   Asks about incr prn Xanax .  Needs some to help with sleep bc awakening for an hour.  Some related to knee discomfort. VEAR Rachael Callahan just retired.  Plan to sell house and move to [facility] [facility] where D located.  Plan: Plan: continue Concerta  36 AM,  nortriptyline  100 mg HS Off label for TRD increase pramipexole  to 1 mg QID helpful  11/27/22 appt noted: Psych med; Xanax  0.25 mg prn, Concerta  54 AM, nortrip 100 HS, pramipexole  1 mg QID (missing some),  SE dryness Still dealing with issues with TKR.  Doesn't think she is doing well enough.  Will have to go  back to PT.  It is limiting activity and movement.  Gained 12 # since surgery.   Mood so so , not great.   Working to put house on [employer].  Preparing for this has been difficult.   Rachael Callahan has good attitude.   Asked about therapy Plan: med changes today; switch to ER to improve compliance pramipexole  4.5 mg daily, Concerta  54 AM, nortrip 100 HS,    Xanax  0.25 mg HS.  02/04/23 appt noted: Psych meds as above.  Consistent with meds.   SE dryness.   Mood pretty good overall.   Moved to [facility] and will purchase mobile home there near D.  Staying with D and son in law.  Moving is stressful.  Will be 6-8 mos until move complete DT construction time.   VEAR Rachael Callahan is supportive and stable. She thinks pramipexole  is helping.  Sleep can be mixed up with napping affecting night time sleep.  Getting enough sleep.  06/04/23 appt noted: Med: No med changes today; switched prampexole to ER with better compliance pramipexole  4.5 mg daily,  Concerta  54 AM, nortrip 100 HS,   Xanax  0.25 mg HS.   Really upset about being late and being told she couldn't be seen today.  Then pt after her cancelled and left.  And she could therefore be seen.  Drove from [facility]  Had trouble with GPS trying to get here.   Consistent. Fair .  A lot going on.  House being finished.  A lot to deal with. Can be productivity.   No SE Since knee surgery July gained 40#.  Seeing GP.  Started Ozempic.  No benefit.  Comfort food eating.  Mobility so so.  Doesn't feel like surgery helped.   No impulsivity , compulsivity. Can't think of any other concerns.   Living in D's house and it's OK.   Plan: no med changes.  Refer to therapy.  09/11/23 TC:  Pt reporting having trouble sleeping. Can usually get to sleep, but doesn't stay asleep. Estimates getting 5 hours. Lots of situational things. Rates depression 5-6/10, anxiety 4-5/10, but it fluctuates. Reports having a consultation to see about another sleep study with Dr. [place]. Rachael Callahan reports she  snores. Abnormal TSH and lipids.   Reports can sleep with alprazolam , but takes more than prescribed in order to do that. Reports took 8 ZzzQuill one night last week because she needed to sleep and she slept, but when she woke up she was searching her house for family members who were no longer there. When asked about caffeine use she said she drank a lot of VIA.  I was not able to find this online to see what the caffeine content was. She was not home, but said it was in a plastic bottle, not the [employer] product.  MD resp:  Avoid caffeine after noon.  Ok to increase alprazolam  to 2-3 of the 0.25 mg tablets at bedtime.  If it doesn't work drop dose back down and call us .    Lorene Macintosh, MD, DFAPA  09/17/23 TC:  Pt reported still not sleeping thru the night with alprazolam  0.25 2-3 tablets. Reports she is up and down. Reports moved 6 months ago and said she doesn't feel like she has slept the entire night since the move, still unpacking. She said she felt like a new mother - sleep deprived.  Pended Rx earlier for alprazolam . Previously tried mirtazapine  but reported SE.     MD resP ;  Stop alprazolam  and start clonazepam  1 or 1 and 1/2 tablets at night.  Will take 15 min longer to kick in than does alprazolam .  Should keep her asleep longer.     Lorene Macintosh, MD, DFAPA       09/18/23 TC:  Pt took clonazepam  last night, took one tablet and took another 1/2 when she woke up at 2 AM. [place] she is still not sleeping, unable to function because she is so tired, is worsening her depression and she is angry and tearful. Reports that no one seems to understand that medications affect her differently. She reports still taking methylphenidate  54 mg in AM. She had reported earlier this week drinking a VIA drink, which does have some caffeine. Reported today she isn't drinking them that much. She is sleeping, but only about 5 hours today a day and she needs more. Denied daytime napping.     MD resp:  Clonazepam  and alprazolam  cannot be stopped abruptly. 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 If this works we will discuss weaning clonazepam  when I see her. Remind her of her upcoming appt and be sure to keep it. Lorene Macintosh, MD, DFAPA  09/29/23 TC:  Called pt to see how she slept with the addition of Seroquel  25 mg. She reported she didn't get any more sleep. Reports getting 2 hours of sleep. She did report after having breakfast she took a 2 hour nap.  She has a consultation for a sleep study on 9/2. She reported last week being irritable before she wasn't sleeping. She verbalized the same today, but her affect was  brighter today than it was last week.     MD resp:  If she tolerated it ok then increase to 2-3 of the 25 mg Seroquel  tablets.  Lorene Macintosh, MD, DFAPA  09/29/23 appt noted:  Med: clonazepam  1 mg and quetiapine  50 mg HS, Concerta  54 AM, nortriptyline  100 mg , Pramipexole  ER 4.5 mg daily. No SE.  Consistent. Last night slept in 2 hour spurts with awakening in between.  Got 6 hours. After  More trouble with sleep for no reason but correlates with move to [facility]. Sleep study consultation tomorrow.  No consistent RLS .  Not weekly. Still dealing with depression.   A lot of changes in our lives.  I get aggivated I don't feel better than I do. If were not a Christian, I might have taken my own life.  Last not dep for over 2 weeks a few months ago.  No sense of purpose.   Get tired of taking meds.   Dep 8/10.  Just finished house and moved in in May.   Plan: Increase nortriptyline  150 mg HS for tRD.    10/23/23 appt noted: Med; Increase nortriptyline  150 mg HS 3 weeks,  clonazepam  1 mg and quetiapine  50 mg HS, Concerta  54 AM,  reduced Pramipexole  ER 2.25 mg daily. 3 different times of having dreams and awakening thinking maybe it was real.  Awoke confused but brief.   Mild improvements, not as depressed as I did.  3/10.  A little better conc.   Still low energy.  Has some motivation to improve function.  No other SE.   Balance not great; worse in last couple of months.  No falls. Sleep still not as good as I like DT EMA but a little less than it did.  Can usually go back to sleep.  Skipped clonazepam  last couple of days.  Has occ napped in the afternoon. Plan: Increased nortriptyline  150 mg HS for tRD.   DT relapse of depression start weaning pramipexole  to ER every 2 weeks.  11/20/23 appt noted  Med:  nortriptyline  150 HS x 7 wks, no pramipexole , Concerta  54 mg AM, quetiapine  75 mg HS, alprazolam  prn ins or anxiety, no clonazepam  No problems off pramipexole .  Ok but something going on with my Rachael Callahan, Ariel.   She's asking for money.  Having trouble dealing with that.  Wondering if she should resume therapy to help me knowing how to deal with it.  Also D in law causing some problems too. Saw Deane Sprang.  Not sure if any effect from nortriptyline  150.   Function is ok at home.  Pretty normal.   would feel a lot better were it not for these things.   Rachael Callahan supportive. Worry over family.   Some interests to some degree.   One of few interests is playing with gkids.  No hobbies.  Will enjoy and watch TV programs and enjoy.  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  Causes fear of driving bc can't feel her feet.   No death thoughts nor SI, I would never do that bc family and faith.  Dep 5/10.  I dont' like feeling this way Plan: have referred to ECT  12/18/23 appt noted:  nortriptyline  150 HS , Concerta  36 mg AM, quetiapine  75 mg HS, alprazolam  prn ins or anxiety & taking BID 0.5 mg .  Lithium  150 mg daily. Pt call or family called couple of times since last  appt seeking other tx options; including 2 days ago at ER.  Went to ER with shaking and crying.   The depression is worse than anything. ECT team [facility] called 11/25 at 1 pm and has appt then.   Questions about ECT Needs Xanax  bc very  anxious easily even doing simple things.   Just issued CPAP for 2 nights.    Psychiatric medications tried include  fluoxetine,  venlafaxine,  duloxetine  120 + Concerta  54, bupropion,  Trintellix, lithium ,  Mirtazapine  30 without help for depression Auvelity  NR Nortriptyline  150  pramipexole  1 QID  NR  Latuda 40 mg,  Rexulti 2 mg with no benefit,  Vraylar  1.5 had benefit and then lost it.  TMS helped, 2019  ; repeat 04/2022  F hosp for dep & ECT which worked.    Sister also with dep.   Review of Systems:  Review of Systems  Cardiovascular:  Negative for chest pain and palpitations.  Musculoskeletal:  Positive for arthralgias.  Neurological:  Negative for dizziness, tremors and weakness.       RLS off and on  Psychiatric/Behavioral:  Positive for dysphoric mood and sleep disturbance. Negative for agitation, behavioral problems, confusion, decreased concentration, hallucinations, self-injury and suicidal ideas. The patient is not nervous/anxious and is not hyperactive.     Medications: I have reviewed the patient's current medications.  Current Outpatient Medications  Medication Sig Dispense Refill   Cholecalciferol 125 MCG (5000 UT) TABS Take 5,000 Units by mouth daily.     lithium  carbonate 150 MG capsule Take 1 capsule (150 mg total) by mouth at bedtime. 90 capsule 0   methocarbamol  (ROBAXIN ) 500 MG tablet Take 1 tablet (500 mg total) by mouth every 6 (six) hours as needed for muscle spasms (muscle pain). 40 tablet 2   metoprolol  succinate (TOPROL -XL) 50 MG 24 hr tablet Take 50 mg by mouth daily.     Multiple Vitamins-Minerals (MULTIVITAMIN ADULT PO) Take 1 tablet by mouth daily.     nortriptyline  (PAMELOR ) 50 MG capsule TAKE 3 CAPSULES (150 MG TOTAL) BY MOUTH AT BEDTIME. 270 capsule 0   olmesartan (BENICAR) 40 MG tablet Take 40 mg by mouth daily.     omeprazole (PRILOSEC) 40 MG capsule Take 40 mg by mouth daily.  1   OZEMPIC, 0.25 OR 0.5 MG/DOSE, 2 MG/3ML SOPN Inject 0.5  mg into the skin once a week.     polyethylene glycol (MIRALAX  / GLYCOLAX ) 17 g packet Take 17 g by mouth 2 (two) times daily. 14 each 0   pravastatin  (PRAVACHOL ) 20 MG tablet Take 20 mg by mouth at bedtime.  3   Probiotic Product (PROBIOTIC-10 PO) Take 1 tablet by mouth daily.     QUEtiapine  (SEROQUEL ) 25 MG tablet Take 3 tablets (75 mg total) by mouth at bedtime. 270 tablet 0   ALPRAZolam  (XANAX ) 0.25 MG tablet Take 1 tablet (0.25 mg total) by mouth in the morning, at noon, in the evening, and at bedtime. 120 tablet 0   methylphenidate  (CONCERTA ) 54 MG PO CR tablet Take 1 tablet (54 mg total) by mouth every morning. (Patient not taking: Reported on 12/18/2023) 30 tablet 0   methylphenidate  (CONCERTA ) 54 MG PO CR tablet Take 1 tablet (54 mg total) by mouth every morning. (Patient not taking: Reported on 12/18/2023) 30 tablet 0   methylphenidate  36 MG PO CR tablet Take 1 tablet (36 mg total) by mouth daily. 30 tablet 0   No current facility-administered medications for this visit.  Medication Side Effects: None  Allergies: No Known Allergies  Past Medical History:  Diagnosis Date   Arthritis    Depression    High cholesterol    Hypertension    Migraine    Stomach ulcer     History reviewed. No pertinent family history.  Social History   Socioeconomic History   Marital status: Married    Spouse name: Not on file   Number of children: Not on file   Years of education: Not on file   Highest education level: Not on file  Occupational History   Not on file  Tobacco Use   Smoking status: Never   Smokeless tobacco: Never  Vaping Use   Vaping status: Never Used  Substance and Sexual Activity   Alcohol use: Yes    Comment: rarely   Drug use: Never   Sexual activity: Not on file  Other Topics Concern   Not on file  Social History Narrative   Not on file   Social Drivers of Health   Financial Resource Strain: Not on file  Food Insecurity: No Food Insecurity (08/26/2022)    Hunger Vital Sign    Worried About Running Out of Food in the Last Year: Never true    Ran Out of Food in the Last Year: Never true  Transportation Needs: No Transportation Needs (08/26/2022)   PRAPARE - Administrator, Civil Service (Medical): No    Lack of Transportation (Non-Medical): No  Physical Activity: Not on file  Stress: Not on file  Social Connections: Not on file  Intimate Partner Violence: Not At Risk (08/26/2022)   Humiliation, Afraid, Rape, and Kick questionnaire    Fear of Current or Ex-Partner: No    Emotionally Abused: No    Physically Abused: No    Sexually Abused: No    Past Medical History, Surgical history, Social history, and Family history were reviewed and updated as appropriate.   Please see review of systems for further details on the patient's review from today.   Objective:   Physical Exam:  There were no vitals taken for this visit.  Physical Exam Constitutional:      General: She is not in acute distress.    Appearance: She is well-developed.  Musculoskeletal:        General: No deformity.  Neurological:     Mental Status: She is alert and oriented to person, place, and time.     Motor: No tremor.     Coordination: Coordination normal.     Gait: Gait normal.  Psychiatric:        Attention and Perception: Perception normal. She is attentive.        Mood and Affect: Mood is anxious and depressed. Affect is not labile, blunt, angry or inappropriate.        Speech: Speech normal.        Behavior: Behavior normal. Behavior is not slowed.        Thought Content: Thought content normal. Thought content is not delusional. Thought content does not include homicidal or suicidal ideation. Thought content does not include suicidal plan.        Cognition and Memory: Cognition normal.        Judgment: Judgment normal.     Comments: Insight intact. No auditory or visual hallucinations. No delusions.  Talkative without pressure Dep 8/10.   More anxious No death thoughts.  No SI      Lab Review:     Component Value  Date/Time   NA 136 08/27/2022 0410   K 4.5 08/27/2022 0410   CL 103 08/27/2022 0410   CO2 25 08/27/2022 0410   GLUCOSE 182 (Rachael Callahan) 08/27/2022 0410   BUN 13 08/27/2022 0410   CREATININE 0.74 08/27/2022 0410   CALCIUM 8.7 (L) 08/27/2022 0410   GFRNONAA >60 08/27/2022 0410   GFRAA >60 05/14/2017 0917       Component Value Date/Time   WBC 9.9 08/27/2022 0410   RBC 3.78 (L) 08/27/2022 0410   HGB 11.7 (L) 08/27/2022 0410   HCT 35.9 (L) 08/27/2022 0410   PLT 205 08/27/2022 0410   MCV 95.0 08/27/2022 0410   MCH 31.0 08/27/2022 0410   MCHC 32.6 08/27/2022 0410   RDW 13.0 08/27/2022 0410   LYMPHSABS 1.4 05/14/2017 0917   MONOABS 0.5 05/14/2017 0917   EOSABS 0.2 05/14/2017 0917   BASOSABS 0.0 05/14/2017 0917    07/28/22  on 75 mg daily. Amitriptyline  Not Estab. ng/mL <20    Nortriptyline  Not Estab. ng/mL 89  Amitriptyline  & Nortriptyline , Total 80 - 200 ng/mL   Total <109  09/15/23 TSH normal.   07/2023 B12 318   Assessment: Plan:    Dessire was seen today for follow-up, depression, anxiety and sleeping problem.  Diagnoses and all orders for this visit:  Recurrent major depression resistant to treatment -     methylphenidate  36 MG PO CR tablet; Take 1 tablet (36 mg total) by mouth daily.  Attention deficit hyperactivity disorder (ADHD), predominantly inattentive type  Insomnia due to mental condition  Restless legs syndrome -     ALPRAZolam  (XANAX ) 0.25 MG tablet; Take 1 tablet (0.25 mg total) by mouth in the morning, at noon, in the evening, and at bedtime.     30 min phone time with patient .  severe depression in Jan 2023 getting better then worse.  But has had a number of bouts of depression in 2023 and since.  Overall seems to have high tolerance of meds.   Was better with pramipexole  1 mg QID but relapsed   Hx benefit nortriptyline  with low blood level but relapsed.  So  increased to 150 mg HS but not doing well.    Discussed potential benefits, risks, and side effects of stimulants with patient to include increased heart rate, palpitations, insomnia, increased anxiety, increased irritability, or decreased appetite.  Instructed patient to contact office if experiencing any significant tolerability issues.  Extensive discussion of tx options including repeating TMS, Spravato, TCA, ECT  , MAOI,  lamotrigine.   Consider low dose lithium  with nortriptyline  consider selegiline, higher dose quetiapine ,   Disc SE each type of med including manic type sx.  Extensive disc of alternative Spravato.  07/28/22  On  100 mg HS. Amitriptyline  Not Estab. ng/mL <20    Nortriptyline  Not Estab. ng/mL 89  Amitriptyline  & Nortriptyline , Total 80 - 200 ng/mL   Total <109  nortriptyline  150 mg HS for tRD.   Add lithium  150 mg HS  (hx tremor on 300 but was on high stimulant at the time.)  Reduced Concerta  36 AM, bc recent ER visit with palpitations and shaking.  DT TR insomnia  continue quetiapine  75 HS .    Increase alprazolam  0.25 mg BID and 0.5 mg HS  Rec therapy but it's difficult given her location.  She'll resume remotely with Deane Sprang.  Ref ECT [facility] and appt 12/22/23 for consult.  Disc process and usual procedure to answer questions and statistics on response.  Given nortrip +lithium  is good combo post ECT will not change meds today  FU 4 weeks  Lorene Macintosh, MD, DFAPA   Future Appointments  Date Time Provider Department Center  01/19/2024  3:30 PM Cottle, Lorene KANDICE Raddle., MD [facility] None        No orders of the defined types were placed in this encounter.     -------------------------------

## 2023-12-19 ENCOUNTER — Other Ambulatory Visit: Payer: Self-pay | Admitting: Psychiatry

## 2023-12-19 DIAGNOSIS — F339 Major depressive disorder, recurrent, unspecified: Secondary | ICD-10-CM

## 2023-12-21 ENCOUNTER — Telehealth: Payer: Self-pay | Admitting: Psychiatry

## 2023-12-21 NOTE — Telephone Encounter (Signed)
 LVM to [facility].  New Rx for alprazolam  TID sent at appt on 11/21. New Rx for Concerta  36 mg also sent at 11/21 appt.

## 2023-12-21 NOTE — Telephone Encounter (Signed)
 Called patient again. Told her that new scripts were sent in 11/21 at her appt.

## 2023-12-21 NOTE — Telephone Encounter (Signed)
 Rachael Callahan lvm asking about her xanax . She said that she is supposed to take 2 in the am, 2 in bedtime. So she wants an increase. She also asked about her concerta   36 mg. She needs a refill on it and wants to know is she suppose to stay on it or be weaned off of it. Please give her a call at [phone]

## 2023-12-22 ENCOUNTER — Telehealth: Payer: Self-pay | Admitting: Psychiatry

## 2023-12-22 NOTE — Telephone Encounter (Signed)
 Please see message from patient. She was told yesterday that the methylphenidate  and alprazolam  RF were sent at her visit on 11/21.

## 2023-12-22 NOTE — Telephone Encounter (Signed)
 Pt Lvm @ 2:57p for Rachael Callahan to advise Dr Geoffry that she will get her first ECT treatment on Friday in [facility].  She said she will not take the lithium  but she wants to know if she should continue to the Methylphenidate  36mg  and if so, she needs more sent in.  Next appt 12/23

## 2023-12-23 ENCOUNTER — Other Ambulatory Visit: Payer: Self-pay | Admitting: Psychiatry

## 2023-12-23 NOTE — Telephone Encounter (Signed)
 She should continue Concerta  every day except days of ECT.  Concerta  and alprazolam  were sent on 11/21.  Please let her know both of these things.

## 2023-12-28 ENCOUNTER — Telehealth: Payer: Self-pay | Admitting: Psychiatry

## 2023-12-28 NOTE — Telephone Encounter (Signed)
 Pt LVM @ 12:57 oon 11/29 asking Dr Geoffry if Gabapentin would interact with her current medications.  She is having some recent lower back pain.  If not, she would like a script of it sent to   [id number] - [place], [place] - [address] [place] [address] Phone: [phone]  Fax: [phone]   Next appt 12/18

## 2023-12-28 NOTE — Telephone Encounter (Signed)
 Reviewed with her 3 times - do not take methylphenidate  on days she has ECT. Recommended she call her PCP or ortho for gabapentin.

## 2023-12-28 NOTE — Telephone Encounter (Signed)
 Patient called back and wanted to clarify a question. She wants to know if gabapentin would interact with any of her psychiatric drugs.

## 2024-01-01 ENCOUNTER — Telehealth: Payer: Self-pay | Admitting: Psychiatry

## 2024-01-01 NOTE — Telephone Encounter (Signed)
DUP

## 2024-01-01 NOTE — Telephone Encounter (Addendum)
 New Rx was sent on 11/21 for increased dose. Told pt I would call the pharmacy.  Pharmacy will fill today.

## 2024-01-01 NOTE — Telephone Encounter (Signed)
 Pt lvm requesting Rx Alprazolam .

## 2024-01-02 ENCOUNTER — Other Ambulatory Visit: Payer: Self-pay | Admitting: Psychiatry

## 2024-01-02 DIAGNOSIS — F5105 Insomnia due to other mental disorder: Secondary | ICD-10-CM

## 2024-01-19 ENCOUNTER — Ambulatory Visit: Admitting: Psychiatry

## 2024-01-19 ENCOUNTER — Encounter: Payer: Self-pay | Admitting: Psychiatry

## 2024-01-19 DIAGNOSIS — F339 Major depressive disorder, recurrent, unspecified: Secondary | ICD-10-CM | POA: Diagnosis not present

## 2024-01-19 DIAGNOSIS — F900 Attention-deficit hyperactivity disorder, predominantly inattentive type: Secondary | ICD-10-CM

## 2024-01-19 DIAGNOSIS — F5105 Insomnia due to other mental disorder: Secondary | ICD-10-CM | POA: Diagnosis not present

## 2024-01-19 DIAGNOSIS — G2581 Restless legs syndrome: Secondary | ICD-10-CM | POA: Diagnosis not present

## 2024-01-19 NOTE — Progress Notes (Signed)
 Rachael Callahan [id number] 1945/03/29 78 y.o.  Virtual Visit via Telephone Note  I connected with pt by telephone and verified that I am speaking with the correct person using two identifiers.   I discussed the limitations, risks, security and privacy concerns of performing an evaluation and management service by telephone and the availability of in person appointments. I also discussed with the patient that there may be a patient responsible charge related to this service. The patient expressed understanding and agreed to proceed.  I discussed the assessment and treatment plan with the patient. The patient was provided an opportunity to ask questions and all were answered. The patient agreed with the plan and demonstrated an understanding of the instructions.   The patient was advised to call back or seek an in-person evaluation if the symptoms worsen or if the condition fails to improve as anticipated.  I provided 30 minutes of non-face-to-face time during this encounter.  The patient was located at home in [facility] and the provider was located office.  Session 330-400 pm Difficult for patient to get here.   Subjective:   Patient ID:  Rachael Callahan is a 78 y.o. (DOB 12-25-1945) female.  Chief Complaint:  Chief Complaint  Patient presents with   Follow-up   Depression    Rachael Callahan presents to the office today for follow-up of TRD.  Prior addition of Concerta  had helped with depression and productivity and interest and alertness.  visit August 2020.  She was doing generally well and no meds were changed.  03/2019 appt with the following noted: BP up here but reports it has been normal at home.   No new problems.  Still good On Concerta  to 54 mg daily and duloxetine  120 mg since Jan 2020.  Has been doing well.  Overall the combination of meds is effective.  More productive and more interested and no longer depressed.  No longer napping.   Busy summer.  D in law brought 2 gkids from  [place] and that was great.  GS 8 years older than adopted GD and they do well together. Thinks TMS helped some also. RLS managed usually with daily pramipexole . Xanax  use a couple times per month. Often over concerns with grand-daughter's mother who causes problems. Minimal anxiety .  Sleep good and less napping.  Patient reports stable mood and denies depressed or irritable moods.  Patient denies any recent difficulty with anxiety.  Patient denies difficulty with sleep initiation or maintenance. Night owl and gets 7-8 hours.  No napping.   Denies appetite disturbance.  Patient reports that energy and motivation have been good.  Patient denies any difficulty with concentration.  Patient denies any suicidal ideation. Concerned about GD who's not doing well with psych treatment.  Had seen [name] before but transferred.  GD Bipolar disorder dx.  Seen at [facility].  Also had hosp.  Plan:  No meds changed  10/11/19 appt with the following noted: Covid free.  Vaccinated. Pretty good mood. Satisfied with meds otherwise. Last week problems with RLS after being stable for awhile.  No more caffeine than usual. No SE. Patient reports stable mood and denies depressed or irritable moods.  Patient denies any recent difficulty with anxiety.  Patient denies difficulty with sleep initiation or maintenance. Denies appetite disturbance.  Patient reports that energy and motivation have been good.  Patient denies any difficulty with concentration.  Patient denies any suicidal ideation. Uses Xanax  occ. [name] well. GD 26 and needs for  insurance to see doc in [facility]. Plan: no changes  04/11/2020 appointment with the following noted: Since here concerta  increased to 72 mg AM.  I think it's what I needed.  More motivated and energetic and productive but not normal. Still on duloxetine  120 without SE.   Mood good.  Patient reports stable mood and denies depressed or irritable moods.  Patient denies  any recent difficulty with anxiety.  Patient denies difficulty with sleep initiation or maintenance, but night owl so sleep is messed up with retirement. Denies appetite disturbance.  Patient reports that energy and motivation have been good.  Patient denies any difficulty with concentration.  Patient denies any suicidal ideation. RLS managed.   Plan :  No med changes and she agrees.  10/11/2020 appointment with the following noted: Xanax  3-4 times per month. RLS managed with pramipexole  and muscle relaxing cream. No sig depression.  Satisfied with meds.  Occ situational mood problems. Patient reports stable mood and denies depressed or irritable moods.  Patient denies any recent difficulty with anxiety.  Patient denies difficulty with sleep initiation or maintenance. Denies appetite disturbance.  Patient reports that energy and motivation have been good.  Patient denies any difficulty with concentration.  Patient denies any suicidal ideation. Stress GD married last year to cocaine addict. No SE Initial insomnia at night but can sleep better in the day. 1 cup AM coffee. Plan: No med changes  02/26/2021 phone call from husband and also discussed with patient.  MD note as follows: RTC  [phone] Rachael Callahan She's been in bed for a couple of days and doesn't feel life is worth living.  Not directly voiced SI.  Only like this for a couple of days.  She doesn't desire to go anywhere.  He knows of no trigger except ongoing family issues.  Disc with pt Felt bad for a couple of mos after virus in December.  Not Covid.  Cough ongoing.  Don't feel like doing anything.  No energy.  No interest.  Everything seems like too much to deal with.  No med changes. No SI but feels useless.  Seemed OK prior to virus. Change in Concerta  recently about a week or 2 ago.  Plan: Would prefer something that could have a fairly quick effect and not have to change the Cymbalta  given her history of doing well on it until  recently. She did not respond to Abilify or Rexulti but she might respond to Vraylar  1.5 mg every morning.  It is quick-acting and compatible with her duloxetine  and Concerta .  Disc discussed side effects.  She is not suicidal and does not need hospitalization at this time. Her husband will come pick up samples of Vraylar  1.5 mg 1 every morning starting tomorrow.  Rachael Macintosh, MD, [facility]  04/30/2021 appointment with the following noted: Several phone calls since she was here. Took Vryalar for a week and saw benefit witout SE and then stopped it. Felt fine for awhile.  Then had hard time last weekend and took another Vraylar  bc felt weepy. Today feels fine with mood..  Generally low anxiety. Rare alprazolam . Taking generic Concerta  without a problem. No SE. Not much dep in last 6 mos except as noted. GD pregnant with first [name].  Concerned about GD physical's and mental health. GD married.   GD sober. Plan: continue Concerta  72, duloxetine  120  05/21/21 took Vraylar  again for depression and felt better and rX sent.  Cost was a problem so PT assistance forms mailed  to her.  07/12/21 TC complaining of sleepiness and relating it to Vraylar .  Dose reduced to QOD  08/01/21 No show  10/03/21 TC complaining of depression. Sent Rx mirtazapine  30 mg HS.  Still taking Vraylar  1.5 QOD.  Was told to stop it since it was apparently no lonnger helping  12/10/21 appt noted:  seen with Rachael Callahan A lot of phone calls since here with depressive sx. Current psych meds: duloxetine  90, lithium  300, Ritalin  72 AM, mirtazapine  30. Depression worse in the last couple of week. SE lithium  tremor and jittery.  Therefore needing alprazolam  more often. Last free of dep about 6 weeks ago. Sleeping too much.  Sleep to escape depression.   Asks about Zoloft.   Plan: Plan: Stop mirtazapine  and lithium  Reduce duloxetine  to 60 mg daily for 1 week then 30 mg daily for 1 week then stop duloxetine  Start Auvelity  1 in the AM  for 1 week then 1 twice daily as long as dizziness is not a problem.  02/24/22 appt noted: Several phone calls since she was here.  Jittery on Auvelity  and stopped it. Referral sent for TMS. Then restarted Auvelity  and wanted a prescription February 07, 2022 Taking Auvelity  twice daily since here.   Meds: Auvelity  BID (AM & HS), Concerta  72 AM, pramipexole  1 mg HS.  No SE Didn't notice anything profound coming off other meds but does feel better generally.  Doesn't feel worse.  Stressful family sensation. Feels a little odd sometimes like I'm not totally engaged and don't have energy she wisshes long term.  Depression is better but not gone. Sleep is good.  Appetite good. No sig RLS with pramipexole . Planning to move to where D lives  in [facility] and build house. Enjoys theater and music. Plan: continue Auvelity  BID, Concerta  36 AM, pramipexole  1 mg HS Still being evaluated for TMS  04/29/22 appt noted: Several phone calls since here with various problems. Weaning TMS  and maybe somewhat helpful. Still so low energy.  Days of feeling sad withotu reason.  No recent crying spells-TMS helped.  Better motivation later in the day.  Not much interest or enjoyment.   No change off duloxetine  and on Auvelity . No SE now.   Plan: Plan: DT NR reduce Concerta  36 AM,  Off label for TRD increase pramipexole  to 0.5 mg AM and 1 mg HS Tapering TMS DC  AUVELITY  Start TCA nortriptyline  25 up to 75 mg HS  05/08/22 TC no benefit pramipexole  Plan:  Increase pramipexole  to 1 mg BID if tolerated if she has SE then split the AM dose to 1/2 in the AM and noon and 1 at night.     06/12/22 appt noted: Meds; concerta  36 AM, nortriptyline  75 HS, pramipexole  1 mg BID. Finished TMS end of April Seemed to level out with meds.  Not severe downs or crying in the last couple of weeks.  Tire easily.  Motivation so so.  A little better interest and enjoyment.  Not required to do much. Dep 4/10.   No SE noted except  dry mouth.  No N or dizziness.   Needs to lose wt.   No RLS.   Sleep ok.   Plan: continue Concerta  36 AM,  Off label for TRD increase pramipexole  to 1 mg TID  nortriptyline  75 mg HS  07/23/22 appt noted: Finished TMS end of April 2024 Meds as above. No SE except dry mouth.  No sleepiness or N or revved up. No benefit with increase  pramipexole .  Wants to feel better.  Andhedonia.  Didn't enjoy beach trip and should have.  Occ crying spells.    Good relationship with D and son in law.  Moving to live with them in [facility].  Looking forward to it. No RLS. Pending knee surgery 08/26/22 Sleep ok.   Plan: Off label for TRD increase pramipexole  to 1 mg QID DT NR or SE If NR in 2 weeks then call and will increase to max 5 mg daily. Check nortriptyline  level .    07/28/22  Amitriptyline  Not Estab. ng/mL <20    Nortriptyline  Not Estab. ng/mL 89  Amitriptyline  & Nortriptyline , Total 80 - 200 ng/mL   Total <109 Plan increase to 100 mg HS from 75 mg HS  09/23/22 appt noted; Psych med; Xanax  0.25 mg prn, Concerta  54 AM, nortrip 100 HS, pramipexole  1 mg QID,  SE dryness Had TKR on R 08/26/22.  No opiates. Feeling better but not sure why.  Pretty quickly after here.  Overall mood is better.  Dep 1-2/10 is clearly better.  Can enjoy some things esp great grand babies.   GD Ariel has baby Iris.  Doing well.   Asks about incr prn Xanax .  Needs some to help with sleep bc awakening for an hour.  Some related to knee discomfort. Rachael Callahan just retired.  Plan to sell house and move to [facility] where D located.  Plan: Plan: continue Concerta  36 AM,  nortriptyline  100 mg HS Off label for TRD increase pramipexole  to 1 mg QID helpful  11/27/22 appt noted: Psych med; Xanax  0.25 mg prn, Concerta  54 AM, nortrip 100 HS, pramipexole  1 mg QID (missing some),  SE dryness Still dealing with issues with TKR.  Doesn't think she is doing well enough.  Will have to go back to PT.  It is limiting activity and  movement.  Gained 12 # since surgery.   Mood so so , not great.   Working to put house on [employer].  Preparing for this has been difficult.   H has good attitude.   Asked about therapy Plan: med changes today; switch to ER to improve compliance pramipexole  4.5 mg daily, Concerta  54 AM, nortrip 100 HS,    Xanax  0.25 mg HS.  02/04/23 appt noted: Psych meds as above.  Consistent with meds.   SE dryness.   Mood pretty good overall.   Moved to [facility] and will purchase mobile home there near D.  Staying with D and son in law.  Moving is stressful.  Will be 6-8 mos until move complete DT construction time.   Rachael Callahan is supportive and stable. She thinks pramipexole  is helping.  Sleep can be mixed up with napping affecting night time sleep.  Getting enough sleep.  06/04/23 appt noted: Med: No med changes today; switched prampexole to ER with better compliance pramipexole  4.5 mg daily,  Concerta  54 AM, nortrip 100 HS,   Xanax  0.25 mg HS.   Really upset about being late and being told she couldn't be seen today.  Then pt after her cancelled and left.  And she could therefore be seen.  Drove from [facility].  Had trouble with GPS trying to get here.   Consistent. Fair .  A lot going on.  House being finished.  A lot to deal with. Can be productivity.   No SE Since knee surgery July gained 40#.  Seeing GP.  Started Ozempic.  No benefit.  Comfort food eating.  Mobility so so.  Doesn't feel like surgery helped.   No impulsivity , compulsivity. Can't think of any other concerns.   Living in D's house and it's OK.   Plan: no med changes.  Refer to therapy.  09/11/23 TC:  Pt reporting having trouble sleeping. Can usually get to sleep, but doesn't stay asleep. Estimates getting 5 hours. Lots of situational things. Rates depression 5-6/10, anxiety 4-5/10, but it fluctuates. Reports having a consultation to see about another sleep study with Dr. Modesto. H reports she snores. Abnormal TSH and lipids.    Reports can sleep with alprazolam , but takes more than prescribed in order to do that. Reports took 8 ZzzQuill one night last week because she needed to sleep and she slept, but when she woke up she was searching her house for family members who were no longer there. When asked about caffeine use she said she drank a lot of VIA.  I was not able to find this online to see what the caffeine content was. She was not home, but said it was in a plastic bottle, not the [employer] product.  MD resp:  Avoid caffeine after noon.  Ok to increase alprazolam  to 2-3 of the 0.25 mg tablets at bedtime.  If it doesn't work drop dose back down and call us .    Rachael Macintosh, MD, DFAPA  09/17/23 TC:  Pt reported still not sleeping thru the night with alprazolam  0.25 2-3 tablets. Reports she is up and down. Reports moved 6 months ago and said she doesn't feel like she has slept the entire night since the move, still unpacking. She said she felt like a new mother - sleep deprived.  Pended Rx earlier for alprazolam . Previously tried mirtazapine  but reported SE.     MD resP ;  Stop alprazolam  and start clonazepam  1 or 1 and 1/2 tablets at night.  Will take 15 min longer to kick in than does alprazolam .  Should keep her asleep longer.     Rachael Macintosh, MD, DFAPA       09/18/23 TC:  Pt took clonazepam  last night, took one tablet and took another 1/2 when she woke up at 2 AM. [place] she is still not sleeping, unable to function because she is so tired, is worsening her depression and she is angry and tearful. Reports that no one seems to understand that medications affect her differently. She reports still taking methylphenidate  54 mg in AM. She had reported earlier this week drinking a VIA drink, which does have some caffeine. Reported today she isn't drinking them that much. She is sleeping, but only about 5 hours today a day and she needs more. Denied daytime napping.     MD resp: Clonazepam  and alprazolam  cannot be  stopped abruptly. 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 If this works we will discuss weaning clonazepam  when I see her. Remind her of her upcoming appt and be sure to keep it. Rachael Macintosh, MD, DFAPA  09/29/23 TC:  Called pt to see how she slept with the addition of Seroquel  25 mg. She reported she didn't get any more sleep. Reports getting 2 hours of sleep. She did report after having breakfast she took a 2 hour nap.  She has a consultation for a sleep study on 9/2. She reported last week being irritable before she wasn't sleeping. She verbalized the same today, but her affect was brighter today than it was last week.  MD resp:  If she tolerated it ok then increase to 2-3 of the 25 mg Seroquel  tablets.  Rachael Macintosh, MD, DFAPA  09/29/23 appt noted:  Med: clonazepam  1 mg and quetiapine  50 mg HS, Concerta  54 AM, nortriptyline  100 mg , Pramipexole  ER 4.5 mg daily. No SE.  Consistent. Last night slept in 2 hour spurts with awakening in between.  Got 6 hours. After  More trouble with sleep for no reason but correlates with move to [facility]. Sleep study consultation tomorrow.  No consistent RLS .  Not weekly. Still dealing with depression.   A lot of changes in our lives.  I get aggivated I don't feel better than I do. If were not a Christian, I might have taken my own life.  Last not dep for over 2 weeks a few months ago.  No sense of purpose.   Get tired of taking meds.   Dep 8/10.  Just finished house and moved in in May.   Plan: Increase nortriptyline  150 mg HS for tRD.    10/23/23 appt noted: Med; Increase nortriptyline  150 mg HS 3 weeks,  clonazepam  1 mg and quetiapine  50 mg HS, Concerta  54 AM,  reduced Pramipexole  ER 2.25 mg daily. 3 different times of having dreams and awakening thinking maybe it was real.  Awoke confused but brief.   Mild improvements, not as depressed as I did.  3/10.  A little better conc.  Still low energy.  Has some  motivation to improve function.  No other SE.   Balance not great; worse in last couple of months.  No falls. Sleep still not as good as I like DT EMA but a little less than it did.  Can usually go back to sleep.  Skipped clonazepam  last couple of days.  Has occ napped in the afternoon. Plan: Increased nortriptyline  150 mg HS for tRD.   DT relapse of depression start weaning pramipexole  to ER every 2 weeks.  11/20/23 appt noted  Med:  nortriptyline  150 HS x 7 wks, no pramipexole , Concerta  54 mg AM, quetiapine  75 mg HS, alprazolam  prn ins or anxiety, no clonazepam  No problems off pramipexole .  Ok but something going on with my GD, Ariel.   She's asking for money.  Having trouble dealing with that.  Wondering if she should resume therapy to help me knowing how to deal with it.  Also D in law causing some problems too. Saw Deane Sprang.  Not sure if any effect from nortriptyline  150.   Function is ok at home.  Pretty normal.   would feel a lot better were it not for these things.   H supportive. Worry over family.   Some interests to some degree.   One of few interests is playing with gkids.  No hobbies.  Will enjoy and watch TV programs and enjoy.  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  Causes fear of driving bc can't feel her feet.   No death thoughts nor SI, I would never do that bc family and faith.  Dep 5/10.  I dont' like feeling this way Plan: have referred to ECT  12/18/23 appt noted:  nortriptyline  150 HS , Concerta  36 mg AM, quetiapine  75 mg HS, alprazolam  prn ins or anxiety & taking BID 0.5 mg .  Lithium  150 mg daily. Pt call or family called couple of times since last appt seeking other tx options; including 2 days ago at ER.  Went to ER with shaking and crying.   The depression is worse than anything. ECT team [facility] called 11/25 at 1 pm and has appt then.   Questions about ECT Needs Xanax  bc very anxious easily even doing simple  things.   Just issued CPAP for 2 nights.   01/19/24 appt noted: Med: nortriptyline  150 HS , Concerta  36 mg AM, quetiapine  75 mg HS, alprazolam  prn ins or anxiety & taking BID 0.5 mg .  Lithium  150 mg daily. ECT #9 at [facility].  Last ECT  yesterday For awhile felt really good and very helpful.  Then skipped Friday and woke Monday angry.  Weepy today.  Dr. Sherie and Dr. Bonner.   Se HA initially, better now.  Prone to HA.   Was doing well until skipped the one Friday.  Dep was better.     Psychiatric medications tried include  fluoxetine,  venlafaxine,  duloxetine  120 + Concerta  54, bupropion,  Trintellix, lithium ,  Mirtazapine  30 without help for depression Auvelity  NR Nortriptyline  150  pramipexole  1 QID  NR  Latuda 40 mg,  Rexulti 2 mg with no benefit,  Vraylar  1.5 had benefit and then lost it.  TMS helped, 2019  ; repeat 04/2022  F hosp for dep & ECT which worked.    Sister also with dep.   Review of Systems:  Review of Systems  Cardiovascular:  Negative for chest pain and palpitations.  Musculoskeletal:  Positive for arthralgias.  Neurological:  Negative for dizziness, tremors and weakness.       RLS off and on  Psychiatric/Behavioral:  Positive for dysphoric mood and sleep disturbance. Negative for agitation, behavioral problems, confusion, decreased concentration, hallucinations, self-injury and suicidal ideas. The patient is not nervous/anxious and is not hyperactive.     Medications: I have reviewed the patient's current medications.  Current Outpatient Medications  Medication Sig Dispense Refill   ALPRAZolam  (XANAX ) 0.25 MG tablet Take 1 tablet (0.25 mg total) by mouth in the morning, at noon, in the evening, and at bedtime. 120 tablet 0   Cholecalciferol 125 MCG (5000 UT) TABS Take 5,000 Units by mouth daily.     lithium  carbonate 150 MG capsule Take 1 capsule (150 mg total) by mouth at bedtime. 90 capsule 0   methocarbamol  (ROBAXIN ) 500 MG tablet Take 1 tablet  (500 mg total) by mouth every 6 (six) hours as needed for muscle spasms (muscle pain). 40 tablet 2   methylphenidate  36 MG PO CR tablet Take 1 tablet (36 mg total) by mouth daily. 30 tablet 0   metoprolol  succinate (TOPROL -XL) 50 MG 24 hr tablet Take 50 mg by mouth daily.     Multiple Vitamins-Minerals (MULTIVITAMIN ADULT PO) Take 1 tablet by mouth daily.     nortriptyline  (PAMELOR ) 50 MG capsule TAKE 3 CAPSULES (150 MG TOTAL) BY MOUTH AT BEDTIME. 270 capsule 0   olmesartan (BENICAR) 40 MG tablet Take 40 mg by mouth daily.     omeprazole (PRILOSEC) 40 MG capsule Take 40 mg by mouth daily.  1   OZEMPIC, 0.25 OR 0.5 MG/DOSE, 2 MG/3ML SOPN Inject 0.5 mg into the skin once a week.     polyethylene glycol (MIRALAX  / GLYCOLAX ) 17 g packet Take 17 g by mouth 2 (two) times daily. 14 each 0   pravastatin  (PRAVACHOL ) 20 MG tablet Take 20 mg by mouth at bedtime.  3   Probiotic Product (PROBIOTIC-10 PO) Take 1 tablet by mouth daily.     QUEtiapine  (SEROQUEL ) 25  MG tablet Take 3 tablets (75 mg total) by mouth at bedtime. 270 tablet 0   No current facility-administered medications for this visit.    Medication Side Effects: None  Allergies: No Known Allergies  Past Medical History:  Diagnosis Date   Arthritis    Depression    High cholesterol    Hypertension    Migraine    Stomach ulcer     History reviewed. No pertinent family history.  Social History   Socioeconomic History   Marital status: Married    Spouse name: Not on file   Number of children: Not on file   Years of education: Not on file   Highest education level: Not on file  Occupational History   Not on file  Tobacco Use   Smoking status: Never   Smokeless tobacco: Never  Vaping Use   Vaping status: Never Used  Substance and Sexual Activity   Alcohol use: Yes    Comment: rarely   Drug use: Never   Sexual activity: Not on file  Other Topics Concern   Not on file  Social History Narrative   Not on file   Social  Drivers of Health   Tobacco Use: Low Risk (01/25/2024)   Received from [facility]   Patient History    Smoking Tobacco Use: Never    Smokeless Tobacco Use: Never    Passive Exposure: Not on file  Financial Resource Strain: Not on file  Food Insecurity: No Food Insecurity (08/26/2022)   Hunger Vital Sign    Worried About Running Out of Food in the Last Year: Never true    Ran Out of Food in the Last Year: Never true  Transportation Needs: No Transportation Needs (08/26/2022)   PRAPARE - Administrator, Civil Service (Medical): No    Lack of Transportation (Non-Medical): No  Physical Activity: Not on file  Stress: Not on file  Social Connections: Not on file  Intimate Partner Violence: Not At Risk (08/26/2022)   Humiliation, Afraid, Rape, and Kick questionnaire    Fear of Current or Ex-Partner: No    Emotionally Abused: No    Physically Abused: No    Sexually Abused: No  Depression (PHQ2-9): High Risk (12/05/2022)   Depression (PHQ2-9)    PHQ-2 Score: 12  Alcohol Screen: Not on file  Housing: Low Risk (08/26/2022)   Housing    Last Housing Risk Score: 0  Utilities: Not At Risk (08/26/2022)   [employer] Utilities    Threatened with loss of utilities: No  Health Literacy: Not on file    Past Medical History, Surgical history, Social history, and Family history were reviewed and updated as appropriate.   Please see review of systems for further details on the patient's review from today.   Objective:   Physical Exam:  There were no vitals taken for this visit.  Physical Exam Constitutional:      General: She is not in acute distress.    Appearance: She is well-developed.  Musculoskeletal:        General: No deformity.  Neurological:     Mental Status: She is alert and oriented to person, place, and time.     Motor: No tremor.     Coordination: Coordination normal.     Gait: Gait normal.  Psychiatric:        Attention and Perception: Perception normal. She is  attentive.        Mood and Affect: Mood is anxious and depressed. Affect  is not labile, blunt, angry or inappropriate.        Speech: Speech normal.        Behavior: Behavior normal. Behavior is not slowed.        Thought Content: Thought content normal. Thought content is not delusional. Thought content does not include homicidal or suicidal ideation. Thought content does not include suicidal plan.        Cognition and Memory: Cognition normal.        Judgment: Judgment normal.     Comments: Insight intact. No auditory or visual hallucinations. No delusions.  Talkative without pressure Dep 8/10.  More anxious No death thoughts.  No SI      Lab Review:     Component Value Date/Time   NA 136 08/27/2022 0410   K 4.5 08/27/2022 0410   CL 103 08/27/2022 0410   CO2 25 08/27/2022 0410   GLUCOSE 182 (H) 08/27/2022 0410   BUN 13 08/27/2022 0410   CREATININE 0.74 08/27/2022 0410   CALCIUM 8.7 (L) 08/27/2022 0410   GFRNONAA >60 08/27/2022 0410   GFRAA >60 05/14/2017 0917       Component Value Date/Time   WBC 9.9 08/27/2022 0410   RBC 3.78 (L) 08/27/2022 0410   HGB 11.7 (L) 08/27/2022 0410   HCT 35.9 (L) 08/27/2022 0410   PLT 205 08/27/2022 0410   MCV 95.0 08/27/2022 0410   MCH 31.0 08/27/2022 0410   MCHC 32.6 08/27/2022 0410   RDW 13.0 08/27/2022 0410   LYMPHSABS 1.4 05/14/2017 0917   MONOABS 0.5 05/14/2017 0917   EOSABS 0.2 05/14/2017 0917   BASOSABS 0.0 05/14/2017 0917    07/28/22  on 75 mg daily. Amitriptyline  Not Estab. ng/mL <20    Nortriptyline  Not Estab. ng/mL 89  Amitriptyline  & Nortriptyline , Total 80 - 200 ng/mL   Total <109  09/15/23 TSH normal.   07/2023 B12 318   Assessment: Plan:    Rachael Callahan was seen today for follow-up and depression.  Diagnoses and all orders for this visit:  Recurrent major depression resistant to treatment  Attention deficit hyperactivity disorder (ADHD), predominantly inattentive type  Insomnia due to mental  condition  Restless legs syndrome      30 min phone time with patient .  severe depression in Jan 2023 getting better then worse.  But has had a number of bouts of depression in 2023 and since.  Overall seems to have high tolerance of meds.   Was better with pramipexole  1 mg QID but relapsed   Hx benefit nortriptyline  with low blood level but relapsed.  So increased to 150 mg HS but not doing well.    Discussed potential benefits, risks, and side effects of stimulants with patient to include increased heart rate, palpitations, insomnia, increased anxiety, increased irritability, or decreased appetite.  Instructed patient to contact office if experiencing any significant tolerability issues.  Extensive discussion of tx options including repeating TMS, Spravato, TCA, ECT  , MAOI,  lamotrigine.   Consider low dose lithium  with nortriptyline  consider selegiline, higher dose quetiapine ,   Disc SE each type of med including manic type sx.  Extensive disc of alternative Spravato.  07/28/22  On  100 mg HS. Amitriptyline  Not Estab. ng/mL <20    Nortriptyline  Not Estab. ng/mL 89  Amitriptyline  & Nortriptyline , Total 80 - 200 ng/mL   Total <109  nortriptyline  150 mg HS for tRD.   Add lithium  150 mg HS  (hx tremor on 300 but was on high stimulant at  the time.)  consider increasing for TRD.  Disc issues with ECT  Reduced Concerta  36 AM, bc recent ER visit with palpitations and shaking.  DT TR insomnia  continue quetiapine  75 HS .    Increase alprazolam  0.25 mg BID and 0.5 mg HS  Rec therapy but it's difficult given her location.  She'll resume remotely with Deane Sprang.  Ref ECT [facility] and appt 12/22/23 for consult.  Disc process and usual procedure to answer questions and statistics on response.   Given nortrip +lithium  is good combo post ECT will not change meds today  FU 4 weeks  Rachael Macintosh, MD, DFAPA   Future Appointments  Date Time Provider Department Center   02/16/2024 10:00 AM Cottle, Rachael KANDICE Raddle., MD [facility] None         No orders of the defined types were placed in this encounter.     -------------------------------

## 2024-02-02 ENCOUNTER — Other Ambulatory Visit: Payer: Self-pay | Admitting: Psychiatry

## 2024-02-02 DIAGNOSIS — F339 Major depressive disorder, recurrent, unspecified: Secondary | ICD-10-CM

## 2024-02-02 MED ORDER — METHYLPHENIDATE HCL ER (OSM) 36 MG PO TBCR: 36.0000 mg | EXTENDED_RELEASE_TABLET | Freq: Every day | ORAL | 0 refills | Status: AC

## 2024-02-02 NOTE — Telephone Encounter (Signed)
 Pended methylphenidate  and message sent separately related to ECT/depression.

## 2024-02-02 NOTE — Telephone Encounter (Signed)
 Patient lvm at 11:26 for refill on Methylphenidate  36mg . Ph: [phone] Appt 1/20 Pharmacy [facility] [address] [place], [place]. Prentiss Hammett mentioned that she had an ECT done in [facility] yesterday and because it wasn't effective they sent her to another hosptial. She waited in the ER for hours but left and wasn't seen. Pls rtc

## 2024-02-02 NOTE — Telephone Encounter (Addendum)
 Pt is reporting she isn't seeing benefit from ECT. Reports she was told to go to [facility] ER yesterday for some testing. Was in the ER for 6 hours and only vitals taken. Husband wants to investigate another source for sx. She is scheduled for 2 more treatments this week. She sounded brighter than she has lately. I asked how she was sleeping with the CPAP and she said she takes it off because it is uncomfortable, said they want her to get at least 4 hours in and she reports she is doing that. She doesn't have any increased energy. Does report neuropathy is very troubling for her and no one has help for her. I pended methylphenidate  RF.

## 2024-02-16 ENCOUNTER — Ambulatory Visit (INDEPENDENT_AMBULATORY_CARE_PROVIDER_SITE_OTHER): Admitting: Psychiatry

## 2024-02-16 ENCOUNTER — Encounter: Payer: Self-pay | Admitting: Psychiatry

## 2024-02-16 DIAGNOSIS — F339 Major depressive disorder, recurrent, unspecified: Secondary | ICD-10-CM | POA: Diagnosis not present

## 2024-02-16 DIAGNOSIS — G2581 Restless legs syndrome: Secondary | ICD-10-CM | POA: Diagnosis not present

## 2024-02-16 DIAGNOSIS — F900 Attention-deficit hyperactivity disorder, predominantly inattentive type: Secondary | ICD-10-CM | POA: Diagnosis not present

## 2024-02-16 DIAGNOSIS — F5105 Insomnia due to other mental disorder: Secondary | ICD-10-CM | POA: Diagnosis not present

## 2024-02-16 MED ORDER — PRAMIPEXOLE DIHYDROCHLORIDE 0.5 MG PO TABS: 0.5000 mg | ORAL_TABLET | Freq: Every evening | ORAL | 1 refills | Status: AC

## 2024-02-16 NOTE — Progress Notes (Signed)
 Rachael Callahan [id number] 22-May-1945 79 y.o.  Virtual Visit via Telephone Note  I connected with pt by telephone and verified that I am speaking with the correct person using two identifiers.   I discussed the limitations, risks, security and privacy concerns of performing an evaluation and management service by telephone and the availability of in person appointments. I also discussed with the patient that there may be a patient responsible charge related to this service. The patient expressed understanding and agreed to proceed.  I discussed the assessment and treatment plan with the patient. The patient was provided an opportunity to ask questions and all were answered. The patient agreed with the plan and demonstrated an understanding of the instructions.   The patient was advised to call back or seek an in-person evaluation if the symptoms worsen or if the condition fails to improve as anticipated.  I provided 30 minutes of non-face-to-face time during this encounter.  The patient was located at home in [facility] and the provider was located office.  Session 1000-1030pm Difficult for patient to get here.   Subjective:   Patient ID:  Rachael Callahan is a 79 y.o. (DOB 02-Jan-1946) female.  Chief Complaint:  Chief Complaint  Patient presents with   Follow-up   Depression   Medication Reaction   Fatigue    Rachael Callahan presents to the office today for follow-up of TRD.  Prior addition of Concerta  had helped with depression and productivity and interest and alertness.  visit August 2020.  She was doing generally well and no meds were changed.  03/2019 appt with the following noted: BP up here but reports it has been normal at home.   No new problems.  Still good On Concerta  to 54 mg daily and duloxetine  120 mg since Jan 2020.  Has been doing well.  Overall the combination of meds is effective.  More productive and more interested and no longer depressed.  No longer napping.   Busy  summer.  D in law brought 2 gkids from [place] and that was great.  GS 8 years older than adopted Rachael Callahan and they do well together. Thinks TMS helped some also. RLS managed usually with daily pramipexole . Xanax  use a couple times per month. Often over concerns with grand-daughter's mother who causes problems. Minimal anxiety .  Sleep good and less napping.  Patient reports stable mood and denies depressed or irritable moods.  Patient denies any recent difficulty with anxiety.  Patient denies difficulty with sleep initiation or maintenance. Night owl and gets 7-8 hours.  No napping.   Denies appetite disturbance.  Patient reports that energy and motivation have been good.  Patient denies any difficulty with concentration.  Patient denies any suicidal ideation. Concerned about Rachael Callahan who's not doing well with psych treatment.  Had seen Rachael Callahan before but transferred.  Rachael Callahan Bipolar disorder dx.  [facility].  Also had hosp.  Plan:  No meds changed  10/11/19 appt with the following noted: Covid free.  Vaccinated. Pretty good mood. Satisfied with meds otherwise. Last week problems with RLS after being stable for awhile.  No more caffeine than usual. No SE. Patient reports stable mood and denies depressed or irritable moods.  Patient denies any recent difficulty with anxiety.  Patient denies difficulty with sleep initiation or maintenance. Denies appetite disturbance.  Patient reports that energy and motivation have been good.  Patient denies any difficulty with concentration.  Patient denies any suicidal ideation. Uses Xanax  occ. [name]  well. Rachael Callahan 27 and needs for insurance to see doc in [facility]. Plan: no changes  04/11/2020 appointment with the following noted: Since here concerta  increased to 72 mg AM.  I think it's what I needed.  More motivated and energetic and productive but not normal. Still on duloxetine  120 without SE.   Mood good.  Patient reports stable mood and denies depressed  or irritable moods.  Patient denies any recent difficulty with anxiety.  Patient denies difficulty with sleep initiation or maintenance, but night owl so sleep is messed up with retirement. Denies appetite disturbance.  Patient reports that energy and motivation have been good.  Patient denies any difficulty with concentration.  Patient denies any suicidal ideation. RLS managed.   Plan :  No med changes and she agrees.  10/11/2020 appointment with the following noted: Xanax  3-4 times per month. RLS managed with pramipexole  and muscle relaxing cream. No sig depression.  Satisfied with meds.  Occ situational mood problems. Patient reports stable mood and denies depressed or irritable moods.  Patient denies any recent difficulty with anxiety.  Patient denies difficulty with sleep initiation or maintenance. Denies appetite disturbance.  Patient reports that energy and motivation have been good.  Patient denies any difficulty with concentration.  Patient denies any suicidal ideation. Stress Rachael Callahan married last year to cocaine addict. No SE Initial insomnia at night but can sleep better in the day. 1 cup AM coffee. Plan: No med changes  02/26/2021 phone call from husband and also discussed with patient.  MD note as follows: RTC  [id number] Rachael Callahan She's been in bed for a couple of days and doesn't feel life is worth living.  Not directly voiced SI.  Only like this for a couple of days.  She doesn't desire to go anywhere.  He knows of no trigger except ongoing family issues.  Disc with pt Felt bad for a couple of mos after virus in December.  Not Covid.  Cough ongoing.  Don't feel like doing anything.  No energy.  No interest.  Everything seems like too much to deal with.  No med changes. No SI but feels useless.  Seemed OK prior to virus. Change in Concerta  recently about a week or 2 ago.  Plan: Would prefer something that could have a fairly quick effect and not have to change the Cymbalta  given her  history of doing well on it until recently. She did not respond to Abilify or Rexulti but she might respond to Vraylar  1.5 mg every morning.  It is quick-acting and compatible with her duloxetine  and Concerta .  Disc discussed side effects.  She is not suicidal and does not need hospitalization at this time. Her husband will come pick up samples of Vraylar  1.5 mg 1 every morning starting tomorrow.  Lorene Macintosh, MD, [facility]  04/30/2021 appointment with the following noted: Several phone calls since she was here. Took Vryalar for a week and saw benefit witout SE and then stopped it. Felt fine for awhile.  Then had hard time last weekend and took another Vraylar  bc felt weepy. Today feels fine with mood..  Generally low anxiety. Rare alprazolam . Taking generic Concerta  without a problem. No SE. Not much dep in last 6 mos except as noted. Rachael Callahan pregnant with first [name].  Concerned about Rachael Callahan physical's and mental health. Rachael Callahan married.   Rachael Callahan sober. Plan: continue Concerta  72, duloxetine  120  05/21/21 took Vraylar  again for depression and felt better and rX sent.  Cost was a  problem so PT assistance forms mailed to her.  07/12/21 TC complaining of sleepiness and relating it to Vraylar .  Dose reduced to QOD  08/01/21 No show  10/03/21 TC complaining of depression. Sent Rx mirtazapine  30 mg HS.  Still taking Vraylar  1.5 QOD.  Was told to stop it since it was apparently no lonnger helping  12/10/21 appt noted:  seen with Rachael Callahan A lot of phone calls since here with depressive sx. Current psych meds: duloxetine  90, lithium  300, Ritalin  72 AM, mirtazapine  30. Depression worse in the last couple of week. SE lithium  tremor and jittery.  Therefore needing alprazolam  more often. Last free of dep about 6 weeks ago. Sleeping too much.  Sleep to escape depression.   Asks about Zoloft.   Plan: Plan: Stop mirtazapine  and lithium  Reduce duloxetine  to 60 mg daily for 1 week then 30 mg daily for 1 week then stop  duloxetine  Start Auvelity  1 in the AM for 1 week then 1 twice daily as long as dizziness is not a problem.  02/24/22 appt noted: Several phone calls since she was here.  Jittery on Auvelity  and stopped it. Referral sent for TMS. Then restarted Auvelity  and wanted a prescription February 07, 2022 Taking Auvelity  twice daily since here.   Meds: Auvelity  BID (AM & HS), Concerta  72 AM, pramipexole  1 mg HS.  No SE Didn't notice anything profound coming off other meds but does feel better generally.  Doesn't feel worse.  Stressful family sensation. Feels a little odd sometimes like I'm not totally engaged and don't have energy she wisshes long term.  Depression is better but not gone. Sleep is good.  Appetite good. No sig RLS with pramipexole . Planning to move to where D lives  in [facility] and build house. Enjoys theater and music. Plan: continue Auvelity  BID, Concerta  36 AM, pramipexole  1 mg HS Still being evaluated for TMS  04/29/22 appt noted: Several phone calls since here with various problems. Weaning TMS  and maybe somewhat helpful. Still so low energy.  Days of feeling sad withotu reason.  No recent crying spells-TMS helped.  Better motivation later in the day.  Not much interest or enjoyment.   No change off duloxetine  and on Auvelity . No SE now.   Plan: Plan: DT NR reduce Concerta  36 AM,  Off label for TRD increase pramipexole  to 0.5 mg AM and 1 mg HS Tapering TMS DC  AUVELITY  Start TCA nortriptyline  25 up to 75 mg HS  05/08/22 TC no benefit pramipexole  Plan:  Increase pramipexole  to 1 mg BID if tolerated if she has SE then split the AM dose to 1/2 in the AM and noon and 1 at night.     06/12/22 appt noted: Meds; concerta  36 AM, nortriptyline  75 HS, pramipexole  1 mg BID. Finished TMS end of April Seemed to level out with meds.  Not severe downs or crying in the last couple of weeks.  Tire easily.  Motivation so so.  A little better interest and enjoyment.  Not required to do  much. Dep 4/10.   No SE noted except dry mouth.  No N or dizziness.   Needs to lose wt.   No RLS.   Sleep ok.   Plan: continue Concerta  36 AM,  Off label for TRD increase pramipexole  to 1 mg TID  nortriptyline  75 mg HS  07/23/22 appt noted: Finished TMS end of April 2024 Meds as above. No SE except dry mouth.  No sleepiness or N or  revved up. No benefit with increase pramipexole .  Wants to feel better.  Andhedonia.  Didn't enjoy beach trip and should have.  Occ crying spells.    Good relationship with D and son in law.  Moving to live with them in [facility].  Looking forward to it. No RLS. Pending knee surgery 08/26/22 Sleep ok.   Plan: Off label for TRD increase pramipexole  to 1 mg QID DT NR or SE If NR in 2 weeks then call and will increase to max 5 mg daily. Check nortriptyline  level .    07/28/22  Amitriptyline  Not Estab. ng/mL <20    Nortriptyline  Not Estab. ng/mL 89  Amitriptyline  & Nortriptyline , Total 80 - 200 ng/mL   Total <109 Plan increase to 100 mg HS from 75 mg HS  09/23/22 appt noted; Psych med; Xanax  0.25 mg prn, Concerta  54 AM, nortrip 100 HS, pramipexole  1 mg QID,  SE dryness Had TKR on R 08/26/22.  No opiates. Feeling better but not sure why.  Pretty quickly after here.  Overall mood is better.  Dep 1-2/10 is clearly better.  Can enjoy some things esp great grand babies.   Rachael Callahan Ariel has baby Iris.  Doing well.   Asks about incr prn Xanax .  Needs some to help with sleep bc awakening for an hour.  Some related to knee discomfort. Rachael Callahan just retired.  Plan to sell house and move to [facility] where D located.  Plan: Plan: continue Concerta  36 AM,  nortriptyline  100 mg HS Off label for TRD increase pramipexole  to 1 mg QID helpful  11/27/22 appt noted: Psych med; Xanax  0.25 mg prn, Concerta  54 AM, nortrip 100 HS, pramipexole  1 mg QID (missing some),  SE dryness Still dealing with issues with TKR.  Doesn't think she is doing well enough.  Will have to go  back to PT.  It is limiting activity and movement.  Gained 12 # since surgery.   Mood so so , not great.   Working to put house on [employer].  Preparing for this has been difficult.   H has good attitude.   Asked about therapy Plan: med changes today; switch to ER to improve compliance pramipexole  4.5 mg daily, Concerta  54 AM, nortrip 100 HS,    Xanax  0.25 mg HS.  02/04/23 appt noted: Psych meds as above.  Consistent with meds.   SE dryness.   Mood pretty good overall.   Moved to [facility] and will purchase mobile home there near D.  Staying with D and son in law.  Moving is stressful.  Will be 6-8 mos until move complete DT construction time.   Rachael Callahan is supportive and stable. She thinks pramipexole  is helping.  Sleep can be mixed up with napping affecting night time sleep.  Getting enough sleep.  06/04/23 appt noted: Med: No med changes today; switched prampexole to ER with better compliance pramipexole  4.5 mg daily,  Concerta  54 AM, nortrip 100 HS,   Xanax  0.25 mg HS.   Really upset about being late and being told she couldn't be seen today.  Then pt after her cancelled and left.  And she could therefore be seen.  Drove from [facility].  Had trouble with GPS trying to get here.   Consistent. Fair .  A lot going on.  House being finished.  A lot to deal with. Can be productivity.   No SE Since knee surgery July gained 40#.  Seeing GP.  Started Ozempic.  No benefit.  Comfort food eating.  Mobility so so.  Doesn't feel like surgery helped.   No impulsivity , compulsivity. Can't think of any other concerns.   Living in D's house and it's OK.   Plan: no med changes.  Refer to therapy.  09/11/23 TC:  Pt reporting having trouble sleeping. Can usually get to sleep, but doesn't stay asleep. Estimates getting 5 hours. Lots of situational things. Rates depression 5-6/10, anxiety 4-5/10, but it fluctuates. Reports having a consultation to see about another sleep study with Dr. Modesto. H reports she  snores. Abnormal TSH and lipids.   Reports can sleep with alprazolam , but takes more than prescribed in order to do that. Reports took 8 ZzzQuill one night last week because she needed to sleep and she slept, but when she woke up she was searching her house for family members who were no longer there. When asked about caffeine use she said she drank a lot of VIA.  I was not able to find this online to see what the caffeine content was. She was not home, but said it was in a plastic bottle, not the [employer] product.  MD resp:  Avoid caffeine after noon.  Ok to increase alprazolam  to 2-3 of the 0.25 mg tablets at bedtime.  If it doesn't work drop dose back down and call us .    Lorene Macintosh, MD, [facility]AP[facility]  09/17/23 TC:  Pt reported still not sleeping thru the night with alprazolam  0.25 2-3 tablets. Reports she is up and down. Reports moved 6 months ago and said she doesn't feel like she has slept the entire night since the move, still unpacking. She said she felt like a new mother - sleep deprived.  Pended Rx earlier for alprazolam . Previously tried mirtazapine  but reported SE.     MD resP ;  Stop alprazolam  and start clonazepam  1 or 1 and 1/2 tablets at night.  Will take 15 min longer to kick in than does alprazolam .  Should keep her asleep longer.     Lorene Macintosh, MD, DFAPA       09/18/23 TC:  Pt took clonazepam  last night, took one tablet and took another 1/2 when she woke up at 2 AM. [place] she is still not sleeping, unable to function because she is so tired, is worsening her depression and she is angry and tearful. Reports that no one seems to understand that medications affect her differently. She reports still taking methylphenidate  54 mg in AM. She had reported earlier this week drinking a VIA drink, which does have some caffeine. Reported today she isn't drinking them that much. She is sleeping, but only about 5 hours today a day and she needs more. Denied daytime napping.     MD resp:  Clonazepam  and alprazolam  cannot be stopped abruptly. 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 If this works we will discuss weaning clonazepam  when I see her. Remind her of her upcoming appt and be sure to keep it. Lorene Macintosh, MD, [facility]AP[facility]  09/29/23 TC:  Called pt to see how she slept with the addition of Seroquel  25 mg. She reported she didn't get any more sleep. Reports getting 2 hours of sleep. She did report after having breakfast she took a 2 hour nap.  She has a consultation for a sleep study on 9/2. She reported last week being irritable before she wasn't sleeping. She verbalized the same today, but her affect was  brighter today than it was last week.     MD resp:  If she tolerated it ok then increase to 2-3 of the 25 mg Seroquel  tablets.  Lorene Macintosh, MD, DFAP[facility]  09/29/23 appt noted:  Med: clonazepam  1 mg and quetiapine  50 mg HS, Concerta  54 AM, nortriptyline  100 mg , Pramipexole  ER 4.5 mg daily. No SE.  Consistent. Last night slept in 2 hour spurts with awakening in between.  Got 6 hours. After  More trouble with sleep for no reason but correlates with move to [facility]. Sleep study consultation tomorrow.  No consistent RLS .  Not weekly. Still dealing with depression.   A lot of changes in our lives.  I get aggivated I don't feel better than I do. If were not a Christian, I might have taken my own life.  Last not dep for over 2 weeks a few months ago.  No sense of purpose.   Get tired of taking meds.   Dep 8/10.  Just finished house and moved in in May.   Plan: Increase nortriptyline  150 mg HS for tRD.    10/23/23 appt noted: Med; Increase nortriptyline  150 mg HS 3 weeks,  clonazepam  1 mg and quetiapine  50 mg HS, Concerta  54 AM,  reduced Pramipexole  ER 2.25 mg daily. 3 different times of having dreams and awakening thinking maybe it was real.  Awoke confused but brief.   Mild improvements, not as depressed as I did.  3/10.  A little better conc.   Still low energy.  Has some motivation to improve function.  No other SE.   Balance not great; worse in last couple of months.  No falls. Sleep still not as good as I like DT EMA but a little less than it did.  Can usually go back to sleep.  Skipped clonazepam  last couple of days.  Has occ napped in the afternoon. Plan: Increased nortriptyline  150 mg HS for tRD.   DT relapse of depression start weaning pramipexole  to ER every 2 weeks.  11/20/23 appt noted  Med:  nortriptyline  150 HS x 7 wks, no pramipexole , Concerta  54 mg AM, quetiapine  75 mg HS, alprazolam  prn ins or anxiety, no clonazepam  No problems off pramipexole .  Ok but something going on with my Rachael Callahan, Ariel.   She's asking for money.  Having trouble dealing with that.  Wondering if she should resume therapy to help me knowing how to deal with it.  Also D in law causing some problems too. Saw Deane Sprang.  Not sure if any effect from nortriptyline  150.   Function is ok at home.  Pretty normal.   would feel a lot better were it not for these things.   H supportive. Worry over family.   Some interests to some degree.   One of few interests is playing with gkids.  No hobbies.  Will enjoy and watch TV programs and enjoy.  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  Causes fear of driving bc can't feel her feet.   No death thoughts nor SI, I would never do that bc family and faith.  Dep 5/10.  I dont' like feeling this way Plan: have referred to ECT  12/18/23 appt noted:  nortriptyline  150 HS , Concerta  36 mg AM, quetiapine  75 mg HS, alprazolam  prn ins or anxiety & taking BID 0.5 mg .  Lithium  150 mg daily. Pt call or family called couple of times since last  appt seeking other tx options; including 2 days ago at ER.  Went to ER with shaking and crying.   The depression is worse than anything. ECT team [facility] called 11/25 at 1 pm and has appt then.   Questions about ECT Needs Xanax  bc very  anxious easily even doing simple things.   Just issued CPAP for 2 nights.   01/19/24 appt noted: Med: nortriptyline  150 HS , Concerta  36 mg AM, quetiapine  75 mg HS, alprazolam  prn ins or anxiety & taking BID 0.5 mg .  Lithium  150 mg daily. ECT #9 at [facility].  Last ECT  yesterday For awhile felt really good and very helpful.  Then skipped Friday and woke Monday angry.  Weepy today.  Dr. Sherie and Dr. Bonner.   Se HA initially, better now.  Prone to HA.   Was doing well until skipped the one Friday.  Dep was better.    02/16/24 appt noted: Med: Med: nortriptyline  150 HS , Concerta  36 mg AM, quetiapine  75 mg HS, alprazolam  prn ins or anxiety & taking BID 0.5 mgprn and not as much . NO Lithium  150 mg daily. ECT  02/10/24 twice weekly at [facility].  PHQ-9 23.  Likes Dr. KANDICE is her favorite.   Some benefit from ECT , generally feel better.  Family notices some benefit.  Not crying all the time.  More interested in going places.   Not much memory trouble with ECT.   No known SE with nortrip Sleep doc and she disc RLS.  Suggested gabapentin.   DISC CPAP.  Will typically take off in middle of the night. Will nap if she can easily.     Psychiatric medications tried include  fluoxetine,  venlafaxine,  duloxetine  120 + Concerta  54, bupropion,  Trintellix, lithium ,  Mirtazapine  30 without help for depression Auvelity  NR Nortriptyline  150  pramipexole  0.5 mg prn 90 mins before onset RLS No caffeine after lunch.   Latuda 40 mg,  Rexulti 2 mg with no benefit,  Vraylar  1.5 had benefit and then lost it.  TMS helped, 2019  ; repeat 04/2022  F hosp for dep & ECT which worked.    Sister also with dep.   Review of Systems:  Review of Systems  Cardiovascular:  Negative for chest pain and palpitations.  Musculoskeletal:  Positive for arthralgias.  Neurological:  Negative for dizziness, tremors and weakness.       RLS off and on  Psychiatric/Behavioral:  Positive for dysphoric mood and sleep  disturbance. Negative for agitation, behavioral problems, confusion, decreased concentration, hallucinations, self-injury and suicidal ideas. The patient is not nervous/anxious and is not hyperactive.     Medications: I have reviewed the patient's current medications.  Current Outpatient Medications  Medication Sig Dispense Refill   ALPRAZolam  (XANAX ) 0.25 MG tablet Take 1 tablet (0.25 mg total) by mouth in the morning, at noon, in the evening, and at bedtime. 120 tablet 0   Cholecalciferol 125 MCG (5000 UT) TABS Take 5,000 Units by mouth daily.     lithium  carbonate 150 MG capsule Take 1 capsule (150 mg total) by mouth at bedtime. 90 capsule 0   methocarbamol  (ROBAXIN ) 500 MG tablet Take 1 tablet (500 mg total) by mouth every 6 (six) hours as needed for muscle spasms (muscle pain). 40 tablet 2   methylphenidate  36 MG PO CR tablet Take 1 tablet (36 mg total) by mouth daily. 30 tablet 0   metoprolol  succinate (TOPROL -XL) 50 MG 24 hr tablet Take 50  mg by mouth daily.     Multiple Vitamins-Minerals (MULTIVITAMIN ADULT PO) Take 1 tablet by mouth daily.     nortriptyline  (PAMELOR ) 50 MG capsule TAKE 3 CAPSULES (150 MG TOTAL) BY MOUTH AT BEDTIME. 270 capsule 0   olmesartan (BENICAR) 40 MG tablet Take 40 mg by mouth daily.     omeprazole (PRILOSEC) 40 MG capsule Take 40 mg by mouth daily.  1   OZEMPIC, 0.25 OR 0.5 MG/DOSE, 2 MG/3ML SOPN Inject 0.5 mg into the skin once a week.     polyethylene glycol (MIRALAX  / GLYCOLAX ) 17 g packet Take 17 g by mouth 2 (two) times daily. 14 each 0   pravastatin  (PRAVACHOL ) 20 MG tablet Take 20 mg by mouth at bedtime.  3   Probiotic Product (PROBIOTIC-10 PO) Take 1 tablet by mouth daily.     QUEtiapine  (SEROQUEL ) 25 MG tablet Take 3 tablets (75 mg total) by mouth at bedtime. 270 tablet 0   pramipexole  (MIRAPEX ) 0.5 MG tablet Take 1 tablet (0.5 mg total) by mouth every evening. (Patient not taking: Reported on 02/16/2024) 30 tablet 1   No current facility-administered  medications for this visit.    Medication Side Effects: None  Allergies: No Known Allergies  Past Medical History:  Diagnosis Date   Arthritis    Depression    High cholesterol    Hypertension    Migraine    Stomach ulcer     History reviewed. No pertinent family history.  Social History   Socioeconomic History   Marital status: Married    Spouse name: Not on file   Number of children: Not on file   Years of education: Not on file   Highest education level: Not on file  Occupational History   Not on file  Tobacco Use   Smoking status: Never   Smokeless tobacco: Never  Vaping Use   Vaping status: Never Used  Substance and Sexual Activity   Alcohol use: Yes    Comment: rarely   Drug use: Never   Sexual activity: Not on file  Other Topics Concern   Not on file  Social History Narrative   Not on file   Social Drivers of Health   Tobacco Use: Low Risk (02/16/2024)   Patient History    Smoking Tobacco Use: Never    Smokeless Tobacco Use: Never    Passive Exposure: Not on file  Financial Resource Strain: Not on file  Food Insecurity: No Food Insecurity (08/26/2022)   Hunger Vital Sign    Worried About Running Out of Food in the Last Year: Never true    Ran Out of Food in the Last Year: Never true  Transportation Needs: No Transportation Needs (08/26/2022)   PRAPARE - Administrator, Civil Service (Medical): No    Lack of Transportation (Non-Medical): No  Physical Activity: Not on file  Stress: Not on file  Social Connections: Not on file  Intimate Partner Violence: Not At Risk (08/26/2022)   Humiliation, Afraid, Rape, and Kick questionnaire    Fear of Current or Ex-Partner: No    Emotionally Abused: No    Physically Abused: No    Sexually Abused: No  Depression (PHQ2-9): High Risk (12/05/2022)   Depression (PHQ2-9)    PHQ-2 Score: 12  Alcohol Screen: Not on file  Housing: Low Risk (08/26/2022)   Housing    Last Housing Risk Score: 0   Utilities: Not At Risk (08/26/2022)   [facility] Utilities    Threatened  with loss of utilities: No  Health Literacy: Not on file    Past Medical History, Surgical history, Social history, and Family history were reviewed and updated as appropriate.   Please see review of systems for further details on the patient's review from today.   Objective:   Physical Exam:  There were no vitals taken for this visit.  Physical Exam Neurological:     Mental Status: She is alert and oriented to person, place, and time.     Cranial Nerves: No dysarthria.  Psychiatric:        Attention and Perception: Attention and perception normal.        Mood and Affect: Mood is depressed.        Speech: Speech normal.        Behavior: Behavior is cooperative.        Thought Content: Thought content normal. Thought content is not paranoid or delusional. Thought content does not include homicidal or suicidal ideation. Thought content does not include suicidal plan.        Cognition and Memory: Cognition and memory normal.        Judgment: Judgment normal.     Comments: Insight intact Tends to be mildly scattered.     Lab Review:     Component Value Date/Time   NA 136 08/27/2022 0410   K 4.5 08/27/2022 0410   CL 103 08/27/2022 0410   CO2 25 08/27/2022 0410   GLUCOSE 182 (H) 08/27/2022 0410   BUN 13 08/27/2022 0410   CREATININE 0.74 08/27/2022 0410   CALCIUM 8.7 (L) 08/27/2022 0410   GFRNONAA >60 08/27/2022 0410   GFRAA >60 05/14/2017 0917       Component Value Date/Time   WBC 9.9 08/27/2022 0410   RBC 3.78 (L) 08/27/2022 0410   HGB 11.7 (L) 08/27/2022 0410   HCT 35.9 (L) 08/27/2022 0410   PLT 205 08/27/2022 0410   MCV 95.0 08/27/2022 0410   MCH 31.0 08/27/2022 0410   MCHC 32.6 08/27/2022 0410   RDW 13.0 08/27/2022 0410   LYMPHSABS 1.4 05/14/2017 0917   MONOABS 0.5 05/14/2017 0917   EOSABS 0.2 05/14/2017 0917   BASOSABS 0.0 05/14/2017 0917    07/28/22  on 75 mg daily. Amitriptyline  Not  Estab. ng/mL <20    Nortriptyline  Not Estab. ng/mL 89  Amitriptyline  & Nortriptyline , Total 80 - 200 ng/mL   Total <109  09/15/23 TSH normal.   07/2023 B12 318  Sleep study 11/17/23 which revealed OSA with AHI=8.1/hour with O2 saturation nadir 84%.    Assessment: Plan:    Delight was seen today for follow-up, depression, medication reaction and fatigue.  Diagnoses and all orders for this visit:  Recurrent major depression resistant to treatment -     Nortriptyline  (Aventyl ), Serum [[facility]]  Attention deficit hyperactivity disorder (ADHD), predominantly inattentive type  Insomnia due to mental condition  Restless legs syndrome -     pramipexole  (MIRAPEX ) 0.5 MG tablet; Take 1 tablet (0.5 mg total) by mouth every evening. (Patient not taking: Reported on 02/16/2024)       30 min phone time with patient .  severe depression in Jan 2023 getting better then worse.  But has had a number of bouts of depression in 2023 and since.  Overall seems to have high tolerance of meds.   Was better with pramipexole  1 mg QID but relapsed   Hx benefit nortriptyline  with low blood level but relapsed.  So increased to 150 mg HS but not  doing well.    Discussed potential benefits, risks, and side effects of stimulants with patient to include increased heart rate, palpitations, insomnia, increased anxiety, increased irritability, or decreased appetite.  Instructed patient to contact office if experiencing any significant tolerability issues.  Extensive discussion of tx options including repeating TMS, Spravato, TCA-done, ECT  , MAOI,  lamotrigine.   Consider low dose lithium  with nortriptyline  consider selegiline, higher dose quetiapine ,   Disc SE each type of med including manic type sx.  Extensive disc of alternative Spravato.  07/28/22  On  100 mg HS. Amitriptyline  Not Estab. ng/mL <20    Nortriptyline  Not Estab. ng/mL 89  Amitriptyline  & Nortriptyline , Total 80 - 200 ng/mL    Total <109  nortriptyline  150 mg HS for tRD.   Will repeat level and see if can incrrease to 200 mg given limited other options  ECT team not agreeing to Lithium  augmentation  Reduced Concerta  36 AM, bc recent ER visit with palpitations and shaking.  DT TR insomnia  continue quetiapine  75 HS .    alprazolam  0.25 mg BID and 0.5 mg HS  Rec therapy but it's difficult given her location.  She'll resume remotely with Deane Sprang.  continue ECT [facility].  Disc process and usual procedure to answer questions and statistics on response.   Given nortrip +lithium  is good combo post ECT will not change meds today  Disc OSA and her appt.  CPAP being adjusted.    Disc ECT and RLS.  Gabapentin might be a problem with ECT.  Disc alternatives. pramipexole  0.5 mg prn 90 mins before onset RLS No caffeine after lunch.   FU 4 weeks  Lorene Macintosh, MD, DFAPA   No future appointments.        Orders Placed This Encounter  Procedures   Nortriptyline  (Aventyl ), Serum [[facility]]      -------------------------------

## 2024-02-16 NOTE — Patient Instructions (Addendum)
 Pramipexole  0.5 mg prn in evening for restless legs.  Get nortriptyline  blood level at [facility].  If a problem call back and we can send to [place] lab

## 2024-03-03 ENCOUNTER — Other Ambulatory Visit: Payer: Self-pay | Admitting: Psychiatry

## 2024-03-03 DIAGNOSIS — F5105 Insomnia due to other mental disorder: Secondary | ICD-10-CM

## 2024-03-18 ENCOUNTER — Telehealth: Admitting: Psychiatry
